# Patient Record
Sex: Female | Born: 1950
Health system: Southern US, Community
[De-identification: ages and names within clinical notes are randomized; demographics above are authoritative.]

## PROBLEM LIST (undated history)

## (undated) DIAGNOSIS — F32A Depression, unspecified: Secondary | ICD-10-CM

## (undated) DIAGNOSIS — I10 Essential (primary) hypertension: Secondary | ICD-10-CM

## (undated) DIAGNOSIS — E785 Hyperlipidemia, unspecified: Secondary | ICD-10-CM

## (undated) DIAGNOSIS — R7303 Prediabetes: Secondary | ICD-10-CM

## (undated) DIAGNOSIS — K219 Gastro-esophageal reflux disease without esophagitis: Secondary | ICD-10-CM

## (undated) DIAGNOSIS — M797 Fibromyalgia: Secondary | ICD-10-CM

## (undated) DIAGNOSIS — M1990 Unspecified osteoarthritis, unspecified site: Secondary | ICD-10-CM

## (undated) DIAGNOSIS — T148XXA Other injury of unspecified body region, initial encounter: Secondary | ICD-10-CM

## (undated) DIAGNOSIS — I2510 Atherosclerotic heart disease of native coronary artery without angina pectoris: Secondary | ICD-10-CM

## (undated) DIAGNOSIS — U071 COVID-19: Secondary | ICD-10-CM

## (undated) DIAGNOSIS — S301XXA Contusion of abdominal wall, initial encounter: Secondary | ICD-10-CM

## (undated) DIAGNOSIS — J189 Pneumonia, unspecified organism: Secondary | ICD-10-CM

## (undated) HISTORY — DX: Other injury of unspecified body region, initial encounter: T14.8XXA

## (undated) HISTORY — DX: Atherosclerotic heart disease of native coronary artery without angina pectoris: I25.10

## (undated) HISTORY — PX: CATARACT EXTRACTION: SUR2

## (undated) HISTORY — DX: Contusion of abdominal wall, initial encounter: S30.1XXA

## (undated) HISTORY — PX: KNEE ARTHROSCOPY: SUR90

## (undated) HISTORY — DX: Essential (primary) hypertension: I10

## (undated) HISTORY — PX: TONSILLECTOMY: SUR1361

## (undated) HISTORY — PX: CARDIAC CATHETERIZATION: SHX172

## (undated) HISTORY — PX: CHOLECYSTECTOMY: SHX55

## (undated) HISTORY — PX: ANGIOPLASTY: SHX39

## (undated) HISTORY — PX: CARPAL TUNNEL RELEASE: SHX101

## (undated) HISTORY — DX: Prediabetes: R73.03

## (undated) HISTORY — PX: ABDOMINAL HYSTERECTOMY: SUR658

## (undated) HISTORY — DX: Morbid (severe) obesity due to excess calories: E66.01

## (undated) HISTORY — PX: OTHER SURGICAL HISTORY: SHX169

## (undated) HISTORY — DX: Hyperlipidemia, unspecified: E78.5

## (undated) HISTORY — DX: Fibromyalgia: M79.7

## (undated) HISTORY — DX: Unspecified osteoarthritis, unspecified site: M19.90

## (undated) HISTORY — PX: BUNIONECTOMY: SHX129

---

## 1999-03-21 ENCOUNTER — Other Ambulatory Visit: Admission: RE | Admit: 1999-03-21 | Discharge: 1999-03-21 | Payer: Self-pay | Admitting: Family Medicine

## 1999-08-17 ENCOUNTER — Ambulatory Visit (HOSPITAL_COMMUNITY): Admission: RE | Admit: 1999-08-17 | Discharge: 1999-08-17 | Payer: Self-pay | Admitting: Family Medicine

## 1999-08-17 ENCOUNTER — Encounter: Payer: Self-pay | Admitting: Family Medicine

## 1999-08-22 ENCOUNTER — Encounter (INDEPENDENT_AMBULATORY_CARE_PROVIDER_SITE_OTHER): Payer: Self-pay | Admitting: Specialist

## 1999-08-22 ENCOUNTER — Other Ambulatory Visit: Admission: RE | Admit: 1999-08-22 | Discharge: 1999-08-22 | Payer: Self-pay | Admitting: Obstetrics and Gynecology

## 1999-11-09 ENCOUNTER — Encounter (INDEPENDENT_AMBULATORY_CARE_PROVIDER_SITE_OTHER): Payer: Self-pay | Admitting: Specialist

## 1999-11-09 ENCOUNTER — Ambulatory Visit (HOSPITAL_COMMUNITY): Admission: RE | Admit: 1999-11-09 | Discharge: 1999-11-09 | Payer: Self-pay | Admitting: Obstetrics and Gynecology

## 2000-07-10 ENCOUNTER — Inpatient Hospital Stay (HOSPITAL_COMMUNITY): Admission: RE | Admit: 2000-07-10 | Discharge: 2000-07-13 | Payer: Self-pay | Admitting: Obstetrics and Gynecology

## 2000-07-10 ENCOUNTER — Encounter (INDEPENDENT_AMBULATORY_CARE_PROVIDER_SITE_OTHER): Payer: Self-pay | Admitting: Specialist

## 2000-08-19 ENCOUNTER — Encounter: Payer: Self-pay | Admitting: Obstetrics and Gynecology

## 2000-08-19 ENCOUNTER — Ambulatory Visit (HOSPITAL_COMMUNITY): Admission: RE | Admit: 2000-08-19 | Discharge: 2000-08-19 | Payer: Self-pay | Admitting: Obstetrics and Gynecology

## 2001-07-18 ENCOUNTER — Inpatient Hospital Stay (HOSPITAL_COMMUNITY): Admission: EM | Admit: 2001-07-18 | Discharge: 2001-07-19 | Payer: Self-pay | Admitting: Emergency Medicine

## 2001-07-18 ENCOUNTER — Encounter: Payer: Self-pay | Admitting: Emergency Medicine

## 2001-09-18 ENCOUNTER — Encounter: Admission: RE | Admit: 2001-09-18 | Discharge: 2001-10-16 | Payer: Self-pay | Admitting: Family Medicine

## 2001-12-29 ENCOUNTER — Other Ambulatory Visit: Admission: RE | Admit: 2001-12-29 | Discharge: 2001-12-29 | Payer: Self-pay | Admitting: Obstetrics and Gynecology

## 2002-09-10 ENCOUNTER — Encounter: Payer: Self-pay | Admitting: Family Medicine

## 2002-09-10 ENCOUNTER — Ambulatory Visit (HOSPITAL_COMMUNITY): Admission: RE | Admit: 2002-09-10 | Discharge: 2002-09-10 | Payer: Self-pay | Admitting: Family Medicine

## 2003-07-01 ENCOUNTER — Encounter: Payer: Self-pay | Admitting: Family Medicine

## 2003-07-01 ENCOUNTER — Encounter: Admission: RE | Admit: 2003-07-01 | Discharge: 2003-07-01 | Payer: Self-pay | Admitting: Family Medicine

## 2003-07-07 ENCOUNTER — Ambulatory Visit (HOSPITAL_BASED_OUTPATIENT_CLINIC_OR_DEPARTMENT_OTHER): Admission: RE | Admit: 2003-07-07 | Discharge: 2003-07-07 | Payer: Self-pay | Admitting: *Deleted

## 2003-12-13 ENCOUNTER — Emergency Department (HOSPITAL_COMMUNITY): Admission: AD | Admit: 2003-12-13 | Discharge: 2003-12-13 | Payer: Self-pay | Admitting: Family Medicine

## 2004-01-06 ENCOUNTER — Emergency Department (HOSPITAL_COMMUNITY): Admission: AD | Admit: 2004-01-06 | Discharge: 2004-01-06 | Payer: Self-pay | Admitting: Family Medicine

## 2004-01-31 ENCOUNTER — Emergency Department (HOSPITAL_COMMUNITY): Admission: EM | Admit: 2004-01-31 | Discharge: 2004-01-31 | Payer: Self-pay | Admitting: Family Medicine

## 2004-07-11 ENCOUNTER — Ambulatory Visit (HOSPITAL_COMMUNITY): Admission: RE | Admit: 2004-07-11 | Discharge: 2004-07-11 | Payer: Self-pay | Admitting: *Deleted

## 2004-07-21 ENCOUNTER — Emergency Department (HOSPITAL_COMMUNITY): Admission: EM | Admit: 2004-07-21 | Discharge: 2004-07-21 | Payer: Self-pay | Admitting: Family Medicine

## 2004-12-17 ENCOUNTER — Emergency Department (HOSPITAL_COMMUNITY): Admission: EM | Admit: 2004-12-17 | Discharge: 2004-12-17 | Payer: Self-pay | Admitting: Family Medicine

## 2005-01-25 ENCOUNTER — Emergency Department (HOSPITAL_COMMUNITY): Admission: EM | Admit: 2005-01-25 | Discharge: 2005-01-25 | Payer: Self-pay | Admitting: Family Medicine

## 2005-02-27 ENCOUNTER — Encounter: Admission: RE | Admit: 2005-02-27 | Discharge: 2005-05-28 | Payer: Self-pay | Admitting: Family Medicine

## 2005-08-31 ENCOUNTER — Ambulatory Visit (HOSPITAL_COMMUNITY): Admission: RE | Admit: 2005-08-31 | Discharge: 2005-08-31 | Payer: Self-pay | Admitting: Family Medicine

## 2005-11-12 ENCOUNTER — Emergency Department (HOSPITAL_COMMUNITY): Admission: EM | Admit: 2005-11-12 | Discharge: 2005-11-12 | Payer: Self-pay | Admitting: Family Medicine

## 2005-12-27 ENCOUNTER — Emergency Department (HOSPITAL_COMMUNITY): Admission: EM | Admit: 2005-12-27 | Discharge: 2005-12-27 | Payer: Self-pay | Admitting: Family Medicine

## 2006-02-07 ENCOUNTER — Emergency Department (HOSPITAL_COMMUNITY): Admission: EM | Admit: 2006-02-07 | Discharge: 2006-02-07 | Payer: Self-pay | Admitting: Family Medicine

## 2006-04-04 ENCOUNTER — Emergency Department (HOSPITAL_COMMUNITY): Admission: EM | Admit: 2006-04-04 | Discharge: 2006-04-04 | Payer: Self-pay | Admitting: Family Medicine

## 2006-06-17 ENCOUNTER — Ambulatory Visit (HOSPITAL_BASED_OUTPATIENT_CLINIC_OR_DEPARTMENT_OTHER): Admission: RE | Admit: 2006-06-17 | Discharge: 2006-06-17 | Payer: Self-pay | Admitting: Orthopedic Surgery

## 2006-06-17 ENCOUNTER — Encounter (INDEPENDENT_AMBULATORY_CARE_PROVIDER_SITE_OTHER): Payer: Self-pay | Admitting: Orthopedic Surgery

## 2006-07-07 ENCOUNTER — Encounter: Payer: Self-pay | Admitting: Vascular Surgery

## 2006-07-07 ENCOUNTER — Emergency Department (HOSPITAL_COMMUNITY): Admission: EM | Admit: 2006-07-07 | Discharge: 2006-07-07 | Payer: Self-pay | Admitting: Emergency Medicine

## 2006-09-27 ENCOUNTER — Ambulatory Visit (HOSPITAL_COMMUNITY): Admission: RE | Admit: 2006-09-27 | Discharge: 2006-09-27 | Payer: Self-pay | Admitting: Family Medicine

## 2006-11-18 ENCOUNTER — Emergency Department (HOSPITAL_COMMUNITY): Admission: EM | Admit: 2006-11-18 | Discharge: 2006-11-18 | Payer: Self-pay | Admitting: Emergency Medicine

## 2006-12-01 ENCOUNTER — Emergency Department (HOSPITAL_COMMUNITY): Admission: EM | Admit: 2006-12-01 | Discharge: 2006-12-01 | Payer: Self-pay | Admitting: Family Medicine

## 2007-02-23 ENCOUNTER — Emergency Department (HOSPITAL_COMMUNITY): Admission: EM | Admit: 2007-02-23 | Discharge: 2007-02-23 | Payer: Self-pay | Admitting: Family Medicine

## 2007-03-26 ENCOUNTER — Ambulatory Visit (HOSPITAL_COMMUNITY): Admission: RE | Admit: 2007-03-26 | Discharge: 2007-03-26 | Payer: Self-pay | Admitting: Orthopedic Surgery

## 2007-04-03 ENCOUNTER — Encounter: Admission: RE | Admit: 2007-04-03 | Discharge: 2007-04-03 | Payer: Self-pay | Admitting: Orthopedic Surgery

## 2007-04-15 ENCOUNTER — Encounter: Admission: RE | Admit: 2007-04-15 | Discharge: 2007-04-15 | Payer: Self-pay | Admitting: Orthopedic Surgery

## 2007-05-07 ENCOUNTER — Emergency Department (HOSPITAL_COMMUNITY): Admission: EM | Admit: 2007-05-07 | Discharge: 2007-05-07 | Payer: Self-pay | Admitting: Emergency Medicine

## 2007-05-15 ENCOUNTER — Encounter: Admission: RE | Admit: 2007-05-15 | Discharge: 2007-05-15 | Payer: Self-pay | Admitting: Orthopedic Surgery

## 2007-06-25 ENCOUNTER — Emergency Department (HOSPITAL_COMMUNITY): Admission: EM | Admit: 2007-06-25 | Discharge: 2007-06-25 | Payer: Self-pay | Admitting: Family Medicine

## 2007-09-18 ENCOUNTER — Emergency Department (HOSPITAL_COMMUNITY): Admission: EM | Admit: 2007-09-18 | Discharge: 2007-09-18 | Payer: Self-pay | Admitting: Emergency Medicine

## 2007-09-20 ENCOUNTER — Emergency Department (HOSPITAL_COMMUNITY): Admission: EM | Admit: 2007-09-20 | Discharge: 2007-09-20 | Payer: Self-pay | Admitting: Emergency Medicine

## 2007-09-30 ENCOUNTER — Ambulatory Visit (HOSPITAL_COMMUNITY): Admission: RE | Admit: 2007-09-30 | Discharge: 2007-09-30 | Payer: Self-pay | Admitting: Family Medicine

## 2008-02-11 ENCOUNTER — Emergency Department (HOSPITAL_COMMUNITY): Admission: EM | Admit: 2008-02-11 | Discharge: 2008-02-12 | Payer: Self-pay | Admitting: Emergency Medicine

## 2008-03-11 ENCOUNTER — Ambulatory Visit (HOSPITAL_COMMUNITY): Admission: RE | Admit: 2008-03-11 | Discharge: 2008-03-11 | Payer: Self-pay | Admitting: Family Medicine

## 2008-03-12 ENCOUNTER — Ambulatory Visit (HOSPITAL_COMMUNITY): Admission: RE | Admit: 2008-03-12 | Discharge: 2008-03-12 | Payer: Self-pay | Admitting: Family Medicine

## 2008-04-13 ENCOUNTER — Emergency Department (HOSPITAL_COMMUNITY): Admission: EM | Admit: 2008-04-13 | Discharge: 2008-04-13 | Payer: Self-pay | Admitting: Emergency Medicine

## 2008-05-09 ENCOUNTER — Emergency Department (HOSPITAL_COMMUNITY): Admission: EM | Admit: 2008-05-09 | Discharge: 2008-05-09 | Payer: Self-pay | Admitting: Family Medicine

## 2008-06-19 ENCOUNTER — Emergency Department (HOSPITAL_COMMUNITY): Admission: EM | Admit: 2008-06-19 | Discharge: 2008-06-19 | Payer: Self-pay | Admitting: Family Medicine

## 2008-08-28 ENCOUNTER — Emergency Department (HOSPITAL_COMMUNITY): Admission: EM | Admit: 2008-08-28 | Discharge: 2008-08-28 | Payer: Self-pay | Admitting: Emergency Medicine

## 2008-10-19 ENCOUNTER — Ambulatory Visit (HOSPITAL_COMMUNITY): Admission: RE | Admit: 2008-10-19 | Discharge: 2008-10-20 | Payer: Self-pay | Admitting: General Surgery

## 2008-10-19 ENCOUNTER — Encounter (INDEPENDENT_AMBULATORY_CARE_PROVIDER_SITE_OTHER): Payer: Self-pay | Admitting: General Surgery

## 2008-12-29 ENCOUNTER — Encounter: Admission: RE | Admit: 2008-12-29 | Discharge: 2008-12-29 | Payer: Self-pay | Admitting: Family Medicine

## 2009-02-10 ENCOUNTER — Emergency Department (HOSPITAL_COMMUNITY): Admission: EM | Admit: 2009-02-10 | Discharge: 2009-02-10 | Payer: Self-pay | Admitting: Family Medicine

## 2009-03-02 ENCOUNTER — Ambulatory Visit (HOSPITAL_BASED_OUTPATIENT_CLINIC_OR_DEPARTMENT_OTHER): Admission: RE | Admit: 2009-03-02 | Discharge: 2009-03-02 | Payer: Self-pay | Admitting: Orthopedic Surgery

## 2009-05-20 ENCOUNTER — Emergency Department (HOSPITAL_COMMUNITY): Admission: EM | Admit: 2009-05-20 | Discharge: 2009-05-20 | Payer: Self-pay | Admitting: Emergency Medicine

## 2009-10-11 ENCOUNTER — Observation Stay (HOSPITAL_COMMUNITY): Admission: EM | Admit: 2009-10-11 | Discharge: 2009-10-12 | Payer: Self-pay | Admitting: Emergency Medicine

## 2009-10-11 ENCOUNTER — Telehealth (INDEPENDENT_AMBULATORY_CARE_PROVIDER_SITE_OTHER): Payer: Self-pay | Admitting: Radiology

## 2009-10-11 ENCOUNTER — Ambulatory Visit: Payer: Self-pay | Admitting: Cardiovascular Disease

## 2009-10-12 ENCOUNTER — Ambulatory Visit: Payer: Self-pay

## 2009-10-12 ENCOUNTER — Ambulatory Visit: Payer: Self-pay | Admitting: Cardiovascular Disease

## 2009-10-21 ENCOUNTER — Emergency Department (HOSPITAL_COMMUNITY): Admission: EM | Admit: 2009-10-21 | Discharge: 2009-10-21 | Payer: Self-pay | Admitting: Family Medicine

## 2009-11-06 ENCOUNTER — Emergency Department (HOSPITAL_COMMUNITY): Admission: EM | Admit: 2009-11-06 | Discharge: 2009-11-06 | Payer: Self-pay | Admitting: Family Medicine

## 2010-01-22 ENCOUNTER — Emergency Department (HOSPITAL_COMMUNITY): Admission: EM | Admit: 2010-01-22 | Discharge: 2010-01-22 | Payer: Self-pay | Admitting: Family Medicine

## 2010-01-27 ENCOUNTER — Encounter: Admission: RE | Admit: 2010-01-27 | Discharge: 2010-01-27 | Payer: Self-pay | Admitting: Family Medicine

## 2010-02-12 ENCOUNTER — Emergency Department (HOSPITAL_COMMUNITY): Admission: EM | Admit: 2010-02-12 | Discharge: 2010-02-12 | Payer: Self-pay | Admitting: Emergency Medicine

## 2010-04-21 ENCOUNTER — Emergency Department (HOSPITAL_COMMUNITY): Admission: EM | Admit: 2010-04-21 | Discharge: 2010-04-21 | Payer: Self-pay | Admitting: Emergency Medicine

## 2010-12-28 ENCOUNTER — Other Ambulatory Visit (HOSPITAL_COMMUNITY): Payer: Self-pay | Admitting: Family Medicine

## 2010-12-28 DIAGNOSIS — Z1231 Encounter for screening mammogram for malignant neoplasm of breast: Secondary | ICD-10-CM

## 2010-12-31 ENCOUNTER — Encounter: Payer: Self-pay | Admitting: Orthopedic Surgery

## 2011-01-29 ENCOUNTER — Ambulatory Visit (HOSPITAL_COMMUNITY): Payer: 59 | Attending: Family Medicine

## 2011-02-28 ENCOUNTER — Other Ambulatory Visit: Payer: Self-pay | Admitting: Family Medicine

## 2011-02-28 DIAGNOSIS — Z1231 Encounter for screening mammogram for malignant neoplasm of breast: Secondary | ICD-10-CM

## 2011-03-07 ENCOUNTER — Ambulatory Visit
Admission: RE | Admit: 2011-03-07 | Discharge: 2011-03-07 | Disposition: A | Discharge status: Home / Self Care | Payer: 59 | Source: Ambulatory Visit | Attending: Family Medicine | Admitting: Family Medicine

## 2011-03-07 DIAGNOSIS — Z1231 Encounter for screening mammogram for malignant neoplasm of breast: Secondary | ICD-10-CM

## 2011-03-14 LAB — GLUCOSE, CAPILLARY
Glucose-Capillary: 112 mg/dL — ABNORMAL HIGH (ref 70–99)
Glucose-Capillary: 115 mg/dL — ABNORMAL HIGH (ref 70–99)
Glucose-Capillary: 131 mg/dL — ABNORMAL HIGH (ref 70–99)

## 2011-03-14 LAB — POCT CARDIAC MARKERS
Myoglobin, poc: 52.6 ng/mL (ref 12–200)
Troponin i, poc: <0.05 ng/mL (ref 0.00–0.09)

## 2011-03-14 LAB — CARDIAC PANEL(CRET KIN+CKTOT+MB+TROPI)
CK, MB: 1.4 ng/mL (ref 0.3–4.0)
Total CK: 31 U/L (ref 7–177)
Troponin I: 0.01 ng/mL (ref 0.00–0.06)

## 2011-03-14 LAB — BASIC METABOLIC PANEL
BUN: 21 mg/dL (ref 6–23)
Calcium: 8.9 mg/dL (ref 8.4–10.5)
GFR calc non Af Amer: >60 mL/min (ref >60)

## 2011-03-14 LAB — POCT URINALYSIS DIP (DEVICE)
Bilirubin Urine: NEGATIVE
Nitrite: NEGATIVE
Specific Gravity, Urine: 1.015 (ref 1.005–1.030)
Urobilinogen, UA: 1 mg/dL (ref 0.0–1.0)
pH: 5 (ref 5.0–8.0)

## 2011-03-14 LAB — D-DIMER, QUANTITATIVE
D-Dimer, Quant: <0.22 ug/mL-FEU (ref 0.00–0.48)
D-Dimer, Quant: <0.22 ug/mL-FEU (ref 0.00–0.48)

## 2011-03-14 LAB — DIFFERENTIAL
Eosinophils Absolute: 0 10*3/uL (ref 0.0–0.7)
Eosinophils Relative: 0 % (ref 0–5)
Lymphs Abs: 2.3 10*3/uL (ref 0.7–4.0)
Monocytes Relative: 8 % (ref 3–12)

## 2011-03-14 LAB — LIPID PANEL
HDL: 45 mg/dL (ref >39)
Total CHOL/HDL Ratio: 3.5 RATIO

## 2011-03-14 LAB — CBC
Hemoglobin: 14.6 g/dL (ref 12.0–15.0)
MCHC: 35.4 g/dL (ref 30.0–36.0)
MCV: 86.6 fL (ref 78.0–100.0)
Platelets: 236 10*3/uL (ref 150–400)
WBC: 9.7 10*3/uL (ref 4.0–10.5)

## 2011-03-22 LAB — BASIC METABOLIC PANEL
BUN: 20 mg/dL (ref 6–23)
Chloride: 106 mEq/L (ref 96–112)
Creatinine, Ser: 0.74 mg/dL (ref 0.4–1.2)
GFR calc non Af Amer: >60 mL/min (ref >60)

## 2011-03-22 LAB — POCT HEMOGLOBIN-HEMACUE: Hemoglobin: 13.6 g/dL (ref 12.0–15.0)

## 2011-04-24 NOTE — Op Note (Signed)
Courtney Steele, Courtney Steele                 ACCOUNT NO.:  [id number]   MEDICAL RECORD NO.:  [id number]          PATIENT TYPE:  AMB   LOCATION:  DSC                          FACILITY:  [facility]   PHYSICIAN:  John L. Rendall, M.D.  DATE OF BIRTH:  Dec 15, 1950   DATE OF PROCEDURE:  03/02/2009  DATE OF DISCHARGE:                               OPERATIVE REPORT   INDICATION AND JUSTIFICATION FOR PROCEDURE:  Chronic numbness and  discomfort, right elbow and arm with the EMGs and NCVs showing severe  ulnar nerve compression at the elbow with both sensory and motor loss.   JUSTIFICATION FOR OUTPATIENT SETTING:  Inpatient not required.   PREOPERATIVE DIAGNOSIS:  Tardy ulnar palsy severe, right elbow.   SURGICAL PROCEDURE:  Release of ulnar nerve through cubital tunnel.   POSTOPERATIVE DIAGNOSIS:  Tardy ulnar palsy severe, right elbow.   SURGEON:  John L. Rendall, MD   ASSISTANT:  Legrand Pitts. Duffy, PAC, present and participating in the entire  procedure.   ANESTHESIA:  General.   PROCEDURE:  Under general anesthesia, the right arm was prepared with  DuraPrep and draped as a sterile field.  The tourniquet is applied  before the start of prep and drape for excellent hemostasis.  It is  elevated at 250 mm.  An incision is made centered over the cubital  tunnel just slightly medially and dissection is carried 2 inches  proximal and 2 inches distal to the cubital tunnel.  Dissection is done  with 2.5 power of magnification.  The subcutaneous tissue is sharply  divided down to fascia.  At this point, the fascia over the cubital  tunnel was opened just distal to it.  Dissection is carried down into  the flexor carpi ulnaris, and the nerve is found passing into the FCU.  A rubber band sterile is placed about the nerve to help mobilize.  Dissection is then carried proximally using the 2.5 power loupes and the  fascia over the cubital tunnel is released.  The nerve was found to be  severely compressed just as  it exits the tunnel and there is some  discoloration.  Going proximally, the nerve is down to about what I  would guessed to be half the normal size.  As it enters a cubital  tunnel, another compression is seen.  Once it is released within several  minutes, the nerve seems to expand by at least a third.  Dissection is  then carried proximally into the triceps following the nerve to assure  no other constrictions.  Care was taken to go distally further to make  sure none are  present in the FCU fascia.  Once this was completed, the deep subcu  tissue was closed over the nerve, which is left in the groove with 0  Vicryl, subcuticular 2-0 Vicryl, and skin clips, and a sterile dressing.  The patient returned to recovery in good condition.      John L. Rendall, M.D.  Electronically Signed     JLR/MEDQ  D:  03/02/2009  T:  03/02/2009  Job:  [id number]

## 2011-04-24 NOTE — Op Note (Signed)
NAMELASHAUNDRA, LEHRMANN                 ACCOUNT NO.:  [id number]   MEDICAL RECORD NO.:  [id number]          PATIENT TYPE:  OIB   LOCATION:  1621                         FACILITY:  [facility]   PHYSICIAN:  Sharlet Salina T. Hoxworth, M.D.DATE OF BIRTH:  01/09/1951   DATE OF PROCEDURE:  10/19/2008  DATE OF DISCHARGE:                               OPERATIVE REPORT   PREOPERATIVE DIAGNOSES:  1. Cholelithiasis.  2. Cholecystitis.   POSTOPERATIVE DIAGNOSIS:  1. Cholelithiasis.  2. Cholecystitis.   SURGICAL PROCEDURES:  Laparoscopic cholecystectomy with intraoperative  cholangiogram.   SURGEON:  Dr. Sharlet Salina T. Hoxworth.   ASSISTANT:  Almond Lint, MD   ANESTHESIA:  General.   BRIEF HISTORY:  Ms. Adelson  is a 60 year old female who presents with  intermittent right upper quadrant abdominal pain.  Workup has included a  CT scan of the abdomen showing a large gallstones.  She is felt to have  symptomatic cholelithiasis.  Laparoscopic cholecystectomy with  cholangiogram has been recommended and accepted.  This nature of the  procedure, indications, risks of bleeding, infection, bile leak, bile  duct injury and possible need for open procedure and anesthetic risks  were discussed and understood.  She is now brought to the operating room  for this procedure.   DESCRIPTION OF OPERATION:  The patient brought to the operating room and  placed in supine position on the operating table and general  endotracheal anesthesia was induced.  The abdomen was widely sterilely  prepped and draped.  Correct patient procedure were verified.  Local  anesthesia was used to infiltrate the trocar sites.  A 1 cm incision was  made at the umbilicus with dissection carried down to the midline fascia  which was sharply incised for 1 cm and the peritoneum entered under  direct vision.  Through a mattress suture 0-Vicryl the Hasson trocar was  placed and pneumoperitoneum established.  Under direct vision an 11 mm  trocar was  placed in the subxiphoid area and two 5 mm trocars along the  right subcostal margin.  There were noted to be extensive omental  adhesions up to the gallbladder covering the fundus.  These were  carefully stripped down with blunt dissection and cautery and the fundus  exposed and elevated.  There appeared to be severe chronic inflammation  of the gallbladder wall with very dense vascular adhesions of the  omentum to the gallbladder.  Staying on the gallbladder wall, these were  carefully taken down.  The adhesions were very vascular and there was  some moderate bleeding from the gallbladder wall and from the omentum as  this was taken down and this was controlled with clips and with cautery.  Dissection was very tedious and progressed very slowly, but gradually we  worked our way down the gallbladder to the infundibulum where there was  a very large stone which appeared to be partially eroding through the  wall of the gallbladder.  We were able to milk the stone back up into  the gallbladder and grasp the infundibulum with better exposure and  again gradually worked distally, staying right on  the gallbladder wall,  taking these very dense vascular adhesions of the omentum down.  Again,  after fairly long tedious dissection were able to come down to the  junction of the gallbladder and cystic duct and this seemed to taper  down to a normal size cystic duct which was dissected free over about a  centimeter and the cystic duct/gallbladder junction was dissected 360  degrees.  Cystic artery was identified to be coursing up on the  gallbladder wall as well.  When the anatomy appeared clear, the cystic  duct was clipped at the gallbladder junction and operative cholangiogram  obtained through the cystic duct.  This showed a normal common bile duct  and intrahepatic ducts with free flow into the duodenum and no filling  defects.  Following this, cholangiocath was removed and the cystic duct  was  triply clipped proximally and divided.  The gallbladder was then  dissected free from its bed using a hook cautery and the cystic artery  was triply clipped and divided.  Final attachments along the liver edge  were divided.  The gallbladder  was placed in EndoCatch bag and removed  through the umbilicus.  Following this right upper quadrant was  thoroughly irrigated and hemostasis appeared complete.  Surgicel pack  was left in the gallbladder bed.  Following this all CO2 evacuated and  trocars removed.  The mattress sutures were secured at the umbilicus.  Skin incisions were closed with subcuticular Monocryl and Dermabond.  Sponge, needle and instrument counts were correct.  The patient was  taken to recovery in condition.      Lorne Skeens. Hoxworth, M.D.  Electronically Signed     BTH/MEDQ  D:  10/19/2008  T:  10/19/2008  Job:  [id number]

## 2011-04-27 NOTE — Cardiovascular Report (Signed)
[place]  Patient:    Courtney Steele, Courtney Steele                  MRN: [id number] Proc. Date: 07/18/01 Adm. Date:  [id number] Attending:  Talitha Givens CC:         Gretta Arab. Valentina Lucks, M.D.  Luis Abed, M.D. [facility]   Cardiac Catheterization  DATE OF BIRTH:  07/08/1951  REFERRING PHYSICIAN:  Gretta Arab. Valentina Lucks, M.D.  CARDIOLOGIST:  Luis Abed, M.D.  PROCEDURES PERFORMED: 1. Left heart catheterization with selective coronary angiography. 2. Ventriculography.  INDICATIONS:  ICD-9 code 786.51.  DIAGNOSES: 1. No significant flow-limiting epicardial coronary artery disease. 2. Normal left ventricular systolic function.  HISTORY:  The patient is a 60 year old female, who presented with prolonged substernal chest pain to the emergency room.  Initial electrocardiogram and troponins were within normal limits.  However, the patient continues to have severe substernal chest pain on nitroglycerin and IV heparin and diagnostic catheterization was recommended to assess the coronary anatomy.  DESCRIPTION OF PROCEDURE:  After informed consent was obtained, the patient was brought to the catheterization laboratory.  The right groin was sterilely prepped and draped.  Lidocaine 1% was injected in the right groin area to provide local anesthesia.  Subsequently, a 6 French arterial sheath was placed using modified Seldinger technique.  A 6 [place] and JR4 catheters were then used to engage the left and right coronary arteries, respectively. Selective coronary angiography was performed in various projections using manual injection of contrast.  After elective coronary angiography, a 6 French pigtail catheter was placed in the left ventricular cavity and appropriate left-sided hemodynamics obtained.  Ventriculography was then performed in a single plane RAO projection using power injection of contrast.  At the termination of the case, all catheters  and sheath were removed and the patient was brought back to the holding area.  The ACT level was checked prior to removing the sheath.  No complications were noted.  FINDINGS:  HEMODYNAMICS:  Left ventricular pressure 107/13 mmHg.  The aortic pressure 107/15 mmHg.  VENTRICULOGRAPHY:  Ejection fraction 65%.  No segmental wall motion abnormalities.  No mitral regurgitation.  SELECTIVE CORONARY ANGIOGRAPHY: 1. Left main coronary artery was a large caliber vessel with no evidence    of flow-limiting coronary artery disease. 2. Left anterior descending artery was a large caliber vessel with mild    plaquing of 20% prior to the takeoff of the first diagonal branch.  The    first diagonal branch itself appeared to have an extension of atheromatous    plaque from the LAD with 40% narrowing at the origin of the diagonal    vessel.  The second diagonal branch was free of flow-limiting coronary    artery disease.  The third diagonal branch was a trivial branch. 3. Left circumflex coronary artery was a large caliber vessel with no evidence    of flow-limiting coronary artery disease. 4. Right coronary artery was a large caliber vessel with no evidence of    flow-limiting coronary artery disease.  RECOMMENDATIONS:  The results have been discussed with the patient and her family.  Dr. Myrtis Ser was notified of these findings.  The patient will need aggressive risk factor modification.  The D-dimer level will be checked to rule out the possibility of pulmonary embolism.  Spasm in the distribution of the diagonal branch is not excluded, and further therapy with aspirin and nitrates or calcium channel blocker may  be indicated. DD:  07/18/01 TD:  07/19/01 Job: [id number]

## 2011-04-27 NOTE — Op Note (Signed)
[facility]  Patient:    Courtney Steele, Courtney Steele                  MRN: [id number] Proc. Date: 07/10/00 Adm. Date:  [id number] Attending:  Lenoard Aden CC:         [facility]                           Operative Report  PREOPERATIVE DIAGNOSES:  Symptomatic pelvic relaxation, cystocele, rectocele, enterocele, pelvic pain and genuine stress incontinence.  POSTOPERATIVE DIAGNOSES:  Symptomatic pelvic relaxation, cystocele, rectocele, enterocele, pelvic pain and genuine stress incontinence, severe pelvic adhesions from severe endometriosis.  PROCEDURE:  Diagnostic laparoscopy, total abdominal hysterectomy, BSO, right ureterolysis, lysis of adhesions, anterior colporrhaphy, posterior colporrhaphy, pubovaginal sling by Dr. Isabel Caprice dictated separately, Durwin Nora and perineorrhaphy.  SURGEON:  Dr. Billy Coast.  ASSISTANT:  Dr. Earlene Plater.  ANESTHESIA:  General.  ESTIMATED BLOOD LOSS:  2000 cc. Intraoperatively the patient received 2 units of packed red blood cells.  COMPLICATIONS:  None.  DRAINS:  Vaginal pack, suprapubic catheter.  DISPOSITION:  To recovery room in good condition.  DESCRIPTION OF PROCEDURE:  After being apprised of anesthesia, infection, bleeding, intra-abdominal organs need for repair, in addition the patient is apprised of the fact that due to starting hemoglobin of 9.6, she is at significant risk for receiving blood transfusion. She is apprised of the risks and benefits of receiving blood and acknowledges and will proceed if needed. She is apprised of delayed versus immediate complications of laparoscopy to include significant injury to bowel or bladder with need for repair. At this time, she is brought to the operating room and placed under general anesthesia, prepped and draped in the usual sterile fashion. Her feet are placed in the Van Horne stirrups, a speculum placed per vagina. A bulky retroflexed uterus, no adnexal masses  appreciated. A grade 2-3 cystocele, grade 2 rectocele and enterocele are noted. At this time a Hulka tenaculum placed per vagina. Infraumbilical incision made with the scalpel, Foley catheter placed, Veress needle placed, opening pressure of minus one noted. Atraumatic trocar entry made without difficulty after a Veress needle is placed and 4 liters of CO2 insufflated without difficulty. Trocar placement is atraumatic. Pictures taken. Visualization reveals a bulky uterus, unable to visualize the left tube and ovary because they appear tucked under the uterus. Therefore two 5 mm trocar sites are made in the bilateral lower quadrants and graspers and tripolar are rendered. The left infundibulopelvic ligament is identified after seeing the ureter and cauterized and divided. The round ligament cauterized and divided. Uterine vessels noted on the left. Attention turned to the right after round ligament divided and retroperitoneal space entered. Attention is then made down to the right ovary which is firmly adhesed to the ureter in the pelvic side wall. At this time due to the inability to dissect out the ureter due to the bulkiness of the uterus and the adhesions to the right ovary attempts are aborted and attention is turned to the abdominal portion of the case whereby a Pfannenstiel skin incision is made with the scalpel, carried down to the fascia which is nicked in the midline and opened transversely using Mayo scissors. The rectus muscle was dissected sharply in the midline, peritoneum entered sharply, bowel contents packed off. Fascial closure of the infraumbilical incision is done without difficulty. At this time, a Engineer, manufacturing was placed. A bulky uterus is noted. It is elevated using  Kelly clamps laterally. The retroperitoneal space is then entered on the right. The uterine vessels were skeletonized, bilaterally clamped and suture ligated. On the left side, the tubo-ovarian round  ligament is further grasped and suture ligated and uterine vessels skeletonized, bilaterally clamped, suture ligated, ureters identified bilaterally. Partial specimen removed of the uterus. Attention is then turned to the right adnexa where the ovary is firmly adhesed to the pelvic side wall. The ureter is dissected out and tunneled out on the right using a right angled clamp down to the level of the mass whereby the peritoneum is then gently removed off the medial portion of the ureter and the mass is clearly identified separate from the ureter is clamped and the right ovary and tube clamped using a right angled clamp, removed and suture ligation using #0 Vicryl suture is done. Attention is then turned to the cervix whereby the cardinal and uterosacral ligaments are bilaterally clamped, suture ligated and held. A specimen of the cervix is then removed. The vagina closed using #0 Vicryl sutures, modified Richardson angled sutures are placed, transection in the uterosacral ligaments. The uterosacrals are then plicated in the midline and a #0 Vicryl was used to place a McCall culdoplasty stitch after I identified the ureters bilaterally. Irrigation is accomplished due to 2 liter blood loss during the surgery. The patient is transfused intraoperatively 2 units of packed red blood cells. At this time good hemostasis is achieved after irrigation is accomplished. The uterosacrals are plicated in the midline in addition to placing the McCall culdoplasty suture the fascia then closed after removal of the retractor and packings. Counts correct. A #0 Vicryl was used to close the fascia. The skin was left open for Dr. Isabel Caprice to come place his pubovaginal sling. Attention turned to the vaginal portion of the procedure. The cystocele was identified. Apex is identified. The vaginal mucosa is opened and the perivesical fascia is dissected off of the anterior vaginal wall. At this time, the cystocele is  reduced sharply using Strully scissors and reduced using #0 Vicryl imbricating sutures. The vagina is closed with 2-0 Vicryl  interrupted sutures leaving a space in the cephalad portion of opening for placement of the pubovaginal sling. A large enterocele was then identified posteriorly. The apex was identified using the Kelly clamp after placement of the finger per rectum. The perirectal fascia is then dissected off the posterior vaginal mucosa. This defect is then sharply dissected and reduced and closed using a #0 Vicryl suture. The vagina is trimmed, closed using a 2-0 Vicryl with interrupted sutures and then a continued running suture is used to placed the perineum. Perineorrhaphy is performed in a standard fashion using 2-0 Vicryl suture. Closed, good hemostasis achieved. Dr. Isabel Caprice then proceeded to place his pubovaginal sling which will be dictated separately. s DD:  07/10/00 TD:  07/11/00 Job: [id number]

## 2011-04-27 NOTE — Op Note (Signed)
NAMECLARITY, CISZEK                 ACCOUNT NO.:  [id number]   MEDICAL RECORD NO.:  [id number]          PATIENT TYPE:  AMB   LOCATION:  DSC                          FACILITY:  [facility]   PHYSICIAN:  John L. Rendall, M.D.  DATE OF BIRTH:  Mar 18, 1951   DATE OF PROCEDURE:  06/17/2006  DATE OF DISCHARGE:                                 OPERATIVE REPORT   PREOPERATIVE DIAGNOSIS:  Torn lateral meniscus with osteoarthritis.   SURGICAL PROCEDURES:  Arthroscopic lateral meniscectomy plus tricompartment  chondroplasty and synovial biopsy.   POSTOPERATIVE DIAGNOSIS:  Tricompartmental osteoarthritis with complex tear  lateral meniscus and significant synovitis.   SURGEON:  John L. Rendall, M.D.   ANESTHESIA:  MAC.   PROCEDURE:  Under MAC anesthesia, the right knee is prepared with DuraPrep  and draped as a sterile field with a leg holder.  Standard portals were made  and findings of a complex tear of the lateral meniscus, peeling hyaline  cartilage, the anterolateral weightbearing lateral femoral condyle over the  torn cartilage were found, peeling hyaline cartilage grade 3 and lateral  patellar facette and central femoral trochlea and at the medial margin of  the medial femoral condyle.  In addition, there was significant synovitis  throughout the suprapatellar pouch.  Medial meniscus was intact following  the look about, chondroplasty of the patella and femoral trochlea was done  through both medial and lateral portals.  It was necessary to resect a  considerable synovitis from the suprapatellar pouch simply to get the  irrigation system working properly.  Once this was resected and a portion  taken for synovial biopsy with pituitary, attention was turned to the  lateral meniscus and using a combination of curved Marlen shaver, curved  left baskets and 90 degrees baskets, the torn cartilage was resected back to  stable meniscal rim.  Once this was done, knee is further examined.  Some  chondromalacia along the margin of the medial femoral condyle was trimmed.  The knee is then copiously irrigated with saline, infiltrated with Marcaine  with morphine with epinephrine and a sterile compression bandage was  applied.  The patient returned to recovery in good condition.      John L. Rendall, M.D.  Electronically Signed     JLR/MEDQ  D:  06/17/2006  T:  06/17/2006  Job:  [id number]

## 2011-04-27 NOTE — H&P (Signed)
[facility]  Patient:    Courtney Steele                   MRN: [id number] Adm. Date:  [id number] Attending:  Lenoard Aden CC:         [facility]                         History and Physical  PREOPERATIVE DIAGNOSES: 1. Symptomatic pelvic relaxation with cystocele, rectocele, enterocele. 2. Genuine stress incontinence and pelvic pain.  HISTORY OF PRESENT ILLNESS:  The patient is a 60 year old white female with a history of aforementioned symptomatic pelvic relaxation having been evaluated by Dr. Isabel Caprice who suggests being proceeding with tubovaginal sling in conjunction with pelvic relaxation surgery, hysterectomy, and bilateral salpingo-oophorectomy.  ALLERGIES:  PENICILLIN and ERYTHROMYCIN.  MEDICATIONS:  Celebrex.  MEDICAL HISTORY:  Remarkable for reactive airways disease and vaginal deliveries.                                                            /  SOCIAL HISTORY:  She is a nonsmoker, nondrinker.  She denies domestic or physical violence.  PHYSICAL EXAMINATION:  GENERAL:  The patient is slightly obese white female.  No acute distress.  HEENT: Normal.  LUNGS:  Clear.  HEART:  Regular rhythm.  ABDOMEN:  Soft, obese, nontender.  PELVIC:  Reveals a grade 2 to 3 cystocele, grade 1 uterine descensus, grade 1-2 rectocele, questionable enterocele as well as relaxed peritoneum.  EXTREMITIES:  Reveal no cords.  NEUROLOGIC:  Nonfocal.  IMPRESSION: 1. Symptomatic pelvic relaxation with cystocele, rectocele, enterocele. 2. Pelvic pain. 3. Genuine stress incontinence.  PLAN:  Proceed with laparoscopically assisted vaginal hysterectomy, bilateral salpingo-oophorectomy, anterior colporrhaphy, posterior colporrhaphy, rectocele or enterocele repair, perineorrhaphy, tubovaginal sling to be performed separately by Dr. Isabel Caprice.  The patient is apprised of the risks of anesthesia, infection, bleeding, injury to abdominal organs and  need for repair.  Possible delayed versus immediate complications to include bowel and bladder injury are noted.  She is apprised of the fact that her preoperative hemoglobin is 9.6, and the possible need for blood transfusion.  She acknowledges these risks and will accept transfusion as necessary.  The patient declined postponing surgery at this time. DD:  07/10/00 TD:  07/10/00 Job: [id number]

## 2011-04-27 NOTE — Discharge Summary (Signed)
[place]. [facility]  Patient:    Courtney Steele, Courtney Steele                  MRN: [id number] Adm. Date:  [id number] Disc. Date: 07/19/01 Attending:  Talitha Givens Dictator:   Tereso Newcomer, P.A.-C. CC:         Gretta Arab. Valentina Lucks, M.D.   Discharge Summary  DATE OF BIRTH:  Dec 13, 1950  DISCHARGE DIAGNOSES: 1. Chest pain, etiology unclear. 2. Hysterectomy in August of 2001. 3. Seasonal allergies. 4. Degenerative disk disease, L3 through S1. 5. Bone spur, left shoulder. 6. Status post tonsillectomy in 1980. 7. Hearing loss of 20% secondary to viral illness. 8. History of hiatal hernia.  PROCEDURES PERFORMED:  Cardiac catheterization by Dr. Andee Lineman on July 18, 2001, revealing left main without flow-limiting CAD, LAD with about 20% mid stenosis, diagonal #1 with a 40% at the origin.  Left circumflex with normal flow-limiting CAD and RCA normal.  EF of 65%.  No MR.  No wall motion abnormalities.  HOSPITAL COURSE:  This 60 year old female, was seen in the emergency room on July 18, 2001, with complaints of chest pain.  She has had about a one-year history of chest pain that lasts a few seconds at a time.  She had similar episode of chest pain while driving home on the night prior to admission.  Her episode lasted about five minutes, and was spontaneously remitted.  On the morning of admission she had recurrence of pain after awakening.  It was associated with dizziness, nausea and radiation up to her neck and between her shoulder blades but no associated shortness of breath.  She had an aspirin x4 and went to work where her symptoms persisted.  A coworker noted she was clammy.  She came to the emergency room and was treated with nitroglycerin and her pain resolved.  On initial evaluation, her blood pressure was 125/69, pulse 75.  Chest: Clear.  Cardiac:  Regular rate and rhythm, normal S1, S2, soft 1/6 systolic murmur.  No rubs nor gallops.  Abdomen:   Soft, nontender.  Extremity without clubbing, cyanosis, or edema.  ECG revealed incomplete right bundle-branch block, rate 83, normal sinus rhythm.  No ST segment changes.  Chest x-ray:  No active disease.  The patient was admitted and placed on heparin, nitroglycerin and aspirin and low-dose beta blocker.  Her symptoms were concerning for ischemia and she was setup for cardiac catheterization to evaluate.  She ruled out for MI by enzymes x2.  She went for cardiac catheterization on the same day by Dr. Andee Lineman.  The results are noted above.  Given the noncritical nature of her CAD, and her normal LVEF, it was felt that D-dimer should be checked to rule out possibility of pulmonary embolism.  This was negative at 0.36.  The patient was started on proton pump inhibitor empirically.  On the morning of July 19, 2001, the patient was seen by Dr. Myrtis Ser and she was doing well and felt stable enough for discharge to home.  She would require followup in 3-4 weeks with Dr. Myrtis Ser.  LABORATORY DATA:  White blood cell count 6300, hemoglobin 11.7, hematocrit 32.9, platelet count 250,000.  INR 1.0, sodium 141, potassium 3.1, chloride 106, CO2 29, glucose 108, BUN 18, creatinine 0.8.  Total bilirubin 1.0. Alkaline phosphatase 55, SGOT 27, SGPT 24.  Total protein 6.9, albumin 3.8, calcium 9.1. Cardiac enzymes negative x2.  Lipid profile pending at the time of this dictation.  DISCHARGE MEDICATIONS:  Protonix 40 mg a day.  ACTIVITY:  No driving, heavy lifting, exertion or sex for three days.  DIET:  Low fat, low sodium.  SPECIAL INSTRUCTIONS:  She is to call our office for any groin swelling, bleeding or bruising.  She will see Dr. Myrtis Ser in 3-4 weeks and the office should call her with an appointment. DD:  07/19/01 TD:  07/19/01 Job: [id number]

## 2011-04-27 NOTE — Op Note (Signed)
   Courtney Steele, Courtney Steele                     ACCOUNT NO.:  [id number]   MEDICAL RECORD NO.:  [id number]                   PATIENT TYPE:  AMB   LOCATION:  DSC                                  FACILITY:  [facility]   PHYSICIAN:  Lowell Bouton, M.D.      DATE OF BIRTH:  [birth year]   DATE OF PROCEDURE:  07/07/2003  DATE OF DISCHARGE:                                 OPERATIVE REPORT   PREOPERATIVE DIAGNOSIS:  Right carpal tunnel syndrome.   POSTOPERATIVE DIAGNOSIS:  Right carpal tunnel syndrome.   OPERATION PERFORMED:  Decompression of median nerve, right carpal tunnel.   SURGEON:  Lowell Bouton, M.D.   ANESTHESIA:  0.5% Marcaine local with sedation.   OPERATIVE FINDINGS:  The patient had no masses in the carpal canal and the  motor branch of the nerve was intact.   DESCRIPTION OF PROCEDURE:  Under 0.5% Marcaine local anesthesia with a  tourniquet on the right arm, the right hand was prepped and draped in the  usual fashion and after exsanguinating the limb, the tourniquet was inflated  to 250 mmHg.  A 3 cm longitudinal incision was made in the palm just ulnar  to the thenar crease.  Sharp dissection was carried through the subcutaneous  tissues and bleeding points were coagulated.  Blunt dissection was carried  through the superficial palmar fascia distal to the transverse carpal  ligament.  A hemostat was placed in the carpal canal up against the hook of  the hamate and the transverse carpal ligament was divided on the ulnar  border of the median nerve.  The proximal end of the ligament was divided  with the scissors after dissecting the nerve away from the undersurface of  the ligament.  The carpal canal was then palpated and was found to be  adequately decompressed.  The nerve was examined and the motor branch  identified.  The wound was then irrigated with saline.  The skin was closed  with 4-0 nylon suture.  Sterile dressings were applied followed by a  volar  wrist splint.  The patient tolerated the procedure well and went to the  recovery room awake and stable in  good condition.                                                 Lowell Bouton, M.D.    EMM/MEDQ  D:  07/07/2003  T:  07/07/2003  Job:  [id number]

## 2011-04-27 NOTE — Op Note (Signed)
[facility]  Patient:    Courtney Steele, Courtney Steele                  MRN: [id number] Proc. Date: 07/10/00 Adm. Date:  [id number] Attending:  Lenoard Aden CC:         Lenoard Aden, M.D.                           Operative Report  PREOPERATIVE DIAGNOSIS:  Stress urinary incontinence.  POSTOPERATIVE DIAGNOSIS:  Stress urinary incontinence.  PROCEDURE PERFORMED:  Pubovaginal sling, suprapubic tube placement, and flexible cystoscopy.  SURGEON:  Barron Alvine, M.D.  ANESTHESIA:  INDICATIONS:  The patient is a 60 year old female.  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  She has had extensive pelvic relaxation and dysfunctional uterine bleeding.  Lenoard Aden, M.D. had planned anterior and posterior repair, as well as hysterectomy.  The patient also complained of stress urinary incontinence, and he asked [place] to evaluate her for the possibility of concurrent pubovaginal sling.  We evaluated her and felt that she was a reasonable candidate for that.  The advantages and disadvantages of approach, as well as all the potential complications and issues with regard to sling were discussed with her.  Full informed consent was obtained.  DESCRIPTION OF PROCEDURE AND FINDINGS:  When we entered the room, Dr. Billy Coast had finished his portion of the operation which will be dictated separately. This included an attempt at laparoscopic assisted hysterectomy.  Some bleeding was encountered.  At the time we saw the patient, blood loss was approximately 2000 cc.  The patient had remained completely stable hemodynamically. Dr. Billy Coast had finished his hysterectomy, as well as the anterior and posterior repairs.  On the anterior repair, he had finished reduction of the cystocele and had trimmed the vaginal mucosa.  The incision was left open for placement of pubovaginal sling.  The patient again was stable.  She had an opened Pfannenstiel incision.   The fascia had been closed, but the skin had been left open.  We felt that the addition of a pubovaginal sling would only add approximately 15 minutes to the operative time considering that the vaginal incision would already have to be closed, as would the abdominal incision, and the only additional time involved would be placement of the sling underneath the bladder neck which again required minimal time, and again, the patient had remained stable.  There was no significant bleeding at the time we took over the case, although there was some very minimal vaginal oozing.  The cystocele had already been reduced.  It was very easy to place a finger along side the bladder and up through the retropubic space.  Those tissues were noted to be significantly attenuated and the retropubic space was rendered again with just simple digital fingertip dissection.  The sling itself was made with a piece of cadaveric fascia lata measuring 2.5 x 8 cm. This was anchored on both ends with a #1 nylon suture.  Through the open Pfannenstiel incision, we were able to place a tonsil clamp underneath the pubic symphysis.  With finger in the right retropubic space, we were able to fill the clamp and direct it out the vaginal incision.  The nylon suture was grabbed and brought out the incision.  The same thing was done on the left side.  The sling was then positioned at the bladder neck and pexed in an  open position with 3-0 Vicryl suture.  Flexible cystoscopy was then performed.  The sling appeared to be well-positioned at the bladder neck. Blue dye could be seen from both ureteral orifices.  The suprapubic tube was placed under direct visual guidance.  The suprapubic tube was placed superior to the Pfannenstiel incision.  The vaginal mucosa was then closed.  Again, the tissues had already been trimmed by Dr. Billy Coast and we simply closed the opened vaginal incision with a running 2-0 Vicryl suture.  Hemostasis was  quite good at that point.  Some vaginal packing was applied.  Attention was then turned towards the sling in the suprapubic incision.  It was copiously irrigated with antibiotic solution.  Marcaine was infiltrated to help with postoperative analgesia.  The subcutaneous tissues were reapproximated to reduce dead space with some interrupted Vicryl suture.  The skin was then closed with surgical clips.  The suprapubic tube was left to straight drainage.  The patient was brought to the recovery room in stable condition. DD:  07/10/00 TD:  07/11/00 Job: [id number]

## 2011-04-27 NOTE — Discharge Summary (Signed)
[facility]  Patient:    Courtney Steele, Courtney Steele                  MRN: [id number] Adm. Date:  [id number] Disc. Date: [id number] Attending:  Lenoard Aden                           Discharge Summary  PROCEDURES:  The patient underwent diagnostic laparoscopy, TAH-BSO, ureterolysis, anterior posterior colporrhaphy, pubovaginal sling, McCall culdoplasty, and perineorrhaphy on July 10, 2000.  HOSPITAL COURSE: Complicated by the need for transfusion which was given in the recovery room.  The patients postoperative hemoglobin after transfusion was 10.4.  She tolerated a regular diet well.  On postop day #1, hemoglobin and hematocrit stable.  DISPOSITION: Discharged to home on postop day #3.  Tylox given for pain.  Discharge teaching done.  Follow up in the office in four to six weeks.  Follow up with Dr. Isabel Caprice as scheduled. DD:  08/15/00 TD:  08/17/00 Job: [id number]

## 2011-07-05 ENCOUNTER — Emergency Department (HOSPITAL_COMMUNITY): Payer: 59

## 2011-07-05 ENCOUNTER — Inpatient Hospital Stay (HOSPITAL_COMMUNITY)
Admission: EM | Admit: 2011-07-05 | Discharge: 2011-07-08 | DRG: 247 | Disposition: A | Discharge status: Home / Self Care | Payer: 59 | Attending: Cardiology | Admitting: Cardiology

## 2011-07-05 DIAGNOSIS — D62 Acute posthemorrhagic anemia: Secondary | ICD-10-CM | POA: Diagnosis not present

## 2011-07-05 DIAGNOSIS — IMO0002 Reserved for concepts with insufficient information to code with codable children: Secondary | ICD-10-CM | POA: Diagnosis not present

## 2011-07-05 DIAGNOSIS — Z6830 Body mass index (BMI) 30.0-30.9, adult: Secondary | ICD-10-CM

## 2011-07-05 DIAGNOSIS — I2510 Atherosclerotic heart disease of native coronary artery without angina pectoris: Principal | ICD-10-CM | POA: Diagnosis present

## 2011-07-05 DIAGNOSIS — F3289 Other specified depressive episodes: Secondary | ICD-10-CM | POA: Diagnosis present

## 2011-07-05 DIAGNOSIS — M1990 Unspecified osteoarthritis, unspecified site: Secondary | ICD-10-CM | POA: Diagnosis present

## 2011-07-05 DIAGNOSIS — E119 Type 2 diabetes mellitus without complications: Secondary | ICD-10-CM | POA: Diagnosis present

## 2011-07-05 DIAGNOSIS — Y9210 Unspecified residential institution as the place of occurrence of the external cause: Secondary | ICD-10-CM | POA: Diagnosis not present

## 2011-07-05 DIAGNOSIS — IMO0001 Reserved for inherently not codable concepts without codable children: Secondary | ICD-10-CM | POA: Diagnosis present

## 2011-07-05 DIAGNOSIS — Y840 Cardiac catheterization as the cause of abnormal reaction of the patient, or of later complication, without mention of misadventure at the time of the procedure: Secondary | ICD-10-CM | POA: Diagnosis not present

## 2011-07-05 DIAGNOSIS — I200 Unstable angina: Secondary | ICD-10-CM

## 2011-07-05 DIAGNOSIS — I10 Essential (primary) hypertension: Secondary | ICD-10-CM | POA: Diagnosis present

## 2011-07-05 DIAGNOSIS — E785 Hyperlipidemia, unspecified: Secondary | ICD-10-CM | POA: Diagnosis present

## 2011-07-05 DIAGNOSIS — Z880 Allergy status to penicillin: Secondary | ICD-10-CM

## 2011-07-05 DIAGNOSIS — F329 Major depressive disorder, single episode, unspecified: Secondary | ICD-10-CM | POA: Diagnosis present

## 2011-07-05 DIAGNOSIS — E669 Obesity, unspecified: Secondary | ICD-10-CM | POA: Diagnosis present

## 2011-07-05 DIAGNOSIS — Z8249 Family history of ischemic heart disease and other diseases of the circulatory system: Secondary | ICD-10-CM

## 2011-07-05 LAB — CBC
MCV: 85.4 fL (ref 78.0–100.0)
Platelets: 184 10*3/uL (ref 150–400)
RDW: 14.7 % (ref 11.5–15.5)
WBC: 6.5 10*3/uL (ref 4.0–10.5)

## 2011-07-05 LAB — DIFFERENTIAL
Eosinophils Absolute: 0.2 10*3/uL (ref 0.0–0.7)
Eosinophils Relative: 3 % (ref 0–5)
Lymphs Abs: 2.7 10*3/uL (ref 0.7–4.0)

## 2011-07-05 LAB — BASIC METABOLIC PANEL
BUN: 21 mg/dL (ref 6–23)
CO2: 28 mEq/L (ref 19–32)
Chloride: 105 mEq/L (ref 96–112)
Glucose, Bld: 94 mg/dL (ref 70–99)
Potassium: 3.5 mEq/L (ref 3.5–5.1)

## 2011-07-05 LAB — PROTIME-INR
INR: 1.08 (ref 0.00–1.49)
Prothrombin Time: 14.2 seconds (ref 11.6–15.2)

## 2011-07-05 LAB — CK TOTAL AND CKMB (NOT AT ARMC)
CK, MB: 2.7 ng/mL (ref 0.3–4.0)
CK, MB: 2.5 ng/mL (ref 0.3–4.0)
Relative Index: INVALID (ref 0.0–2.5)

## 2011-07-05 LAB — APTT
aPTT: 29 seconds (ref 24–37)
aPTT: 66 seconds — ABNORMAL HIGH (ref 24–37)

## 2011-07-06 DIAGNOSIS — I2510 Atherosclerotic heart disease of native coronary artery without angina pectoris: Secondary | ICD-10-CM

## 2011-07-06 LAB — LIPID PANEL
HDL: 41 mg/dL (ref >39)
LDL Cholesterol: 68 mg/dL (ref 0–99)
Total CHOL/HDL Ratio: 3.6 RATIO
Triglycerides: 184 mg/dL — ABNORMAL HIGH (ref <150)

## 2011-07-06 LAB — COMPREHENSIVE METABOLIC PANEL
AST: 21 U/L (ref 0–37)
Albumin: 3.3 g/dL — ABNORMAL LOW (ref 3.5–5.2)
Calcium: 9.1 mg/dL (ref 8.4–10.5)
Creatinine, Ser: 0.73 mg/dL (ref 0.50–1.10)
GFR calc non Af Amer: >60 mL/min (ref >60)
Total Protein: 5.8 g/dL — ABNORMAL LOW (ref 6.0–8.3)

## 2011-07-06 LAB — CARDIAC PANEL(CRET KIN+CKTOT+MB+TROPI)
CK, MB: 2.3 ng/mL (ref 0.3–4.0)
Relative Index: INVALID (ref 0.0–2.5)
Total CK: 57 U/L (ref 7–177)
Troponin I: <0.3 ng/mL (ref <0.30)
Troponin I: <0.3 ng/mL (ref <0.30)

## 2011-07-06 LAB — GLUCOSE, CAPILLARY: Glucose-Capillary: 103 mg/dL — ABNORMAL HIGH (ref 70–99)

## 2011-07-06 LAB — PROTIME-INR: INR: 1 (ref 0.00–1.49)

## 2011-07-07 DIAGNOSIS — T82598A Other mechanical complication of other cardiac and vascular devices and implants, initial encounter: Secondary | ICD-10-CM

## 2011-07-07 LAB — CBC
Hemoglobin: 11.4 g/dL — ABNORMAL LOW (ref 12.0–15.0)
MCH: 28.7 pg (ref 26.0–34.0)
MCV: 86.6 fL (ref 78.0–100.0)
Platelets: 152 10*3/uL (ref 150–400)
RBC: 3.97 MIL/uL (ref 3.87–5.11)

## 2011-07-07 LAB — BASIC METABOLIC PANEL
CO2: 31 mEq/L (ref 19–32)
Calcium: 8.8 mg/dL (ref 8.4–10.5)
Creatinine, Ser: 0.59 mg/dL (ref 0.50–1.10)
Glucose, Bld: 99 mg/dL (ref 70–99)

## 2011-07-08 LAB — CBC
Hemoglobin: 10.9 g/dL — ABNORMAL LOW (ref 12.0–15.0)
MCH: 29 pg (ref 26.0–34.0)
MCV: 87.5 fL (ref 78.0–100.0)
RBC: 3.76 MIL/uL — ABNORMAL LOW (ref 3.87–5.11)

## 2011-07-09 LAB — GLUCOSE, CAPILLARY: Glucose-Capillary: 110 mg/dL — ABNORMAL HIGH (ref 70–99)

## 2011-07-09 NOTE — Cardiovascular Report (Signed)
  Courtney Steele, Courtney Steele                 ACCOUNT NO.:  [id number]  MEDICAL RECORD NO.:  [id number]  LOCATION:  6531                         FACILITY:  [facility]  PHYSICIAN:  Verne Carrow, MDDATE OF BIRTH:  1951/07/24  DATE OF PROCEDURE:  07/06/2011 DATE OF DISCHARGE:                           CARDIAC CATHETERIZATION   PRIMARY CARDIOLOGIST:  Luis Abed, MD, [facility]  PROCEDURE PERFORMED: 1. Percutaneous transluminal coronary angioplasty with placement of a     drug-eluting stent in the proximal left anterior descending     coronary artery. 2. Placement of an Angio-Seal femoral artery closure device in the     right femoral artery.  OPERATOR:  Verne Carrow, MD  INDICATIONS:  This is a 60 year old Caucasian female who recently has symptoms consistent with unstable angina.  She ruled out for myocardial infarction with serial cardiac enzymes.  Diagnostic catheterization was performed by Dr. Charlton Haws earlier today and she was found to have a severe stenosis in the proximal left anterior descending coronary artery.  I was asked to assist with intervention.  PROCEDURE IN DETAIL:  When I entered the case, the patient had a 6- [place] sheath present in the right femoral artery.  A bolus of Angiomax had been given and a drip was started.  An XBLAD 3.5 guiding catheter was used to selectively engage the left main artery.  When ACT was greater than 200, I passed a cougar intracoronary wire down the length of left anterior descending artery into the distal vessel.  A 2.5 x 12- mm balloon was inflated in the area of tightest stenosis.  A 3.5 x 12-mm Resolute Integrity drug-eluting stent was carefully positioned in the proximal vessel and deployed without difficulty.  A 3.75 x 90-mm noncompliant balloon was carefully positioned inside the stent and was deployed to 15 atmospheres.  The stenosis taken from 90% to 0%.  There was excellent flow into the distal vessel.  There were no  immediate complications.  The patient was taken to the recovery area in stable condition.  IMPRESSION:  Successful percutaneous coronary intervention with placement of a drug-eluting stent in the proximal left anterior descending coronary artery.  RECOMMENDATIONS:  The patient will be continued on aspirin, Plavix, beta- blocker, and statin.  She will be discharged home tomorrow if she is stable.     Verne Carrow, MD     CM/MEDQ  D:  07/06/2011  T:  07/07/2011  Job:  [id number]  cc:   Luis Abed, MD, [facility]  Electronically Signed by Verne Carrow MD on 07/09/2011 02:40:52 PM

## 2011-07-12 ENCOUNTER — Inpatient Hospital Stay (HOSPITAL_COMMUNITY)
Admission: EM | Admit: 2011-07-12 | Discharge: 2011-07-14 | DRG: 287 | Disposition: A | Discharge status: Home / Self Care | Payer: 59 | Attending: Internal Medicine | Admitting: Internal Medicine

## 2011-07-12 ENCOUNTER — Emergency Department (HOSPITAL_COMMUNITY): Payer: 59

## 2011-07-12 DIAGNOSIS — I200 Unstable angina: Secondary | ICD-10-CM | POA: Diagnosis present

## 2011-07-12 DIAGNOSIS — M1990 Unspecified osteoarthritis, unspecified site: Secondary | ICD-10-CM | POA: Diagnosis present

## 2011-07-12 DIAGNOSIS — I2510 Atherosclerotic heart disease of native coronary artery without angina pectoris: Principal | ICD-10-CM | POA: Diagnosis present

## 2011-07-12 DIAGNOSIS — Z881 Allergy status to other antibiotic agents status: Secondary | ICD-10-CM

## 2011-07-12 DIAGNOSIS — Z888 Allergy status to other drugs, medicaments and biological substances status: Secondary | ICD-10-CM

## 2011-07-12 DIAGNOSIS — Z8249 Family history of ischemic heart disease and other diseases of the circulatory system: Secondary | ICD-10-CM

## 2011-07-12 DIAGNOSIS — Z9861 Coronary angioplasty status: Secondary | ICD-10-CM

## 2011-07-12 DIAGNOSIS — Z79899 Other long term (current) drug therapy: Secondary | ICD-10-CM

## 2011-07-12 DIAGNOSIS — E785 Hyperlipidemia, unspecified: Secondary | ICD-10-CM | POA: Diagnosis present

## 2011-07-12 DIAGNOSIS — Z7902 Long term (current) use of antithrombotics/antiplatelets: Secondary | ICD-10-CM

## 2011-07-12 DIAGNOSIS — I10 Essential (primary) hypertension: Secondary | ICD-10-CM | POA: Diagnosis present

## 2011-07-12 DIAGNOSIS — Z880 Allergy status to penicillin: Secondary | ICD-10-CM

## 2011-07-12 DIAGNOSIS — IMO0001 Reserved for inherently not codable concepts without codable children: Secondary | ICD-10-CM | POA: Diagnosis present

## 2011-07-12 DIAGNOSIS — IMO0002 Reserved for concepts with insufficient information to code with codable children: Secondary | ICD-10-CM | POA: Diagnosis present

## 2011-07-12 DIAGNOSIS — R7309 Other abnormal glucose: Secondary | ICD-10-CM | POA: Diagnosis present

## 2011-07-12 DIAGNOSIS — R0789 Other chest pain: Secondary | ICD-10-CM

## 2011-07-12 DIAGNOSIS — Z7982 Long term (current) use of aspirin: Secondary | ICD-10-CM

## 2011-07-12 DIAGNOSIS — Y840 Cardiac catheterization as the cause of abnormal reaction of the patient, or of later complication, without mention of misadventure at the time of the procedure: Secondary | ICD-10-CM | POA: Diagnosis present

## 2011-07-12 DIAGNOSIS — Y9210 Unspecified residential institution as the place of occurrence of the external cause: Secondary | ICD-10-CM | POA: Diagnosis present

## 2011-07-12 LAB — DIFFERENTIAL
Eosinophils Relative: 5 % (ref 0–5)
Lymphocytes Relative: 31 % (ref 12–46)
Lymphs Abs: 1.9 10*3/uL (ref 0.7–4.0)
Monocytes Relative: 7 % (ref 3–12)

## 2011-07-12 LAB — CK TOTAL AND CKMB (NOT AT ARMC)
Relative Index: INVALID (ref 0.0–2.5)
Total CK: 48 U/L (ref 7–177)

## 2011-07-12 LAB — APTT: aPTT: 29 seconds (ref 24–37)

## 2011-07-12 LAB — CBC
HCT: 36.2 % (ref 36.0–46.0)
MCH: 28.9 pg (ref 26.0–34.0)
MCV: 86.6 fL (ref 78.0–100.0)
RDW: 14.9 % (ref 11.5–15.5)
WBC: 6.2 10*3/uL (ref 4.0–10.5)

## 2011-07-12 LAB — BASIC METABOLIC PANEL
BUN: 20 mg/dL (ref 6–23)
CO2: 28 mEq/L (ref 19–32)
Chloride: 102 mEq/L (ref 96–112)
Creatinine, Ser: 0.52 mg/dL (ref 0.50–1.10)

## 2011-07-12 LAB — TROPONIN I: Troponin I: <0.3 ng/mL (ref <0.30)

## 2011-07-13 DIAGNOSIS — I2510 Atherosclerotic heart disease of native coronary artery without angina pectoris: Secondary | ICD-10-CM

## 2011-07-13 LAB — CBC
Hemoglobin: 11.3 g/dL — ABNORMAL LOW (ref 12.0–15.0)
RBC: 3.94 MIL/uL (ref 3.87–5.11)
WBC: 6.2 10*3/uL (ref 4.0–10.5)

## 2011-07-13 LAB — CARDIAC PANEL(CRET KIN+CKTOT+MB+TROPI)
CK, MB: 1.9 ng/mL (ref 0.3–4.0)
Relative Index: INVALID (ref 0.0–2.5)
Troponin I: <0.3 ng/mL (ref <0.30)

## 2011-07-13 LAB — BASIC METABOLIC PANEL
CO2: 28 mEq/L (ref 19–32)
GFR calc non Af Amer: >60 mL/min (ref >60)
Glucose, Bld: 108 mg/dL — ABNORMAL HIGH (ref 70–99)
Potassium: 3.7 mEq/L (ref 3.5–5.1)
Sodium: 140 mEq/L (ref 135–145)

## 2011-07-13 LAB — GLUCOSE, CAPILLARY: Glucose-Capillary: 95 mg/dL (ref 70–99)

## 2011-07-13 LAB — PLATELET INHIBITION P2Y12: Platelet Function  P2Y12: 274 [PRU] (ref 194–418)

## 2011-07-13 LAB — HEPARIN LEVEL (UNFRACTIONATED): Heparin Unfractionated: 0.54 IU/mL (ref 0.30–0.70)

## 2011-07-14 LAB — CBC
MCH: 29 pg (ref 26.0–34.0)
MCHC: 33.1 g/dL (ref 30.0–36.0)
MCV: 87.5 fL (ref 78.0–100.0)
Platelets: 156 10*3/uL (ref 150–400)

## 2011-07-14 LAB — GLUCOSE, CAPILLARY: Glucose-Capillary: 95 mg/dL (ref 70–99)

## 2011-07-16 ENCOUNTER — Encounter: Payer: Self-pay | Admitting: Cardiology

## 2011-07-16 DIAGNOSIS — M797 Fibromyalgia: Secondary | ICD-10-CM | POA: Insufficient documentation

## 2011-07-16 DIAGNOSIS — I10 Essential (primary) hypertension: Secondary | ICD-10-CM | POA: Insufficient documentation

## 2011-07-16 DIAGNOSIS — S301XXA Contusion of abdominal wall, initial encounter: Secondary | ICD-10-CM | POA: Insufficient documentation

## 2011-07-16 DIAGNOSIS — Z9861 Coronary angioplasty status: Secondary | ICD-10-CM

## 2011-07-16 DIAGNOSIS — I2510 Atherosclerotic heart disease of native coronary artery without angina pectoris: Secondary | ICD-10-CM | POA: Insufficient documentation

## 2011-07-16 DIAGNOSIS — T148XXA Other injury of unspecified body region, initial encounter: Secondary | ICD-10-CM | POA: Insufficient documentation

## 2011-07-16 DIAGNOSIS — E785 Hyperlipidemia, unspecified: Secondary | ICD-10-CM | POA: Insufficient documentation

## 2011-07-17 ENCOUNTER — Ambulatory Visit (INDEPENDENT_AMBULATORY_CARE_PROVIDER_SITE_OTHER): Payer: 59 | Admitting: Cardiology

## 2011-07-17 ENCOUNTER — Encounter: Payer: Self-pay | Admitting: Cardiology

## 2011-07-17 DIAGNOSIS — I10 Essential (primary) hypertension: Secondary | ICD-10-CM

## 2011-07-17 DIAGNOSIS — I2510 Atherosclerotic heart disease of native coronary artery without angina pectoris: Secondary | ICD-10-CM

## 2011-07-17 DIAGNOSIS — S301XXA Contusion of abdominal wall, initial encounter: Secondary | ICD-10-CM

## 2011-07-17 DIAGNOSIS — T148XXA Other injury of unspecified body region, initial encounter: Secondary | ICD-10-CM

## 2011-07-17 NOTE — Assessment & Plan Note (Signed)
The patient's groin hematomas resolve.  No further workup.

## 2011-07-17 NOTE — Assessment & Plan Note (Signed)
A right radial artery site is stable now.  There is ecchymoses but no hematoma.  No further workup.  I will see her back for followup in 3 months.

## 2011-07-17 NOTE — Progress Notes (Signed)
HPI Patient is seen today post hospitalization for coronary disease.  I had seen the patient in the past and I reviewed her records.  She was admitted recently in July, 2012 with chest tightness radiation to the neck and the arms.  I have reviewed the H&P and discharge summary and the catheterization from that admission.  She received a drug-eluting stent at that time.  She had difficulties with hematoma in the right groin that resolved with pressure.  She then went home and had some chest discomfort later and was readmitted on July 12, 2011.  Her symptoms were not as significant as the original admission.  Repeat catheterization was done.  The LAD stent was patent.  There was mild compromise of a diagonal and moderate LAD stenosis.  This was not felt to be high grade.  It was felt that medical therapy was most appropriate.  This study was done to her right radial artery.  She had some difficulty with the hematoma in this arm also and it was corrected with pressure.  She is here for early followup to be sure that she's stable.  I reviewed the H&P discharge summary in catheterization report also from the second admission.  I have operated this EMR completely.  Since being at home she has had some very slight discomfort.  She has returned to water aerobics. Allergies  Allergen Reactions  . Erythromycin   . Indomethacin   . Penicillins     Current Outpatient Prescriptions  Medication Sig Dispense Refill  . albuterol (PROVENTIL,VENTOLIN) 90 MCG/ACT inhaler Inhale 2 puffs into the lungs every 6 (six) hours as needed.        Marland Kitchen amLODipine (NORVASC) 2.5 MG tablet Take 2.5 mg by mouth daily.        Marland Kitchen aspirin 81 MG tablet Take 81 mg by mouth daily.        . benazepril (LOTENSIN) 5 MG tablet Take 5 mg by mouth daily.        . calcium carbonate 200 MG capsule Take 250 mg by mouth daily.        . clopidogrel (PLAVIX) 75 MG tablet Take 75 mg by mouth daily.        . cyclobenzaprine (FLEXERIL) 10 MG tablet Take  10 mg by mouth 3 (three) times daily as needed.        . DULoxetine (CYMBALTA) 30 MG capsule 3 tabs po qd      . Ergocalciferol (VITAMIN D2) 2000 UNITS TABS Take 1 tablet by mouth daily.        . metoprolol (TOPROL-XL) 50 MG 24 hr tablet Take 50 mg by mouth daily.        . Multiple Vitamin (MULTIVITAMIN) tablet Take 1 tablet by mouth daily.        . nitroGLYCERIN (NITROSTAT) 0.4 MG SL tablet Place 0.4 mg under the tongue every 5 (five) minutes as needed.        . pregabalin (LYRICA) 75 MG capsule 3 tabs morning and 3 tabs qhs      . simvastatin (ZOCOR) 40 MG tablet Take 40 mg by mouth at bedtime.        . traMADol (ULTRAM) 50 MG tablet Take 50 mg by mouth 3 (three) times daily.        . vitamin C (ASCORBIC ACID) 500 MG tablet Take 500 mg by mouth daily.          History   Social History  . Marital Status: Married  Spouse Name: N/A    Number of Children: 2  . Years of Education: N/A   Occupational History  . [employer] [place] [employer]   Social History Main Topics  . Smoking status: Never Smoker   . Smokeless tobacco: Not on file  . Alcohol Use: Yes     occasional  . Drug Use: No  . Sexually Active: Not on file   Other Topics Concern  . Not on file   Social History Narrative  . No narrative on file    Family History  Problem Relation Age of Onset  . Other Mother     died pulmonary issues  . Coronary artery disease Father     died- CABG-HTN- AAA    Past Medical History  Diagnosis Date  . CAD (coronary artery disease)     DES to the LAD July 06, 2011  / Relook catheter July 14, 2011 stent patent, moderate mid LAD stenosis but not high grade , plan medical therapy  . Hypertension   . Hyperlipidemia   . Borderline diabetes   . Morbid obesity   . Fibromyalgia   . Osteoarthritis   . Degenerative joint disease   . H/O: hysterectomy   . Groin hematoma     July 06 2011 treated with pressure  . Hematoma     Radial catheter site July 13, 2011    Past Surgical  History  Procedure Date  . Cardiac catheterization     revealing patency of the LAD stent with mild compromise of  the diagonal and moderate mid LAD stenosis, but does not appear to  be high grade.   . Cholecystectomy   . Tonsillectomy   . Carpal tunnel release     ROS  Patient denies fever, chills, headache, sweats, rash, change in vision, change in hearing, chest pain, cough, nausea vomiting, urinary symptoms.  All other systems are reviewed and are negative  PHYSICAL EXAM Patient is overweight but stable.  Head is atraumatic.  There is no jugular venous distention.  Lungs are clear.  Respiratory effort is nonlabored.  Cardiac exam reveals an S1-S2.  No clicks or significant murmurs.  The abdomen is soft.  She has ecchymosis in the area of the right radial artery.  The pulse is good and the area is soft.  There is no significant peripheral edema.  There are no musculoskeletal deformities.  There are no skin rashes. Filed Vitals:   07/17/11 1049  BP: 100/70  Pulse: 78  Resp: 12  Height: 5\' 4"  (1.626 m)  Weight: 217 lb (98.431 kg)    EKG Is not done today.  ASSESSMENT & PLAN

## 2011-07-17 NOTE — Assessment & Plan Note (Signed)
Blood pressure is controlled. No change in therapy. 

## 2011-07-17 NOTE — Patient Instructions (Signed)
Your physician recommends that you schedule a follow-up appointment in: 3 months.  

## 2011-07-17 NOTE — Assessment & Plan Note (Signed)
As outlined in the history of present illness the patient had a complex course of the last month and a half.  She is now stable.  She is on appropriate medications.  I will see her back in 3 months.

## 2011-07-18 ENCOUNTER — Telehealth: Payer: Self-pay | Admitting: Cardiology

## 2011-07-18 ENCOUNTER — Encounter: Payer: Self-pay | Admitting: *Deleted

## 2011-07-18 NOTE — Telephone Encounter (Signed)
Back to Work Note faxed to Wyman Songster  @ [phone]  07/18/11/KM

## 2011-07-19 NOTE — Discharge Summary (Signed)
Courtney Steele, Courtney Steele                 ACCOUNT NO.:  [id number]  MEDICAL RECORD NO.:  [id number]  LOCATION:  2927                         FACILITY:  [facility]  PHYSICIAN:  Gerrit Friends. Dietrich Pates, MD, FACCDATE OF BIRTH:  03-05-1951  DATE OF ADMISSION:  07/12/2011 DATE OF DISCHARGE:  07/14/2011                              DISCHARGE SUMMARY   PRIMARY CARDIOLOGIST:  Luis Abed, MD, [facility].  PRIMARY CARE PHYSICIAN:  Gretta Arab. Valentina Lucks, MD  PROCEDURES PERFORMED DURING HOSPITALIZATION: 1. Cardiac catheterization dated July 13, 2011, with patency of the     left anterior descending stent with mild compromise of the diagonal     and moderate mid LAD stenosis, but does not appear to be high     grade, continue to treat medically at this time.  FINAL DISCHARGE DIAGNOSES: 1. Unstable angina.     a.     Status post cardiac catheterization completed on July 13, 2011, revealing patency of the LAD stent with mild compromise of   the diagonal and moderate mid LAD stenosis, but does not appear to      be high grade.     b.     At present time, probably would give her a little more time      and to treat medically, if she continues to have symptoms with      exertion then fractional flow reserve would be the next option.     c.     July 06, 2011, cardiac catheterization with 90% proximal LAD      stenosis resulting in a 3.5 x 12 mm Resolute Integrity drug-      eluting stent. 2. Percutaneous coronary intervention of right groin hematoma. 3. Right radial hematoma post catheterization July 13, 2011. 4. Hypertension. 5. Hyperlipidemia. 6. Borderline diabetes. 7. Morbid obesity. 8. Fibromyalgia. 9. Osteoarthritis. 10.Degenerative joint disease.  HOSPITAL COURSE:  This is a very friendly 60 year old female patient of Dr. Myrtis Ser with known history of CAD with recent admission in July 2012 for unstable angina, at that time, was found to have LAD with stenosis and was treated with a drug-eluting  stent as stated above.  The patient returned with recurrent angina and cardiac catheterization was completed for reevaluation of stent.  It was found that the patient did have patency of the left anterior descending stent with some mild compromise of the diagonal and moderate mid LAD stenosis, but did not appear to be high grade.  The patient's cath films were thoroughly evaluated by Dr. Bonnee Quin, he stated that the treatment would require a fairly long stent, therefore, at present time would give her more time and treat her medically before another intervention.  If she continued to have symptoms with exertion then a fractional flow reserve would be the next option.  He discussed with Dr. Myrtis Ser by phone.  The patient had subsequent to the cardiac catheterization significant hematoma of her right radial vein.  She had a long-term compression of her right wrist overnight with slow deflation of the pressure.  The morning of discharge, the patient was seen and examined by Dr. Dietrich Pates on round and the hematoma  had dissipated, however, she still had significant bruising and some mild swelling only.  Pulses were palpable. She was able to move her hands and her wrists without difficulty.  It was found that the patient was stable to return home and she will follow up with Dr. Myrtis Ser on a previously scheduled appointment.  The patient's medications were adjusted by Dr. Dietrich Pates on discharge to include increasing metoprolol from 25 mg daily to 50 mg daily and also to add Norvasc 2.5 mg daily to her medication regimen.  New prescriptions were provided to give to the patient on discharge.  DISCHARGE LABS:  Hemoglobin 11.4, hematocrit 34.4, white blood cells 4.6, platelets are 156.  D-dimer 0.28.  Sodium 140, potassium 3.7, chloride 105, CO2 28, glucose 108, BUN 14, creatinine 0.57, platelet function P2Y12 was 274.  Cardiac enzymes were found to be negative x3 of less than 0.30.  RADIOLOGY:   Chest x-ray dated July 12, 2011 revealing a normal chest.  FOLLOWUP PLANS AND APPOINTMENTS: 1. The patient has been given postcardiac catheterization instructions     with particular emphasis on the right wrist for evidence of     bleeding, increased hematoma, signs of infection, severe pain, she     is to call us immediately. 2. She has been given prescription for metoprolol at higher dose of 50     mg daily and Norvasc at 12.5 mg daily. 3. The patient is to follow up with her primary care physician Dr.     Valentina Lucks for continued medical management. 4. The patient has been advised to bring all medications to follow up     appointment.  Time spent with the patient to include physician time was 35 minutes.     Bettey Mare. Lyman Bishop, NP   ______________________________ Gerrit Friends. Dietrich Pates, MD, [facility]    KML/MEDQ  D:  07/14/2011  T:  07/14/2011  Job:  [id number]  cc:   Gretta Arab. Valentina Lucks, M.D.  Electronically Signed by Joni Reining NP on 07/16/2011 08:44:47 AM Electronically Signed by [place] Bing MD [facility] on 07/19/2011 06:54:59 PM

## 2011-07-23 ENCOUNTER — Telehealth: Payer: Self-pay | Admitting: Cardiology

## 2011-07-23 NOTE — Telephone Encounter (Signed)
PT Dropped Off FMLA @ [place], Faxed to [employer], Unable to get ROI packet signed  07/22/12/km

## 2011-07-25 NOTE — H&P (Signed)
Courtney Steele, Courtney Steele                 ACCOUNT NO.:  [id number]  MEDICAL RECORD NO.:  [id number]  LOCATION:  2039                         FACILITY:  [facility]  PHYSICIAN:  Jesse Sans. Mayling Aber, MD, FACCDATE OF BIRTH:  [birth year]  DATE OF ADMISSION:  07/05/2011 DATE OF DISCHARGE:                             HISTORY & PHYSICAL   PRIMARY CARDIOLOGIST:  Luis Abed, MD, [facility]  The patient is a 60 year old female with history of atypical chest pain and fibromyalgia status post nonobstructive cath in 2002 and negative Myoview in 2010 who presents with recurrent exertional angina.  PROBLEMS: 1. Unstable angina.     a.     July 18, 2001, cardiac catheterization, left main normal,      LAD 20% proximal and D1 40% proximal, left circumflex normal, RCA      normal, EF 65%.     bEugenie Steele Myoview October 11, 2009 showing no evidence of      ischemia or infarct with an EF of 61%. 2. Hypertension. 3. Hyperlipidemia. 4. History of borderline diabetes. 5. Cholelithiasis status post cholecystectomy in 2009. 6. Fibromyalgia. 7. Status post hysterectomy. 8. Status post tonsillectomy. 9. Status post carpal tunnel surgery. 10.Osteoarthritis. 11.Status post left meniscus tear and repair. 12.History of right elbow pain status post ulnar nerve release. 13.DJD. 14.Obesity.  ALLERGIES:  PENICILLIN, ERYTHROMYCIN, INDOMETHACIN, BIAXIN.  HISTORY OF PRESENT ILLNESS:  A 60 year old female with the above problem list.  The patient is status post catheterization in 2002 showing nonobstructive disease and over the years has had occasional "twinges" of chest discomfort which are generally fleeting in nature and occurring about once a month or less.  She had worsened chest pain in November 2010 prompting ER visit and subsequent discharge with outpatient Myoview which was negative.  Since that outpatient Myoview in November 2010, the patient continued to have occasional twinges of chest discomfort occurring,  maybe 1-2 times a month or less.  The symptoms last just a few minutes and resolves spontaneously.  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  While out there with the bees, she noted moderate to severe substernal chest pressure with dyspnea. Symptoms persisted for about 15 minutes before she could sit down and eventually symptoms resolved with rest.  She has had no recurrent symptoms over the past 2 weeks until this morning and she went to a water aerobics class (this was the first real activity she has done since her initial symptoms 2 weeks ago) and about 30 minutes into her water aerobics class, she noted recurrent substernal chest discomfort with mild dyspnea.  She reduced her phase of exercise and symptoms resolved in about 5 minutes.  Later, when getting out of the pool which she said was quite fatiguing, she had recurrent substernal chest discomfort and dyspnea.  This persisted while she was showering after her time in the pool and then eventually resolved with rest.  Later this morning while running some errands, she had recurrent exertional angina and finally this prompted her to present to the ED.  Here, her ECG is nonacute while point-of-care markers  have been negative.  She did have recurrent chest pain here which was nitrate responsive.  We have been asked to evaluate.  HOME MEDICATIONS: 1. Albuterol inhaler 1-2 puffs q.4 h. P.r.n. 2. Calcium carbonate plus D 1 tablet daily. 3. Vitamin D2, 1 tablet daily. 4. Vitamin C daily. 5. Lyrica 75 mg 3 capsules b.i.d. 6. Tylenol 50 mg t.i.d. p.r.n. 7. Multivitamin 1 tab daily. 8. Meloxicam 7.5 mg daily. 9. Simvastatin 40 mg daily. 10.Cymbalta 30 mg 3 caps daily. 11.Cyclobenzaprine 10 mg t.i.d. p.r.n. 12.Benazepril 5 mg daily. 13.Aspirin 325 mg daily.  FAMILY HISTORY:  Mother died of pulmonary issues.  Father died with history of coronary disease, CABG, hypertension,  and abdominal aortic aneurysm.  She is an only child.  SOCIAL HISTORY:  The patient lives in [place] with her husband.  She works as an Charity fundraiser at [employer].  She has 2 grown children.  She denies tobacco or drug use.  She has an occasional alcoholic beverage and does try to do water aerobics.  This has not been regular.  REVIEW OF SYSTEMS:  Positive for chest pain and dyspnea on exertion as outlined in the HPI.  Otherwise all systems reviewed and negative.  She is a full code.  PHYSICAL EXAMINATION:  VITAL SIGNS:  Temperature 98.6, heart rate 78, respirations 16, blood pressure 102/66, pulse ox 99% on room air. GENERAL:  Pleasant white female in no acute distress.  Awake, alert, and oriented x3.  She has normal affect. HEENT:  Normal.  Nares grossly intact and nonfocal. SKIN:  Warm and dry without lesions or masses. NECK:  Supple and obese, difficult to assess JVP.  No bruits. LUNGS:  Respirations are unlabored with bibasilar crackles. CARDIAC:  Regular S1, S2.  No S3, S4, or murmurs. ABDOMEN:  Obese, soft, nontender, nondistended.  Bowel sounds present x4. EXTREMITIES:  Warm, dry, pink.  No clubbing, cyanosis, or edema. Dorsalis pedis, posterior tibial pulses 2+ and equal bilaterally.  Chest x-ray showed no acute disease.  EKG shows sinus rhythm, rate of 94, normal axis, no acute ST-T changes.  Hemoglobin 13.3, hematocrit 39.7, WBC 6.5, platelets 184,000]  Sodium 143, potassium 3.5, chloride 105, CO2 of 28, BUN 21, creatinine 0.55, glucose 94, CK 81, MB 2.7, troponin-I less than 0.30.  ASSESSMENT/PLAN: 1. Unstable angina.  The patient presents with symptoms concerning for     exertional angina.  Plan to admit and cycle enzymes.  Add heparin,     Nitropaste, beta-blocker, and plan cath in the a.m.  The patient     agreeable. 2. Hypertension, stable. 3. Hyperlipidemia.  Check lipids and LFTs.  Continue statin. 4. Obesity, would benefit nutrition  evaluation.     Courtney Steele, ANP   ______________________________ Jesse Sans. Daleen Squibb, MD, [facility]    CB/MEDQ  D:  07/05/2011  T:  07/06/2011  Job:  [id number]  Electronically Signed by Courtney Steele ANP on 07/10/2011 07:27:32 PM Electronically Signed by Valera Castle MD [facility] on 07/25/2011 10:37:54 AM

## 2011-07-26 NOTE — Cardiovascular Report (Signed)
Courtney Steele, Courtney Steele                 ACCOUNT NO.:  [id number]  MEDICAL RECORD NO.:  [id number]  LOCATION:  2927                         FACILITY:  [facility]  PHYSICIAN:  Arturo Morton. 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  She underwent recent presentation and placement of a 3.5 x 12 Resolute Integrity stent in the proximal left anterior descending artery.  She had segmental disease involving the mid left anterior descending artery of about 50%. This was left without intervention.  She was presented with some recurrent exertional chest discomfort similar to her prior angina.  Her enzymes are not acute.  The current study was done to assess coronary anatomy.  PROCEDURES: 1. Placement of catheters without left heart catheterization. 2. Selective coronary arteriography.  DESCRIPTION OF PROCEDURE:  The procedure was performed from the right radial artery without complication.  A 5-French sheath was placed with 3 mg of intra-arterial verapamil and weight adjusted heparin.  We used a JL-3.0 guide and a standard right catheter to engage the coronaries. Importantly, she has a fairly long distance with the brachiocephalic leading high into the aorta, so she is not a good candidate really for radial catheterization, particularly of the right coronary.  There were no major complications.  TR band was placed at the completion of the procedure.  I reviewed the films with the patient and her husband.  HEMODYNAMIC DATA:  The central aortic pressure was 92/56, mean 74.  ANGIOGRAPHIC DATA: 1. The left main coronary artery is free of critical disease. 2. The left anterior descending artery has been previously stented.     The stent overlies the origin of the diagonal.  The stent is  widely     patent with less than 10% narrowing.  The diagonal itself has some     narrowing, probably approaching 50% of the ostium, but looks     actually somewhat better than during the interventional films.     There is a clear-cut mid-LAD stenosis that represents about 50-60%     luminal reduction.  It does not appear to be high-grade and is     rather smooth and segmental.  Just after this, vessel opens back up     and provides the mid and distal portion the left anterior     descending artery down to the apex. 3. The circumflex is a large-caliber vessel providing a marginal     branch which trifurcates distally and is free of critical disease. 4. The right coronary artery is a fairly large-caliber vessel that     provides posterior descending and posterolateral branch and is free     of critical disease.  CONCLUSION:  Continued patency of the left anterior descending stent with mild compromise of the diagonal and a moderate mid LAD stenosis but does not appear to be high-grade.  DISPOSITION:  At the present time, the stent appears to be widely patent and there does not appear to be significant progression of disease in  the mid left anterior descending artery.  The lesion itself is calcified.  Treatment would require fairly long stent.  At the present time, probably would give her a little bit more time and treat her medically.  If she continues to have symptoms with exertion, then fractional flow reserve would be the next option.  I will inform Dr. Myrtis Ser of her findings.     Arturo Morton. Riley Kill, MD, [facility]     TDS/MEDQ  D:  07/13/2011  T:  07/14/2011  Job:  [id number]  cc:   Luis Abed, MD, [facility] Verne Carrow, MD  Electronically Signed by Shawnie Pons MD [facility] on 07/26/2011 09:51:52 AM

## 2011-07-31 NOTE — Cardiovascular Report (Signed)
  NAMECARYNN, Courtney Steele                 ACCOUNT NO.:  [id number]  MEDICAL RECORD NO.:  [id number]  LOCATION:  6531                         FACILITY:  [facility]  PHYSICIAN:  Noralyn Pick. Eden Emms, MD, FACCDATE OF BIRTH:  [birth year]  DATE OF PROCEDURE:  07/06/2011 DATE OF DISCHARGE:                           CARDIAC CATHETERIZATION   PROCEDURE:  Coronary arteriography.  OPERATOR:  Noralyn Pick. Eden Emms, MD, [facility]  INDICATIONS:  New-onset angina.  TECHNIQUE:  Cine catheterization was done with JR-4 and JL-4 catheters from the right femoral artery.  Initially, a 5-French sheath was used and upgraded to a 6-French sheath for angioplasty.  Left main coronary artery was normal.  The proximal LAD had a discrete 90% focal stenosis.  Mid and distal LAD were normal.  First diagonal branch was normal.  Circumflex coronary artery was nondominant and normal.  There was a large obtuse marginal branch which was normal.  The right coronary artery was dominant and normal.RAO ventriculography:  RAO ventriculography was normal.  The EF was 60%. There was no regional wall motion abnormality.  Aortic pressure was 118/76, LV pressure was 118/18.  IMPRESSION:  The films were reviewed with Dr. Clifton James.  He will proceed with stenting of a large discrete focal left anterior descending lesion.  The patient will be loaded with 600 of Plavix and Angiomax.  She tolerated the diagnostic procedure well.     Noralyn Pick. Eden Emms, MD, [facility]    PCN/MEDQ  D:  07/06/2011  T:  07/06/2011  Job:  [id number]  Electronically Signed by Charlton Haws MD [facility] on 07/31/2011 09:21:33 AM

## 2011-08-02 NOTE — Discharge Summary (Signed)
Courtney Steele, Courtney Steele                 ACCOUNT NO.:  [id number]  MEDICAL RECORD NO.:  [id number]  LOCATION:  2001                         FACILITY:  [facility]  PHYSICIAN:  Hillis Range, MD       DATE OF BIRTH:  04-08-1951  DATE OF ADMISSION:  07/05/2011 DATE OF DISCHARGE:  07/08/2011                              DISCHARGE SUMMARY   PROCEDURES: 1. Cardiac catheterization. 2. Coronary arteriogram. 3. Left ventriculogram. 4. Two-view chest x-ray. 5. PTCA and 3.5 x 12-mm drug-eluting stent to the proximal LAD.  PRIMARY FINAL DISCHARGE DIAGNOSIS:  Unstable anginal pain.  SECONDARY DIAGNOSES: 1. Hyperlipidemia with total cholesterol 146, triglycerides 184, HDL     41, LDL 68 on therapy. 2. Diabetes with a hemoglobin A1c of 6.3 this admission. 3. Acute blood loss anemia with a hemoglobin on admission 13.3,     hematocrit 39.7, 10.9 and 32.9 at discharge. 4. Right groin bleed post catheterization (Angio-Seal used at     catheterization). 5. Borderline obesity with a body mass index of 30. 6. Hypertension. 7. Fibromyalgia. 8. Cholelithiasis status post cholecystectomy as well as hysterectomy,     tonsillectomy, and carpal tunnel surgery plus left knee surgery and     right elbow surgery. 9. Degenerative joint disease and osteoarthritis. 10.Allergy or intolerance to PENICILLIN, ERYTHROMYCIN, INDOMETHACIN,     and BIAXIN. 11.Family history of coronary artery disease in her father. 12.History of depression.  TIME AT DISCHARGE:  Thirty six minutes.  HOSPITAL COURSE:  Courtney Steele is a 60 year old female with a history of chest pain and nonobstructive coronary artery disease by cath in 2002. She had chest pain and came to the hospital where she was admitted for further evaluation.  Her cardiac enzymes were negative for MI.  She was taken to the cath lab on July 06, 2011, and was found to have single-vessel disease with a proximal LAD 90% and no other disease.  Her EF was normal.   Percutaneous intervention was performed by Dr. Clifton Elliott Quade with the stent described above, reducing the stenosis from 90% to zero.  After the first time she got up to the bathroom, she felt sharp pain in her right groin and had a small hematoma.  This was treated with direct pressure and a pressure dressing.  She was put back on bed rest.  The following morning, her groin was stable, but then she had sudden onset of right groin pain and she had a hematoma again.  This was treated with direct pressure.  Distal pulses were intact.  An ultrasound was ordered, but there was no pseudoaneurysm or AV fistula.  She was put back on bed rest and held overnight.  On July 08, 2011, her hemoglobin had only dropped from 11.4 to 10.9. She was having no further problems with groin bleeding and ambulated without chest pain or shortness of breath.  Her groin was without hematoma or bruit.  She is considered stable for discharge, to follow up as an outpatient.  DISCHARGE INSTRUCTIONS:  Her activity level is to be increased gradually.  She is not to do any lifting for 2 weeks and no driving for 2 days.  She is encouraged to  stick to a low-sodium, diabetic diet.  She is to call our office for problems with the cath site.  She is to follow up with Dr. Myrtis Ser and we will call her with an appointment.  She is to follow up with Dr. Valentina Lucks as needed.  DISCHARGE MEDICATIONS: 1. Benazepril 5 mg a day. 2. Lyrica 75 mg 3 capsules b.i.d. 3. Cymbalta 30 mg 3 capsules daily. 4. Cyclobenzaprine 10 mg t.i.d. p.r.n. 5. Zocor 40 mg a day. 6. Multivitamins daily. 7. Sublingual nitroglycerin p.r.n. 8. Tramadol 50 mg t.i.d. p.r.n. 9. Aspirin 325 mg a day. 10.Meloxicam is discontinued. 11.Plavix 75 mg a day. 12.Calcium plus D daily. 13.Albuterol inhaler q.4 h. p.r.n. 14.Toprol-XL 25 mg a day.     Theodore Demark, PA-C   ______________________________ Hillis Range, MD    RB/MEDQ  D:  07/08/2011  T:  07/08/2011   Job:  [id number]  cc:   Gretta Arab. Valentina Lucks, M.D.  Electronically Signed by Theodore Demark PA-C on 07/17/2011 06:27:54 AM Electronically Signed by Hillis Range MD on 08/02/2011 09:41:39 AM

## 2011-08-06 ENCOUNTER — Encounter (HOSPITAL_COMMUNITY): Payer: 59

## 2011-08-08 ENCOUNTER — Encounter (HOSPITAL_COMMUNITY): Payer: 59

## 2011-08-10 ENCOUNTER — Encounter (HOSPITAL_COMMUNITY): Payer: 59

## 2011-08-13 ENCOUNTER — Encounter (HOSPITAL_COMMUNITY): Payer: 59

## 2011-08-15 ENCOUNTER — Other Ambulatory Visit: Payer: Self-pay | Admitting: Cardiology

## 2011-08-15 ENCOUNTER — Encounter (HOSPITAL_COMMUNITY)
Admission: RE | Admit: 2011-08-15 | Discharge: 2011-08-15 | Disposition: A | Discharge status: Home / Self Care | Payer: 59 | Source: Ambulatory Visit | Attending: Cardiology | Admitting: Cardiology

## 2011-08-15 DIAGNOSIS — Z8249 Family history of ischemic heart disease and other diseases of the circulatory system: Secondary | ICD-10-CM | POA: Insufficient documentation

## 2011-08-15 DIAGNOSIS — Z888 Allergy status to other drugs, medicaments and biological substances status: Secondary | ICD-10-CM | POA: Insufficient documentation

## 2011-08-15 DIAGNOSIS — E785 Hyperlipidemia, unspecified: Secondary | ICD-10-CM | POA: Insufficient documentation

## 2011-08-15 DIAGNOSIS — I200 Unstable angina: Secondary | ICD-10-CM | POA: Insufficient documentation

## 2011-08-15 DIAGNOSIS — Z9861 Coronary angioplasty status: Secondary | ICD-10-CM | POA: Insufficient documentation

## 2011-08-15 DIAGNOSIS — Z7902 Long term (current) use of antithrombotics/antiplatelets: Secondary | ICD-10-CM | POA: Insufficient documentation

## 2011-08-15 DIAGNOSIS — Z79899 Other long term (current) drug therapy: Secondary | ICD-10-CM | POA: Insufficient documentation

## 2011-08-15 DIAGNOSIS — I2510 Atherosclerotic heart disease of native coronary artery without angina pectoris: Secondary | ICD-10-CM | POA: Insufficient documentation

## 2011-08-15 DIAGNOSIS — Z5189 Encounter for other specified aftercare: Secondary | ICD-10-CM | POA: Insufficient documentation

## 2011-08-15 DIAGNOSIS — Z7982 Long term (current) use of aspirin: Secondary | ICD-10-CM | POA: Insufficient documentation

## 2011-08-15 DIAGNOSIS — Z881 Allergy status to other antibiotic agents status: Secondary | ICD-10-CM | POA: Insufficient documentation

## 2011-08-15 DIAGNOSIS — I10 Essential (primary) hypertension: Secondary | ICD-10-CM | POA: Insufficient documentation

## 2011-08-15 DIAGNOSIS — Z880 Allergy status to penicillin: Secondary | ICD-10-CM | POA: Insufficient documentation

## 2011-08-15 DIAGNOSIS — M1990 Unspecified osteoarthritis, unspecified site: Secondary | ICD-10-CM | POA: Insufficient documentation

## 2011-08-17 ENCOUNTER — Other Ambulatory Visit: Payer: Self-pay | Admitting: Cardiology

## 2011-08-17 ENCOUNTER — Encounter (HOSPITAL_COMMUNITY): Payer: 59

## 2011-08-17 LAB — GLUCOSE, CAPILLARY: Glucose-Capillary: 82 mg/dL (ref 70–99)

## 2011-08-20 ENCOUNTER — Encounter (HOSPITAL_COMMUNITY): Payer: 59

## 2011-08-22 ENCOUNTER — Encounter (HOSPITAL_COMMUNITY): Payer: 59

## 2011-08-24 ENCOUNTER — Encounter (HOSPITAL_COMMUNITY): Payer: 59

## 2011-08-27 ENCOUNTER — Telehealth: Payer: Self-pay | Admitting: Cardiology

## 2011-08-27 ENCOUNTER — Encounter: Payer: Self-pay | Admitting: *Deleted

## 2011-08-27 ENCOUNTER — Encounter: Payer: Self-pay | Admitting: Physician Assistant

## 2011-08-27 ENCOUNTER — Encounter (HOSPITAL_COMMUNITY): Payer: 59

## 2011-08-27 ENCOUNTER — Ambulatory Visit (INDEPENDENT_AMBULATORY_CARE_PROVIDER_SITE_OTHER): Payer: 59 | Admitting: Physician Assistant

## 2011-08-27 VITALS — BP 106/70 | HR 66 | Ht 64.0 in | Wt 216.0 lb

## 2011-08-27 DIAGNOSIS — I2510 Atherosclerotic heart disease of native coronary artery without angina pectoris: Secondary | ICD-10-CM

## 2011-08-27 DIAGNOSIS — R079 Chest pain, unspecified: Secondary | ICD-10-CM

## 2011-08-27 DIAGNOSIS — E785 Hyperlipidemia, unspecified: Secondary | ICD-10-CM

## 2011-08-27 DIAGNOSIS — I10 Essential (primary) hypertension: Secondary | ICD-10-CM

## 2011-08-27 LAB — PROTIME-INR
INR: 1 ratio (ref 0.8–1.0)
Prothrombin Time: 11.2 s (ref 10.2–12.4)

## 2011-08-27 LAB — BASIC METABOLIC PANEL
CO2: 29 mEq/L (ref 19–32)
Calcium: 9.3 mg/dL (ref 8.4–10.5)
Chloride: 106 mEq/L (ref 96–112)
Sodium: 141 mEq/L (ref 135–145)

## 2011-08-27 LAB — CBC WITH DIFFERENTIAL/PLATELET
Basophils Relative: 0.6 % (ref 0.0–3.0)
Eosinophils Absolute: 0.2 10*3/uL (ref 0.0–0.7)
Eosinophils Relative: 3.3 % (ref 0.0–5.0)
Hemoglobin: 13 g/dL (ref 12.0–15.0)
Lymphocytes Relative: 33.3 % (ref 12.0–46.0)
MCHC: 33.2 g/dL (ref 30.0–36.0)
Neutro Abs: 3.2 10*3/uL (ref 1.4–7.7)
RBC: 4.41 Mil/uL (ref 3.87–5.11)
WBC: 5.7 10*3/uL (ref 4.5–10.5)

## 2011-08-27 NOTE — Telephone Encounter (Signed)
Per cardiac rehab pt has been having more frequent chest pain and discomfort at rest and with exercise.  She has had to take SL Ntg for relief.  This occurred during the weekend X 2 and once today while in cardiac rehab.  She is pain free now.  She will added on today with Lilian Coma for evaluation.

## 2011-08-27 NOTE — Telephone Encounter (Signed)
Pt is having more frequent chest pain with exertion.  Cardiac rehab would like her to be seen as soon as possible.  Call rehab back and pt will be there until 9:30.

## 2011-08-27 NOTE — Patient Instructions (Signed)
Your physician has requested that you have a cardiac catheterization 9/118/12 @ 12:00 WITH DR. Excell Seltzer. Cardiac catheterization is used to diagnose and/or treat various heart conditions. Doctors may recommend this procedure for a number of different reasons. The most common reason is to evaluate chest pain. Chest pain can be a symptom of coronary artery disease (CAD), and cardiac catheterization can show whether plaque is narrowing or blocking your heart's arteries. This procedure is also used to evaluate the valves, as well as measure the blood flow and oxygen levels in different parts of your heart. For further information please visit [email or web address] Please follow instruction sheet, as given.   Your physician recommends that you return for lab work in: PRE CATH LABS TODAY  BMET, CBC W/DIFF, PT/INR

## 2011-08-27 NOTE — Assessment & Plan Note (Addendum)
Continue aspirin and Plavix.  Proceed with cardiac catheterization as noted with FFR.  She knows to go the emergency room should she have any change in her symptoms.

## 2011-08-27 NOTE — Assessment & Plan Note (Signed)
She continues to have anginal symptoms.  I discussed her case today with Dr. Riley Kill who did her last heart catheterization.  At this point, we recommend proceeding with repeat cardiac catheterization with fractional flow reserve measurement in her LAD and diagonal.  If she does not have hemodynamically significant lesions, medical therapy will need to be adjusted.  She has a soft blood pressure and likely would not tolerate the addition of Imdur or or increasing her amlodipine.  We could consider putting her on something like Ranexa.  I discussed all this with the patient today.  Risks and benefits of cardiac catheterization have been discussed with the patient.  These include bleeding, infection, kidney damage, stroke, heart attack, death.  The patient understands these risks and is willing to proceed.

## 2011-08-27 NOTE — Assessment & Plan Note (Signed)
She is currently on atorvastatin.  LDL was optimal at last check

## 2011-08-27 NOTE — Assessment & Plan Note (Signed)
Controlled.  

## 2011-08-27 NOTE — Progress Notes (Signed)
History of Present Illness: Primary Cardiologist:  Dr. Zackery Barefoot  Courtney Steele is a 60 y.o. female who presents for evaluation of chest pain.  She has a history of CAD.  She underwent drug-eluting stent placement to the proximal LAD 07/06/11.  She had recurrent symptoms and had repeat cardiac catheterization 07/13/11 with Dr. Riley Kill.  The LAD stent was patent.  She had some compromise of the diagonal from the LAD stent that was rated at 50%.  She also had a long mid LAD 50-60% lesion.  Medical therapy was recommended.  However, it was felt that if the patient continued to have symptoms, fractional flow reserve would be the next option.  She did have difficulty with a groin hematoma after her initial cardiac catheterization and PCI.  Her last case was done via the wrist with a mild hematoma.  She last saw Dr. Myrtis Ser 8/8.  She was doing well at that time.    The patient was at cardiac rehabilitation this morning and developed chest discomfort while riding a stationary bicycle.  This was the most exertion she had done since her last heart catheterization.  She developed substernal and left-sided chest tightness and burning.  There was radiation to her neck and jaw.  She also noted radiation to her bilateral arms.  She denies associated shortness of breath.  She does have some associated nausea.  She denies diaphoresis.  She denies syncope or near-syncope.  She denies orthopnea, PND or significant edema.  Her pain lasted about 15-20 minutes.  She was given nitroglycerin.  Both rest and nitroglycerin seemed to improve her symptoms.  She was added onto my schedule.  She is currently pain-free.  Past Medical History  Diagnosis Date  . CAD (coronary artery disease)     DES to the LAD July 06, 2011  / Relook catheter July 14, 2011 stent patent, moderate mid LAD stenosis but not high grade , plan medical therapy  . Hypertension   . Hyperlipidemia   . Borderline diabetes   . Morbid obesity   . Fibromyalgia   .  Osteoarthritis   . Degenerative joint disease   . H/O: hysterectomy   . Groin hematoma     July 06 2011 treated with pressure  . Hematoma     Radial catheter site July 13, 2011    Current Outpatient Prescriptions  Medication Sig Dispense Refill  . albuterol (PROVENTIL,VENTOLIN) 90 MCG/ACT inhaler Inhale 2 puffs into the lungs every 6 (six) hours as needed.        Marland Kitchen amLODipine (NORVASC) 2.5 MG tablet Take 2.5 mg by mouth daily.        Marland Kitchen aspirin 81 MG tablet Take 81 mg by mouth daily.        . benazepril (LOTENSIN) 5 MG tablet Take 5 mg by mouth daily.        . calcium carbonate 200 MG capsule Take 250 mg by mouth daily.        . clopidogrel (PLAVIX) 75 MG tablet Take 75 mg by mouth daily.        . cyclobenzaprine (FLEXERIL) 10 MG tablet Take 10 mg by mouth 3 (three) times daily as needed.        . DULoxetine (CYMBALTA) 30 MG capsule 3 tabs po qd      . ergocalciferol (VITAMIN D2) 50000 UNITS capsule Take 50,000 Units by mouth once a week.        . metoprolol (TOPROL-XL) 50 MG 24 hr tablet Take  50 mg by mouth daily.        . Multiple Vitamin (MULTIVITAMIN) tablet Take 1 tablet by mouth daily.        . nitroGLYCERIN (NITROSTAT) 0.4 MG SL tablet Place 0.4 mg under the tongue every 5 (five) minutes as needed.        . pregabalin (LYRICA) 75 MG capsule 3 tabs morning and 3 tabs qhs      . traMADol (ULTRAM) 50 MG tablet Take 50 mg by mouth 3 (three) times daily.        . vitamin C (ASCORBIC ACID) 500 MG tablet Take 500 mg by mouth daily.        Marland Kitchen atorvastatin (LIPITOR) 20 MG tablet Take 1 tablet (20 mg total) by mouth daily.        Allergies: Allergies  Allergen Reactions  . Erythromycin   . Indomethacin   . Penicillins     Social history:Nonsmoker  ROS:  Please see the history of present illness.  All other systems reviewed and negative.   Vital Signs: BP 106/70  Pulse 66  Ht 5\' 4"  (1.626 m)  Wt 216 lb (97.977 kg)  BMI 37.08 kg/m2  PHYSICAL EXAM: Well nourished, well  developed, in no acute distress HEENT: normal Neck: no JVD Cardiac:  normal S1, S2; RRR; no murmur Lungs:  clear to auscultation bilaterally, no wheezing, rhonchi or rales Abd: soft, nontender, no hepatomegaly Ext: Trace bilateral ankle edema Vascular: No femoral artery bruits noted bilaterally Skin: warm and dry Neuro:  CNs 2-12 intact, no focal abnormalities noted Psych: Normal affect  WUJ:WJXBJ rhythm, heart rate 66, incomplete right bundle branch block, no significant change when compared to prior tracings  ASSESSMENT AND PLAN:

## 2011-08-28 ENCOUNTER — Ambulatory Visit (HOSPITAL_COMMUNITY)
Admission: RE | Admit: 2011-08-28 | Discharge: 2011-08-28 | Disposition: A | Discharge status: Home / Self Care | Payer: 59 | Source: Ambulatory Visit | Attending: Cardiovascular Disease | Admitting: Cardiovascular Disease

## 2011-08-28 DIAGNOSIS — I2510 Atherosclerotic heart disease of native coronary artery without angina pectoris: Secondary | ICD-10-CM | POA: Insufficient documentation

## 2011-08-28 DIAGNOSIS — E785 Hyperlipidemia, unspecified: Secondary | ICD-10-CM | POA: Insufficient documentation

## 2011-08-28 DIAGNOSIS — I209 Angina pectoris, unspecified: Secondary | ICD-10-CM | POA: Insufficient documentation

## 2011-08-28 DIAGNOSIS — Z9861 Coronary angioplasty status: Secondary | ICD-10-CM | POA: Insufficient documentation

## 2011-08-28 DIAGNOSIS — I10 Essential (primary) hypertension: Secondary | ICD-10-CM | POA: Insufficient documentation

## 2011-08-28 LAB — POCT ACTIVATED CLOTTING TIME: Activated Clotting Time: 226 seconds

## 2011-08-29 ENCOUNTER — Encounter (HOSPITAL_COMMUNITY): Payer: 59

## 2011-08-31 ENCOUNTER — Encounter (HOSPITAL_COMMUNITY): Payer: 59

## 2011-08-31 ENCOUNTER — Ambulatory Visit (INDEPENDENT_AMBULATORY_CARE_PROVIDER_SITE_OTHER): Payer: 59 | Admitting: Cardiology

## 2011-08-31 ENCOUNTER — Encounter: Payer: Self-pay | Admitting: *Deleted

## 2011-08-31 ENCOUNTER — Encounter: Payer: Self-pay | Admitting: Cardiology

## 2011-08-31 DIAGNOSIS — I10 Essential (primary) hypertension: Secondary | ICD-10-CM

## 2011-08-31 DIAGNOSIS — I2510 Atherosclerotic heart disease of native coronary artery without angina pectoris: Secondary | ICD-10-CM

## 2011-08-31 NOTE — Progress Notes (Signed)
HPI   Patient is seen for followup coronary artery disease.  I had seen the patient very recently following her coronary status.  She was at cardiac rehabilitation and had some discomfort and was seen in the office by Tereso Newcomer.  There is a very careful and complete note.  Decision was made to proceed with catheterization as a relook study.  This was done on August 28, 2011 by Dr. Excell Seltzer.  The LAD had a widely patent stent.  Beyond the stent there was a 40-50% stenosis.  Further down the LAD there was no significant stenosis.  The first diagonal originating from the stent is small with 50-70% ostial lesion.  Pressure wire analysis of the LAD yielded an FFR of 0.9 to.  This was a good result.  Therefore there was patency of the LAD stent and the other diseases nonobstructive.  The patient was placed on a very low dose of Imdur.  She has had some mild headache but this is improving.  She has not had any recurrent chest discomfort. Allergies  Allergen Reactions  . Erythromycin   . Indomethacin   . Penicillins     Current Outpatient Prescriptions  Medication Sig Dispense Refill  . albuterol (PROVENTIL,VENTOLIN) 90 MCG/ACT inhaler Inhale 2 puffs into the lungs every 6 (six) hours as needed.        Marland Kitchen amLODipine (NORVASC) 2.5 MG tablet Take 2.5 mg by mouth daily.        Marland Kitchen aspirin 81 MG tablet Take 81 mg by mouth daily.        Marland Kitchen atorvastatin (LIPITOR) 20 MG tablet Take 1 tablet (20 mg total) by mouth daily.      . benazepril (LOTENSIN) 5 MG tablet Take 5 mg by mouth daily.        . calcium carbonate 200 MG capsule Take 250 mg by mouth daily.        . clopidogrel (PLAVIX) 75 MG tablet Take 75 mg by mouth daily.        . cyclobenzaprine (FLEXERIL) 10 MG tablet Take 10 mg by mouth 3 (three) times daily as needed.        . DULoxetine (CYMBALTA) 30 MG capsule 3 tabs po qd      . ergocalciferol (VITAMIN D2) 50000 UNITS capsule Take 50,000 Units by mouth once a week.        . metoprolol (TOPROL-XL) 50 MG  24 hr tablet Take 50 mg by mouth daily.        . Multiple Vitamin (MULTIVITAMIN) tablet Take 1 tablet by mouth daily.        . nitroGLYCERIN (NITROSTAT) 0.4 MG SL tablet Place 0.4 mg under the tongue every 5 (five) minutes as needed.        . pregabalin (LYRICA) 75 MG capsule 3 tabs morning and 3 tabs qhs      . traMADol (ULTRAM) 50 MG tablet Take 50 mg by mouth 3 (three) times daily.        . vitamin C (ASCORBIC ACID) 500 MG tablet Take 500 mg by mouth daily.          History   Social History  . Marital Status: Married    Spouse Name: N/A    Number of Children: 2  . Years of Education: N/A   Occupational History  . 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 Smoking status: Never Smoker   . Smokeless tobacco: Not on file  . Alcohol Use:  Yes     occasional  . Drug Use: No  . Sexually Active: Not on file   Other Topics Concern  . Not on file   Social History Narrative  . No narrative on file    Family History  Problem Relation Age of Onset  . Other Mother     died pulmonary issues  . Coronary artery disease Father     died- CABG-HTN- AAA    Past Medical History  Diagnosis Date  . CAD (coronary artery disease)     DES to the LAD July 06, 2011  / Relook catheter July 14, 2011 stent patent, moderate mid LAD stenosis but not high grade , plan medical therapy  . Hypertension   . Hyperlipidemia   . Borderline diabetes   . Morbid obesity   . Fibromyalgia   . Osteoarthritis   . Degenerative joint disease   . H/O: hysterectomy   . Groin hematoma     July 06 2011 treated with pressure  . Hematoma     Radial catheter site July 13, 2011    Past Surgical History  Procedure Date  . Cardiac catheterization     revealing patency of the LAD stent with mild compromise of  the diagonal and moderate mid LAD stenosis, but does not appear to  be high grade.   . Cholecystectomy   . Tonsillectomy   . Carpal tunnel release     ROS  Patient denies fever,  chills, headache, sweats, rash, change in vision, change in hearing, cough, nausea vomiting, urinary symptoms.  All other systems are reviewed and are negative.  PHYSICAL EXAM Patient is stable today.  The catheter site in her right radial artery is stable.  There is no jugular venous distention.  Lungs are clear.  Respiratory effort is nonlabored.  Cardiac exam reveals S1 and S2.  There are no clicks or significant murmurs.  The abdomen is soft.  There is no peripheral edema. Filed Vitals:   08/31/11 1135  BP: 112/64  Pulse: 85  Height: 5\' 4"  (1.626 m)  Weight: 217 lb 12.8 oz (98.793 kg)    EEKG is done today and reviewed by me.There is no significant change. ASSESSMENT & PLAN

## 2011-08-31 NOTE — Assessment & Plan Note (Signed)
Blood pressure stable. No change in therapy. 

## 2011-08-31 NOTE — Assessment & Plan Note (Signed)
As outlined in the history of present illness the patient did have another followup catheter.  There is no significant obstructive stenosis.  A very small dose of Imdur or appears to help her.  She will continue this and return to activities.

## 2011-08-31 NOTE — Cardiovascular Report (Signed)
[name], Courtney Steele                 ACCOUNT NO.:  [id number]  MEDICAL RECORD NO.:  [id number]  LOCATION:  REHS                         FACILITY:  [facility]  PHYSICIAN:  Veverly Fells. Excell Seltzer, MD  DATE OF BIRTH:  12/21/1950  DATE OF PROCEDURE:  08/28/2011 DATE OF DISCHARGE:                           CARDIAC CATHETERIZATION   PROCEDURE:  Pressure wire analysis of the left anterior descending.  PROCEDURAL INDICATIONS:  Ms. Thieme is a 60 year old woman with coronary artery disease.  She underwent recent stenting of the LAD by Dr. Clifton James.  She continued to have recurrent angina.  She had a diagnostic catheterization by Dr. Riley Kill on July 13, 2011.  She has had persistent exertional angina with low-level exertion during cardiac rehab.  Her symptoms are very typical.  She was seen yesterday by Tereso Newcomer in the office and her cath films were reviewed.  It was recommended that she have pressure wire analysis of the LAD.  The patient's stent was widely patent on her previous angiogram, but downstream to the stent there was 50% stenosis present.  Because of her continued angina, we elected to proceed with pressure wire analysis as has had been recommended.  Risks and indications of the procedure were reviewed with the patient. Informed consent was obtained.  The right wrist was prepped, draped, and anesthetized with 1% lidocaine.  Using modified Seldinger technique, a 5- French sheath was placed in the right radial artery without difficulty. 5000 units of unfractionated heparin was administered.  3 mg of verapamil was given through the sheath.  A 5-French JL-3.5 cm guide catheter was inserted and angiography was performed.  This demonstrated continued patency of her proximal LAD stent.  There was mild stenosis of ostial diagonal branch that originated from the stent.  The branch was very small.  Downstream from the stented segment, there was diffuse 40- 50% stenosis in the mid LAD.  Once a  therapeutic ACT was achieved, a pressure wire was advanced through the stent into the distal LAD.  This was done without difficulty.  An additional 1000 units of intravenous unfractionated heparin had to be given.  The baseline FFR before adenosine was 0.99.  The inducible FFR with intravenous adenosine was 0.92.  The wire was removed.  The patient remained hemodynamically stable and the procedure was well tolerated.  PROCEDURAL FINDINGS:  Angiography demonstrates stable appearance of the patient's coronary anatomy.  Her left main and left circumflex are widely patent.  The LAD has a widely patent stent and beyond the stent in the mid LAD there was 40-50% irregular stenosis, but further down in the mid and distal LAD there was no significant stenosis noted.  The first diagonal branch originating from the stent is a small vessel that has 50-70% ostial stenosis.  As above, pressure wire analysis of the LAD yielded an FFR of 0.92 at peak hyperemia.  FINAL ASSESSMENT:  Continued patency of the proximal left anterior descending stent with nonobstructive coronary artery disease and normal pressure wire analysis of the left anterior descending.     Veverly Fells. Excell Seltzer, MD     MDC/MEDQ  D:  08/28/2011  T:  08/29/2011  Job:  [id number]  Electronically Signed by Tonny Bollman MD on 08/31/2011 09:17:57 PM

## 2011-08-31 NOTE — Patient Instructions (Addendum)
Your physician recommends that you schedule a follow-up appointment in: KEEP NOV 15 ,2012 APPT Your physician recommends that you continue on your current medications as directed. Please refer to the Current Medication list given to you today.

## 2011-09-03 ENCOUNTER — Encounter (HOSPITAL_COMMUNITY): Payer: 59

## 2011-09-03 LAB — DIFFERENTIAL
Basophils Absolute: 0.1
Basophils Relative: 1
Eosinophils Absolute: 0.1
Eosinophils Relative: 1
Lymphs Abs: 2.1

## 2011-09-03 LAB — LIPASE, BLOOD: Lipase: 17

## 2011-09-03 LAB — CBC
HCT: 37
Hemoglobin: 12.7
MCV: 85.5
Platelets: 270
RBC: 4.33
WBC: 10.1

## 2011-09-03 LAB — URINALYSIS, ROUTINE W REFLEX MICROSCOPIC
Ketones, ur: 15 — AB
Nitrite: NEGATIVE
pH: 6

## 2011-09-03 LAB — COMPREHENSIVE METABOLIC PANEL
Alkaline Phosphatase: 58
BUN: 15
CO2: 28
Chloride: 103
Creatinine, Ser: 0.76
GFR calc non Af Amer: >60
Glucose, Bld: 113 — ABNORMAL HIGH
Potassium: 3.8
Total Bilirubin: 1.4 — ABNORMAL HIGH

## 2011-09-04 ENCOUNTER — Telehealth: Payer: Self-pay | Admitting: Cardiology

## 2011-09-04 NOTE — Telephone Encounter (Signed)
Pt calling needing an increase Midur. Pt takes 1/2 pill once a day. Pt said she had to take nitro 5 different times, with a total of 10 nitro. Therefore pt thinks 1/2 pill of Midur might not be sufficient. Please return pt call to discuss further.

## 2011-09-05 ENCOUNTER — Telehealth: Payer: Self-pay | Admitting: *Deleted

## 2011-09-05 ENCOUNTER — Encounter (HOSPITAL_COMMUNITY): Payer: 59

## 2011-09-05 NOTE — Telephone Encounter (Signed)
Pt calling c/o CP--states she has taken a lot of NTG for this pain and refuses to go to ED as she states she has had this pain  along time and if she were diaphoresing or having N/V and CP she would" get her butt to the ED"--States she thinks if she could increase her imdur to a full dose that would ease pain--advised--- i spoke to dr Clifton James and he agreed to increase in imdur--advised if pain continues please go to nearest ED--Pt works at [employer] and agrees to go to ED if pain continues or becomes worse---nt

## 2011-09-05 NOTE — Telephone Encounter (Signed)
PER PT MESSAGE HAS BEEN ADDRESSED BY Nelva TERBECK  RN./CY

## 2011-09-05 NOTE — Telephone Encounter (Signed)
Pt calling asking that someone return pt call at work. Please return call. Pt said it looks like someone tried to call her yesterday but there is no msg on answering machine.

## 2011-09-07 ENCOUNTER — Encounter (HOSPITAL_COMMUNITY): Payer: 59

## 2011-09-10 ENCOUNTER — Telehealth: Payer: Self-pay | Admitting: Cardiology

## 2011-09-10 ENCOUNTER — Encounter (HOSPITAL_COMMUNITY): Payer: 59 | Attending: Cardiology

## 2011-09-10 DIAGNOSIS — Z888 Allergy status to other drugs, medicaments and biological substances status: Secondary | ICD-10-CM | POA: Insufficient documentation

## 2011-09-10 DIAGNOSIS — I10 Essential (primary) hypertension: Secondary | ICD-10-CM | POA: Insufficient documentation

## 2011-09-10 DIAGNOSIS — E785 Hyperlipidemia, unspecified: Secondary | ICD-10-CM | POA: Insufficient documentation

## 2011-09-10 DIAGNOSIS — M1990 Unspecified osteoarthritis, unspecified site: Secondary | ICD-10-CM | POA: Insufficient documentation

## 2011-09-10 DIAGNOSIS — I200 Unstable angina: Secondary | ICD-10-CM | POA: Insufficient documentation

## 2011-09-10 DIAGNOSIS — I2510 Atherosclerotic heart disease of native coronary artery without angina pectoris: Secondary | ICD-10-CM | POA: Insufficient documentation

## 2011-09-10 DIAGNOSIS — Z7982 Long term (current) use of aspirin: Secondary | ICD-10-CM | POA: Insufficient documentation

## 2011-09-10 DIAGNOSIS — Z880 Allergy status to penicillin: Secondary | ICD-10-CM | POA: Insufficient documentation

## 2011-09-10 DIAGNOSIS — Z9861 Coronary angioplasty status: Secondary | ICD-10-CM | POA: Insufficient documentation

## 2011-09-10 DIAGNOSIS — Z881 Allergy status to other antibiotic agents status: Secondary | ICD-10-CM | POA: Insufficient documentation

## 2011-09-10 DIAGNOSIS — Z8249 Family history of ischemic heart disease and other diseases of the circulatory system: Secondary | ICD-10-CM | POA: Insufficient documentation

## 2011-09-10 DIAGNOSIS — Z79899 Other long term (current) drug therapy: Secondary | ICD-10-CM | POA: Insufficient documentation

## 2011-09-10 DIAGNOSIS — Z7902 Long term (current) use of antithrombotics/antiplatelets: Secondary | ICD-10-CM | POA: Insufficient documentation

## 2011-09-10 DIAGNOSIS — Z5189 Encounter for other specified aftercare: Secondary | ICD-10-CM | POA: Insufficient documentation

## 2011-09-10 LAB — URINALYSIS, ROUTINE W REFLEX MICROSCOPIC
Bilirubin Urine: NEGATIVE
Hgb urine dipstick: NEGATIVE
Specific Gravity, Urine: 1.012
Urobilinogen, UA: 0.2

## 2011-09-10 NOTE — Telephone Encounter (Signed)
Pt has been having chest pain off and on all day and she has taken 3 nitro since 1pm no other symptoms

## 2011-09-10 NOTE — Telephone Encounter (Signed)
Per telephone call from pt,  Reports she had chest pain yesterday X 1 that was relieved with 1 SL NTG.  Today she started having chest pain about 12:30 or 1 pm (after lunch)for which she reports taking SL NTG X 3 throughout the afternoon.  She reports that the chest pain was a squeezing pressure that radiated into her back between her shoulder blades, neck and down both arms.  She denies having any chest pain at this time.  Pt was scheduled for an appointment for follow up evaluation with Dr Myrtis Ser.  She is advised to use SL Ntg as instructed for CP and to report to ED at [facility] should cp not be resolved with SL NTG.  Of note, she did complete cardiac rehab today without any chest pain and it did not start until after her lunch.

## 2011-09-11 LAB — CBC
Hemoglobin: 13
MCHC: 33.9
MCHC: 34.2
MCV: 87.2
MCV: 87.2
Platelets: 177
Platelets: 194
RBC: 3.91
RBC: 4.4
RDW: 14.7
WBC: 6.9

## 2011-09-11 LAB — COMPREHENSIVE METABOLIC PANEL
CO2: 29
Calcium: 9.5
Creatinine, Ser: 0.69
GFR calc non Af Amer: >60
Glucose, Bld: 110 — ABNORMAL HIGH

## 2011-09-11 LAB — DIFFERENTIAL
Lymphocytes Relative: 41
Lymphs Abs: 2.3
Neutrophils Relative %: 47

## 2011-09-11 LAB — GLUCOSE, CAPILLARY
Glucose-Capillary: 109 — ABNORMAL HIGH
Glucose-Capillary: 126 — ABNORMAL HIGH

## 2011-09-11 LAB — URINALYSIS, ROUTINE W REFLEX MICROSCOPIC
Glucose, UA: NEGATIVE
Protein, ur: NEGATIVE
pH: 5.5

## 2011-09-12 ENCOUNTER — Encounter (HOSPITAL_COMMUNITY): Payer: 59

## 2011-09-14 ENCOUNTER — Encounter (HOSPITAL_COMMUNITY)
Admission: RE | Admit: 2011-09-14 | Discharge: 2011-09-14 | Payer: 59 | Source: Ambulatory Visit | Attending: Cardiology | Admitting: Cardiology

## 2011-09-17 ENCOUNTER — Ambulatory Visit (INDEPENDENT_AMBULATORY_CARE_PROVIDER_SITE_OTHER): Payer: 59 | Admitting: Cardiology

## 2011-09-17 ENCOUNTER — Encounter (HOSPITAL_COMMUNITY): Payer: 59

## 2011-09-17 ENCOUNTER — Encounter: Payer: Self-pay | Admitting: Cardiology

## 2011-09-17 DIAGNOSIS — I10 Essential (primary) hypertension: Secondary | ICD-10-CM

## 2011-09-17 DIAGNOSIS — I2510 Atherosclerotic heart disease of native coronary artery without angina pectoris: Secondary | ICD-10-CM

## 2011-09-17 MED ORDER — ISOSORBIDE MONONITRATE ER 30 MG PO TB24: 30.0000 mg | ORAL_TABLET | Freq: Every day | ORAL | Status: DC

## 2011-09-17 NOTE — Assessment & Plan Note (Signed)
The patient continues to be bothered by chest discomfort.  Her symptoms are concerning.  It is possible that spasm may be playing a role.  She did not have a definite infarct.  Therefore I am comfortable stopping her beta blocker.  We will start by lowering the dose for a few days and then stopping the beta blocker completely.  I will then see her for followup.  After that time we will consider up titration of her nitrate and amlodipine.  If she has blood pressure problems or further headaches we will consider changing the amlodipine to Cardizem.

## 2011-09-17 NOTE — Progress Notes (Signed)
HPI Patient is seen for followup of her coronary artery disease.  Unfortunately she continues to be plagued by episodes of chest discomfort note that she initially had a tight LAD.  She did not have an MI.  She has had no followup catheterizations.  Her LAD stent is widely patent.  There is a 40-50% stenosis after the stent.  There is a 50-70% ostial lesion of a first diagonal is small.  The FFR of her LAD was 0.9 which is good.  Despite this she continues to have episodes of chest discomfort.  She's not having nausea vomiting or diaphoresis.  These episodes are not exertional.  She is on a beta blocker.  She is also on him nor and a small dose of Norvasc.   Allergies  Allergen Reactions  . Erythromycin   . Indomethacin   . Penicillins     Current Outpatient Prescriptions  Medication Sig Dispense Refill  . albuterol (PROVENTIL,VENTOLIN) 90 MCG/ACT inhaler Inhale 2 puffs into the lungs every 6 (six) hours as needed.        Marland Kitchen amLODipine (NORVASC) 2.5 MG tablet Take 2.5 mg by mouth daily.        Marland Kitchen aspirin 81 MG tablet Take 81 mg by mouth daily.        Marland Kitchen atorvastatin (LIPITOR) 20 MG tablet Take 1 tablet (20 mg total) by mouth daily.      . benazepril (LOTENSIN) 5 MG tablet Take 5 mg by mouth daily.        . calcium carbonate 200 MG capsule Take 250 mg by mouth daily.        . clopidogrel (PLAVIX) 75 MG tablet Take 75 mg by mouth daily.        . cyclobenzaprine (FLEXERIL) 10 MG tablet Take 10 mg by mouth 3 (three) times daily as needed.        . DULoxetine (CYMBALTA) 30 MG capsule 3 tabs po qd      . ergocalciferol (VITAMIN D2) 50000 UNITS capsule Take 50,000 Units by mouth once a week.        . isosorbide mononitrate (IMDUR) 30 MG 24 hr tablet Take 30 mg by mouth daily.        . metoprolol (TOPROL-XL) 50 MG 24 hr tablet Take 50 mg by mouth daily.        . Multiple Vitamin (MULTIVITAMIN) tablet Take 1 tablet by mouth daily.        . nitroGLYCERIN (NITROSTAT) 0.4 MG SL tablet Place 0.4 mg under  the tongue every 5 (five) minutes as needed.        . pregabalin (LYRICA) 75 MG capsule 3 tabs morning and 3 tabs qhs      . traMADol (ULTRAM) 50 MG tablet Take 50 mg by mouth 3 (three) times daily.        . vitamin C (ASCORBIC ACID) 500 MG tablet Take 500 mg by mouth daily.          History   Social History  . Marital Status: Married    Spouse Name: N/A    Number of Children: 2  . Years of Education: N/A   Occupational History  . 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 Smoking status: Never Smoker   . Smokeless tobacco: Not on file  . Alcohol Use: Yes     occasional  . Drug Use: No  . Sexually Active: Not on file   Other Topics Concern  . Not  on file   Social History Narrative  . No narrative on file    Family History  Problem Relation Age of Onset  . Other Mother     died pulmonary issues  . Coronary artery disease Father     died- CABG-HTN- AAA    Past Medical History  Diagnosis Date  . CAD (coronary artery disease)     DES to the LAD July 06, 2011  / Relook catheter July 14, 2011 stent patent, moderate mid LAD stenosis but not high grade , plan medical therapy  . Hypertension   . Hyperlipidemia   . Borderline diabetes   . Morbid obesity   . Fibromyalgia   . Osteoarthritis   . Degenerative joint disease   . H/O: hysterectomy   . Groin hematoma     July 06 2011 treated with pressure  . Hematoma     Radial catheter site July 13, 2011    Past Surgical History  Procedure Date  . Cardiac catheterization     revealing patency of the LAD stent with mild compromise of  the diagonal and moderate mid LAD stenosis, but does not appear to  be high grade.   . Cholecystectomy   . Tonsillectomy   . Carpal tunnel release     ROS  Patient denies fever, chills, headache, sweats, rash, change in vision, change in hearing, cough, nausea vomiting, urinary symptoms.  All other systems are reviewed and are negative.  PHYSICAL EXAM Patient is stable  at this time.  She is overweight.  Head is atraumatic.  There is no jugular venous distention.  Lungs are clear.  Respiratory effort is nonlabored.  Cardiac exam reveals S1 and S2.  No clicks or significant murmurs.  There is no peripheral edema. Filed Vitals:   09/17/11 1613  BP: 132/80  Pulse: 87  Height: 5' 4.5" (1.638 m)  Weight: 217 lb (98.431 kg)    EKG is not done today.  ASSESSMENT & PLAN

## 2011-09-17 NOTE — Assessment & Plan Note (Signed)
Blood pressure is well controlled. No change in therapy. 

## 2011-09-17 NOTE — Patient Instructions (Signed)
Your physician recommends that you schedule a follow-up appointment in: 10/02/2011

## 2011-09-19 ENCOUNTER — Telehealth: Payer: Self-pay | Admitting: Cardiology

## 2011-09-19 ENCOUNTER — Encounter (HOSPITAL_COMMUNITY): Payer: 59

## 2011-09-19 NOTE — Telephone Encounter (Signed)
Pt said she cannot go back cardio rehab class until dr Myrtis Ser sends a note that she can go please call

## 2011-09-19 NOTE — Telephone Encounter (Signed)
SPOKE WITH PT  STATED DR KATZ OKAYED  FOR HER TO GO BACK TO CARDIAC REHAB AT LAST OFFICE VISIT NO DOCUMENTAION NOTED  INFORMED PT WILL FORWARD  TO DR Myrtis Ser  FOR DISCUSSION.PT AWARE WILL  HAVE ANSWER TOM  PLEASE CALL PT  AT HOME NUMBER  TO LET KNOW WHEN MESSAGE ADDRESSED .Zack Seal

## 2011-09-20 ENCOUNTER — Telehealth: Payer: Self-pay | Admitting: *Deleted

## 2011-09-20 LAB — POCT RAPID STREP A: Streptococcus, Group A Screen (Direct): NEGATIVE

## 2011-09-20 NOTE — Telephone Encounter (Signed)
Spoke with patient, she said can not go back to Cardiac rehab until Dr. Myrtis Ser send note. Okay for patient to go back to cardiac rehab per Dr. Myrtis Ser MD. Patient aware.  Will fax note to [facility].

## 2011-09-20 NOTE — H&P (Signed)
NAMEMANIKA, Courtney Steele                 ACCOUNT NO.:  [id number]  MEDICAL RECORD NO.:  [id number]  LOCATION:  2927                         FACILITY:  [facility]  PHYSICIAN:  Doylene Canning. Ladona Ridgel, MD    DATE OF BIRTH:  [birth year]  DATE OF ADMISSION:  07/12/2011 DATE OF DISCHARGE:                             HISTORY & PHYSICAL   PRIMARY CARDIOLOGIST:  Luis Abed, MD, [facility]  PATIENT PROFILE:  This is a 60 year old female with a recent admission for unstable angina and found to have LAD stenosis that was treated with drug-eluting stent who presents with recurrent angina.  PROBLEM LIST: 1. Unstable angina/coronary artery disease.     a.     History of nonobstructive catheterization in August 2002.     b.     Lexiscan Myoview in October 11, 2009, showing no evidence of      ischemia or infarct with an ejection fraction of 60%.     c.     On July 06, 2011, cardiac catheterization, left main normal,      left anterior descending 90% proximal, left circumflex nondominant      and normal, right coronary artery dominant and normal, ejection      fraction of 60%.  The left anterior descending was stented with      3.5 x 12 mm Resolute Integrity drug-eluting stent. 2. Post-percutaneous coronary intervention right groin hematoma. 3. Hypertension. 4. Hyperlipidemia. 5. Borderline diabetes mellitus. 6. Morbid obesity. 7. Cholelithiasis status post cholecystectomy in 2009. 8. Fibromyalgia. 9. Osteoarthritis. 10.Degenerative joint disease. 11.Status post hysterectomy. 12.Status post tonsillectomy. 13.Status post carpal tunnel surgery. 14.Status post left meniscus tear and repair. 15.History of right elbow pain, status post ulnar nerve release.  ALLERGIES: 1. PENICILLIN. 2. ERYTHROMYCIN. 3. INDOMETHACIN. 4. BIAXIN.  HISTORY OF PRESENT ILLNESS:  This is a 60 year old female with above problem list.  The patient was recently admitted to [facility] from July 05, 2011, through July 08, 2011,  secondary to unstable angina with catheterization performed during admission reveling proximal LAD disease which was status post stent with drug-eluting stent.  The patient tolerated the procedure well, but postprocedure developed right groin hematoma which eventually resolved.  Right groin ultrasound showed no evidence of pseudoaneurysm or AV fistula.  Over the past week, the patient has had occasional twinges of chest discomfort lasting just a few seconds and resolved spontaneously. Today, however, she went to water aerobics for the first time since discharge and while in the water for about 15-20 minutes had sudden onset of substernal chest tightness with dyspnea similar to prior angina.  She stopped exercising, but symptoms persisted and then eventually she somewhat noticed that she was in some distress and EMS was called.  Upon their arrival, she was taken out of the pool and given sublingual nitroglycerin with immediate relief of symptoms.  She was taken to the [facility] ED where ECG was not acute and point-of-caremarkers so far negative.  We have been asked to evaluate.  Here in the ED, she has had occasional twinges of chest discomfort and has been treated with nitrates successfully.  HOME MEDICATIONS: 1. Calcium carbonate plus D 1 tablet daily. 2.  Lyrica 75 mg 3 capsules b.i.d. 3. Nitroglycerin 0.4 mg sublingual p.r.n. chest pain. 4. Vitamin D2 one tablet daily. 5. Vitamin C 1 tablet daily. 6. Metoprolol XL 25 mg daily. 7. Multivitamin daily. 8. Cymbalta 30 mg 2 capsules daily. 9. Cyclobenzaprine 10 mg t.i.d. p.r.n. 10.Benazepril 5 mg daily. 11.Albuterol inhaler 1-2 puffs q.4 h. p.r.n. 12.Tramadol 50 mg t.i.d. 13.Simvastatin 40 mg q.p.m. 14.Plavix 75 mg daily. 15.Aspirin 81 mg daily.  FAMILY HISTORY:  Mother died of pulmonary issues.  Father died with a history of CAD, CABG, hypertension, and abdominal aortic aneurysm.  She is the only child.  SOCIAL HISTORY:  The  patient lives in [place] with her husband.  She works as an Charity fundraiser at [employer].  She has 2 grown children.  She denies tobacco or drug use.  She has an occasional alcoholic beverage and does try to water aerobics.  This has not been regular.  REVIEW OF SYSTEMS:  Positive for chest pain, dyspnea with exertion as outlined in the HPI, otherwise all systems are reviewed and are negative.  PHYSICAL EXAMINATION:  VITAL SIGNS:  Temperature 98.1, heart rate 60, respirations 16, blood pressure 114/64, and pulse ox 98% on 2 liters. GENERAL:  A pleasant white female in no acute distress, awake, alert, and oriented x3.  She has a normal affect. HEENT:  Normal. NEURO:  Grossly intact and nonfocal. SKIN:  Warm and dry without lesions or masses. NECK:  Supple without bruits or JVD. LUNGS:  Respirations are regular and unlabored.  Clear to auscultation. CARDIAC:  Regular S1 and S2.  No S3, S4, or murmurs. ABDOMEN:  Round, soft, nontender, and nondistended.  Bowel sounds present x4. EXTREMITIES:  Warm, dry, and pink.  No clubbing, cyanosis, or edema. Dorsalis pedis and posterior tibial pulses are 2+ bilaterally.  Right radial is 2+ with somewhat sluggish Allen test.  Chest x-ray was normal.  EKG shows sinus rhythm, rate 78, normal axis, no acute ST-T changes.  Hemoglobin 12.1, hematocrit 36.2, WBC 6.2, and platelets 161.  Sodium 139, potassium 3.8, chloride 102, CO2 of 28, BUN 20, creatinine 0.52, and glucose 98.  Troponin I less than 0.30.  ASSESSMENT/PLAN: 1. Unstable angina.  The patient with recurrent exertional chest pain     exactly similar to prior angina.  She is status post drug-eluting     stent to the LAD.  ECG is not acute.  So far, enzymes are negative.     We will plan to admit and cycle enzymes.  We will add heparin and     continue home meds.  Check P2Y12. 2. Hypertension, stable.  Continue her home meds. 3. Hyperlipidemia.  Continue statin therapy. 4. Borderline diabetes  mellitus, diet controlled.  Continue aspirin,     statin, and ACE inhibitor.     Nicolasa Ducking, ANP   ______________________________ Doylene Canning. Ladona Ridgel, MD    CB/MEDQ  D:  07/12/2011  T:  07/13/2011  Job:  [id number]  Electronically Signed by Nicolasa Ducking ANP on 07/20/2011 03:34:15 PM Electronically Signed by Lewayne Bunting MD on 09/20/2011 06:35:06 PM

## 2011-09-21 ENCOUNTER — Encounter (HOSPITAL_COMMUNITY): Payer: 59

## 2011-09-24 ENCOUNTER — Encounter (HOSPITAL_COMMUNITY): Payer: 59

## 2011-09-26 ENCOUNTER — Encounter (HOSPITAL_COMMUNITY): Payer: 59

## 2011-09-26 NOTE — Telephone Encounter (Signed)
SEE OTHER PHONE NOTE./CY 

## 2011-09-28 ENCOUNTER — Encounter (HOSPITAL_COMMUNITY): Payer: 59

## 2011-10-01 ENCOUNTER — Encounter (HOSPITAL_COMMUNITY): Payer: 59

## 2011-10-02 ENCOUNTER — Encounter: Payer: Self-pay | Admitting: Cardiology

## 2011-10-02 ENCOUNTER — Ambulatory Visit (INDEPENDENT_AMBULATORY_CARE_PROVIDER_SITE_OTHER): Payer: 59 | Admitting: Cardiology

## 2011-10-02 DIAGNOSIS — IMO0001 Reserved for inherently not codable concepts without codable children: Secondary | ICD-10-CM

## 2011-10-02 DIAGNOSIS — I10 Essential (primary) hypertension: Secondary | ICD-10-CM

## 2011-10-02 DIAGNOSIS — I2510 Atherosclerotic heart disease of native coronary artery without angina pectoris: Secondary | ICD-10-CM

## 2011-10-02 DIAGNOSIS — M797 Fibromyalgia: Secondary | ICD-10-CM

## 2011-10-02 MED ORDER — RANOLAZINE ER 500 MG PO TB12: 500.0000 mg | ORAL_TABLET | Freq: Two times a day (BID) | ORAL | Status: DC

## 2011-10-02 NOTE — Patient Instructions (Signed)
Will start Ranexa 500 mg twice daily and if you have any problems with this please call.  Your physician recommends that you schedule a follow-up appointment in: 3 weeks

## 2011-10-02 NOTE — Assessment & Plan Note (Signed)
It is possible that fibromyalgia plays a roll here.  I will talk to her about other approaches that may be taken concerning her pain

## 2011-10-02 NOTE — Progress Notes (Signed)
HPI Patient is seen today for followup coronary artery disease.  The patient received a drug-eluting stent to the LAD in July, 2012.  She'll follow up catheterization in July 14, 2011 and the stent was patent.  There was moderate LAD disease but was not felt to be high grade.  She then had more symptoms and a catheter was done again on August 28 2011.  The LAD stent widely patent.  Pressure wire analysis revealed an FFR of 0.9 which is normal.  It was felt that she did not have obstructive disease at that time.  Low-dose Imdur was started.  She had some mild headaches but is doing better in this regard.  When I saw her last decided to stop her beta blocker for the possibility that there could be any type of spasm component.  I also put her on a small dose of Norvasc.  She is not feeling any better with this.  She is having recurrent chest discomfort.  It occurs during the day with minimal exertion.  She says nitroglycerin does help.   Allergies  Allergen Reactions  . Erythromycin   . Indomethacin   . Penicillins     Current Outpatient Prescriptions  Medication Sig Dispense Refill  . albuterol (PROVENTIL,VENTOLIN) 90 MCG/ACT inhaler Inhale 2 puffs into the lungs every 6 (six) hours as needed.        Marland Kitchen amLODipine (NORVASC) 2.5 MG tablet Take 2.5 mg by mouth daily.        Marland Kitchen aspirin 81 MG tablet Take 81 mg by mouth daily.        Marland Kitchen atorvastatin (LIPITOR) 20 MG tablet Take 1 tablet (20 mg total) by mouth daily.      . benazepril (LOTENSIN) 5 MG tablet Take 5 mg by mouth daily.        . calcium carbonate 200 MG capsule Take 250 mg by mouth daily.        . clopidogrel (PLAVIX) 75 MG tablet Take 75 mg by mouth daily.        . cyclobenzaprine (FLEXERIL) 10 MG tablet Take 10 mg by mouth 3 (three) times daily as needed.        . DULoxetine (CYMBALTA) 30 MG capsule 3 tabs po qd      . ergocalciferol (VITAMIN D2) 50000 UNITS capsule Take 50,000 Units by mouth once a week.        . isosorbide mononitrate  (IMDUR) 30 MG 24 hr tablet Take 1 tablet (30 mg total) by mouth daily.  90 tablet  4  . Multiple Vitamin (MULTIVITAMIN) tablet Take 1 tablet by mouth daily.        . nitroGLYCERIN (NITROSTAT) 0.4 MG SL tablet Place 0.4 mg under the tongue every 5 (five) minutes as needed.        . pregabalin (LYRICA) 75 MG capsule 3 tabs morning and 3 tabs qhs      . traMADol (ULTRAM) 50 MG tablet Take 50 mg by mouth 3 (three) times daily.        . vitamin C (ASCORBIC ACID) 500 MG tablet Take 500 mg by mouth daily.        . metoprolol (TOPROL-XL) 50 MG 24 hr tablet Take 50 mg by mouth daily.          History   Social History  . Marital Status: Married    Spouse Name: N/A    Number of Children: 2  . Years of Education: N/A   Occupational History  .  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 Smoking status: Never Smoker   . Smokeless tobacco: Not on file  . Alcohol Use: Yes     occasional  . Drug Use: No  . Sexually Active: Not on file   Other Topics Concern  . Not on file   Social History Narrative  . No narrative on file    Family History  Problem Relation Age of Onset  . Other Mother     died pulmonary issues  . Coronary artery disease Father     died- CABG-HTN- AAA    Past Medical History  Diagnosis Date  . CAD (coronary artery disease)     DES to the LAD July 06, 2011  / Relook catheter July 14, 2011 stent patent, moderate mid LAD stenosis but not high grade , plan medical therapy  . Hypertension   . Hyperlipidemia   . Borderline diabetes   . Morbid obesity   . Fibromyalgia   . Osteoarthritis   . Degenerative joint disease   . H/O: hysterectomy   . Groin hematoma     July 06 2011 treated with pressure  . Hematoma     Radial catheter site July 13, 2011    Past Surgical History  Procedure Date  . Cardiac catheterization     revealing patency of the LAD stent with mild compromise of  the diagonal and moderate mid LAD stenosis, but does not appear to  be  high grade.   . Cholecystectomy   . Tonsillectomy   . Carpal tunnel release     ROS  Patient denies fever, chills, headache, sweats, rash, change in vision, change in hearing, chest pain, cough, nausea vomiting, urinary symptoms.  All other systems are reviewed and are negative. PHYSICAL EXAM Patient is overweight.  She is stable.  Head is atraumatic.  She is oriented to person time and place.  Affect is normal.  There is no jugular venous distention.  Lungs are clear.  Respiratory effort is not labored.  Cardiac exam reveals S1-S2.  There are no clicks or significant murmurs.  Abdomen is soft.  There is no peripheral edema.  No musculoskeletal deformities.  No skin rashes. Filed Vitals:   10/02/11 1453  BP: 116/62  Pulse: 85  Height: 5\' 4"  (1.626 m)  Weight: 215 lb (97.523 kg)    EKG is not done today.  ASSESSMENT & PLAN

## 2011-10-02 NOTE — Assessment & Plan Note (Signed)
Her blood pressure is definitely not elevated this point.  On all of her medications it is not too low.

## 2011-10-02 NOTE — Assessment & Plan Note (Addendum)
Unfortunately the patient continues to have chest discomfort.  I have taken her off her low dose of beta blocker.  Her heart weight is 85, similar to the prior visit.  She is on low-dose Imdur.  She is not having significant headaches each day.  She is on low-dose Norvasc.  Her blood pressure is controlled but not too low at this point.  Options include restarting her metoprolol, increasing the Imdur or does, or increasing the Norvasc dose.  I have decided first to try Ranexa.  She'll be started on 500 mg b.i.d.  I will see her for early followup.  I will then consider the other options.Her QT interval is normal.  Potassium is normal.  She has good renal function.  His dose of medicine should be safe for her.

## 2011-10-03 ENCOUNTER — Telehealth: Payer: Self-pay | Admitting: Cardiology

## 2011-10-03 ENCOUNTER — Encounter (HOSPITAL_COMMUNITY): Payer: 59

## 2011-10-03 NOTE — Telephone Encounter (Signed)
Pt called with info requested from yesterday visit.  Her return to work date was 09-03-11.  If you need any more info please call patient  [phone].

## 2011-10-03 NOTE — Telephone Encounter (Signed)
From completed and will be returned to [employer].

## 2011-10-03 NOTE — Telephone Encounter (Signed)
FMLA Not sent back to [employer], Faxed copy to [facility] @ [phone], Pt Will pick up original copy @ Next OV Visit..  10/03/11/km

## 2011-10-05 ENCOUNTER — Encounter (HOSPITAL_COMMUNITY): Payer: 59

## 2011-10-06 ENCOUNTER — Encounter: Payer: Self-pay | Admitting: Cardiology

## 2011-10-08 ENCOUNTER — Encounter (HOSPITAL_COMMUNITY): Payer: 59

## 2011-10-09 ENCOUNTER — Telehealth: Payer: Self-pay | Admitting: Cardiology

## 2011-10-09 NOTE — Telephone Encounter (Signed)
Per Dr Lorie Apley, pt to see Dr Myrtis Ser on Thursday and call if increase in symptoms.  Pt understands.

## 2011-10-09 NOTE — Telephone Encounter (Signed)
Complaining of dull chest pain most of the day yesterday and a little earlier this am.  She states the pain was resolved within 1-2 minutes yesterday after taking 1-2 ntg.  Today, it resolved on it's own.  The pain is mid sternal radiating to jaw and bil arms sometimes and started while she was sitting at work.  She denies sob, nausea or sweating.

## 2011-10-09 NOTE — Telephone Encounter (Signed)
Pt was having chest pain yesterday, so did not go to cardiac rehab, better today, but wants to know if she needs to increase renexa, pls advise

## 2011-10-10 ENCOUNTER — Encounter (HOSPITAL_COMMUNITY): Payer: 59

## 2011-10-11 ENCOUNTER — Ambulatory Visit (INDEPENDENT_AMBULATORY_CARE_PROVIDER_SITE_OTHER): Payer: 59 | Admitting: Cardiology

## 2011-10-11 ENCOUNTER — Encounter: Payer: Self-pay | Admitting: Cardiology

## 2011-10-11 VITALS — BP 98/62 | HR 82 | Ht 64.0 in | Wt 214.0 lb

## 2011-10-11 DIAGNOSIS — I2510 Atherosclerotic heart disease of native coronary artery without angina pectoris: Secondary | ICD-10-CM

## 2011-10-11 DIAGNOSIS — I10 Essential (primary) hypertension: Secondary | ICD-10-CM

## 2011-10-11 MED ORDER — RANOLAZINE ER 500 MG PO TB12: 1000.0000 mg | ORAL_TABLET | Freq: Two times a day (BID) | ORAL | Status: DC

## 2011-10-11 NOTE — Progress Notes (Signed)
HPI Patient returns today for the followup evaluation of coronary disease.  When I saw her recently we started Ranexa.  We started 500 p.o. B.i.d.  She tells me she still having regular chest pain and using nitroglycerin. Allergies  Allergen Reactions  . Erythromycin   . Indomethacin   . Penicillins     Current Outpatient Prescriptions  Medication Sig Dispense Refill  . albuterol (PROVENTIL,VENTOLIN) 90 MCG/ACT inhaler Inhale 2 puffs into the lungs every 6 (six) hours as needed.        Marland Kitchen amLODipine (NORVASC) 2.5 MG tablet Take 2.5 mg by mouth daily.        Marland Kitchen aspirin 81 MG tablet Take 81 mg by mouth daily.        Marland Kitchen atorvastatin (LIPITOR) 20 MG tablet Take 1 tablet (20 mg total) by mouth daily.      . benazepril (LOTENSIN) 5 MG tablet Take 5 mg by mouth daily.        . calcium carbonate 200 MG capsule Take 250 mg by mouth daily.        . clopidogrel (PLAVIX) 75 MG tablet Take 75 mg by mouth daily.        . cyclobenzaprine (FLEXERIL) 10 MG tablet Take 10 mg by mouth 3 (three) times daily as needed.        . DULoxetine (CYMBALTA) 30 MG capsule 3 tabs po qd      . ergocalciferol (VITAMIN D2) 50000 UNITS capsule Take 50,000 Units by mouth once a week.        . isosorbide mononitrate (IMDUR) 30 MG 24 hr tablet Take 1 tablet (30 mg total) by mouth daily.  90 tablet  4  . metoprolol (TOPROL-XL) 50 MG 24 hr tablet Take 50 mg by mouth daily.        . Multiple Vitamin (MULTIVITAMIN) tablet Take 1 tablet by mouth daily.        . nitroGLYCERIN (NITROSTAT) 0.4 MG SL tablet Place 0.4 mg under the tongue every 5 (five) minutes as needed.        . pregabalin (LYRICA) 75 MG capsule 3 tabs morning and 3 tabs qhs      . ranolazine (RANEXA) 500 MG 12 hr tablet Take 2 tablets (1,000 mg total) by mouth 2 (two) times daily.  60 tablet  3  . traMADol (ULTRAM) 50 MG tablet Take 50 mg by mouth 3 (three) times daily as needed.       . vitamin C (ASCORBIC ACID) 500 MG tablet Take 500 mg by mouth daily.           History   Social History  . Marital Status: Married    Spouse Name: N/A    Number of Children: 2  . Years of Education: N/A   Occupational History  . 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 Smoking status: Never Smoker   . Smokeless tobacco: Not on file  . Alcohol Use: Yes     occasional  . Drug Use: No  . Sexually Active: Not on file   Other Topics Concern  . Not on file   Social History Narrative  . No narrative on file    Family History  Problem Relation Age of Onset  . Other Mother     died pulmonary issues  . Coronary artery disease Father     died- CABG-HTN- AAA    Past Medical History  Diagnosis Date  . CAD (coronary artery disease)  DES to the LAD July 06, 2011  / Relook catheter July 14, 2011 stent patent, moderate mid LAD stenosis but not high grade , plan medical therapy  . Hypertension   . Hyperlipidemia   . Borderline diabetes   . Morbid obesity   . Fibromyalgia   . Osteoarthritis   . Degenerative joint disease   . H/O: hysterectomy   . Groin hematoma     July 06 2011 treated with pressure  . Hematoma     Radial catheter site July 13, 2011    Past Surgical History  Procedure Date  . Cardiac catheterization     revealing patency of the LAD stent with mild compromise of  the diagonal and moderate mid LAD stenosis, but does not appear to  be high grade.   . Cholecystectomy   . Tonsillectomy   . Carpal tunnel release     ROS  Patient denies fever, chills, headache, sweats, rash, change in vision, change in hearing, cough, nausea vomiting, urinary symptoms.  All of the systems are reviewed and are negative. PHYSICAL EXAM Patient is stable.  Venous distention.  Lungs are clear.  Respiratory effort is not labored.  Cardiac exam was S1-S2.  No clicks or significant murmurs.  The abdomen is soft.  No peripheral edema. Filed Vitals:   10/11/11 1506  BP: 98/62  Pulse: 82  Height: 5\' 4"  (1.626 m)  Weight: 214 lb (97.07  kg)  SpO2: 97%    EKG is not done today.  ASSESSMENT & PLAN

## 2011-10-11 NOTE — Assessment & Plan Note (Signed)
Blood pressure is controlled. No change in therapy. 

## 2011-10-11 NOTE — Assessment & Plan Note (Signed)
The patient continues to have chest discomfort.  I continue to try every different options to see if we can control the situation.  At this point we are not making progress.  The next step will be to increase her Ranexa this is being done to 1000 b.i.d. And all see her back sooner.

## 2011-10-11 NOTE — Patient Instructions (Signed)
Your physician recommends that you schedule a follow-up appointment in: October 24, 2011  Your physician has recommended you make the following change in your medication: Increase your Ranexa to 1000mg  twice daily

## 2011-10-12 ENCOUNTER — Encounter (HOSPITAL_COMMUNITY): Payer: 59

## 2011-10-12 ENCOUNTER — Other Ambulatory Visit: Payer: Self-pay | Admitting: *Deleted

## 2011-10-12 DIAGNOSIS — I2510 Atherosclerotic heart disease of native coronary artery without angina pectoris: Secondary | ICD-10-CM

## 2011-10-12 MED ORDER — RANOLAZINE ER 500 MG PO TB12: 1000.0000 mg | ORAL_TABLET | Freq: Two times a day (BID) | ORAL | Status: DC

## 2011-10-15 ENCOUNTER — Encounter (HOSPITAL_COMMUNITY): Payer: 59

## 2011-10-17 ENCOUNTER — Encounter (HOSPITAL_COMMUNITY): Payer: 59

## 2011-10-19 ENCOUNTER — Encounter (HOSPITAL_COMMUNITY): Payer: 59

## 2011-10-22 ENCOUNTER — Encounter (HOSPITAL_COMMUNITY): Payer: 59

## 2011-10-24 ENCOUNTER — Encounter (HOSPITAL_COMMUNITY): Payer: 59

## 2011-10-24 ENCOUNTER — Other Ambulatory Visit: Payer: Self-pay

## 2011-10-24 MED ORDER — NITROGLYCERIN 0.4 MG SL SUBL: 0.4000 mg | SUBLINGUAL_TABLET | SUBLINGUAL | Status: DC | PRN

## 2011-10-24 NOTE — Telephone Encounter (Signed)
..   Requested Prescriptions   Signed Prescriptions Disp Refills  . nitroGLYCERIN (NITROSTAT) 0.4 MG SL tablet 25 tablet 11    Sig: Place 1 tablet (0.4 mg total) under the tongue every 5 (five) minutes as needed.    Authorizing Provider: Myrtis Ser, JEFFREY D    Ordering User: Lacie Scotts   E-scribe to [facility].

## 2011-10-25 ENCOUNTER — Ambulatory Visit (INDEPENDENT_AMBULATORY_CARE_PROVIDER_SITE_OTHER): Payer: 59 | Admitting: Cardiology

## 2011-10-25 ENCOUNTER — Encounter: Payer: Self-pay | Admitting: Cardiology

## 2011-10-25 VITALS — BP 120/74 | HR 88 | Ht 64.0 in | Wt 217.0 lb

## 2011-10-25 DIAGNOSIS — I2510 Atherosclerotic heart disease of native coronary artery without angina pectoris: Secondary | ICD-10-CM

## 2011-10-25 MED ORDER — METOPROLOL SUCCINATE ER 25 MG PO TB24: 25.0000 mg | ORAL_TABLET | Freq: Every day | ORAL | Status: DC

## 2011-10-25 NOTE — Patient Instructions (Signed)
Your physician recommends that you schedule a follow-up appointment in: 6 weeks with Dr Myrtis Ser Your physician has recommended you make the following change in your medication: RESTART Metoprolol 25 mg daily

## 2011-10-25 NOTE — Progress Notes (Signed)
HPI Patient is seen for followup coronary artery disease.  I saw her last on October 11, 2011.  At that time her Renexa dose was increased to 1000 mg b.i.d.  Since that time the patient has had increased angina.  She's had only a few occurrences.  They are random.  She did have some when she was moving some light boxes.  We have had her off of her beta blocker.  This was done for the possibility that spasm was playing a role.  Her resting heart rate is 88.  Allergies  Allergen Reactions  . Erythromycin   . Indomethacin   . Penicillins     Current Outpatient Prescriptions  Medication Sig Dispense Refill  . albuterol (PROVENTIL,VENTOLIN) 90 MCG/ACT inhaler Inhale 2 puffs into the lungs every 6 (six) hours as needed.        Marland Kitchen amLODipine (NORVASC) 2.5 MG tablet Take 2.5 mg by mouth daily.        Marland Kitchen aspirin 81 MG tablet Take 81 mg by mouth daily.        Marland Kitchen atorvastatin (LIPITOR) 20 MG tablet Take 1 tablet (20 mg total) by mouth daily.      . benazepril (LOTENSIN) 5 MG tablet Take 5 mg by mouth daily.        . calcium carbonate 200 MG capsule Take 250 mg by mouth daily.        . clopidogrel (PLAVIX) 75 MG tablet Take 75 mg by mouth daily.        . cyclobenzaprine (FLEXERIL) 10 MG tablet Take 10 mg by mouth 3 (three) times daily as needed.        . DULoxetine (CYMBALTA) 30 MG capsule 3 tabs po qd      . ergocalciferol (VITAMIN D2) 50000 UNITS capsule Take 50,000 Units by mouth once a week.        . isosorbide mononitrate (IMDUR) 30 MG 24 hr tablet Take 1 tablet (30 mg total) by mouth daily.  90 tablet  4  . metoprolol (TOPROL-XL) 50 MG 24 hr tablet Take 50 mg by mouth daily.        . Multiple Vitamin (MULTIVITAMIN) tablet Take 1 tablet by mouth daily.        . nitroGLYCERIN (NITROSTAT) 0.4 MG SL tablet Place 1 tablet (0.4 mg total) under the tongue every 5 (five) minutes as needed.  25 tablet  11  . pregabalin (LYRICA) 75 MG capsule 3 tabs morning and 3 tabs qhs      . ranolazine (RANEXA) 500 MG 12  hr tablet Take 2 tablets (1,000 mg total) by mouth 2 (two) times daily.  360 tablet  2  . traMADol (ULTRAM) 50 MG tablet Take 50 mg by mouth 3 (three) times daily as needed.       . vitamin C (ASCORBIC ACID) 500 MG tablet Take 500 mg by mouth daily.          History   Social History  . Marital Status: Married    Spouse Name: N/A    Number of Children: 2  . Years of Education: N/A   Occupational History  . 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 Smoking status: Never Smoker   . Smokeless tobacco: Not on file  . Alcohol Use: Yes     occasional  . Drug Use: No  . Sexually Active: Not on file   Other Topics Concern  . Not on file   Social History  Narrative  . No narrative on file    Family History  Problem Relation Age of Onset  . Other Mother     died pulmonary issues  . Coronary artery disease Father     died- CABG-HTN- AAA    Past Medical History  Diagnosis Date  . CAD (coronary artery disease)     DES to the LAD July 06, 2011  / Relook catheter July 14, 2011 stent patent, moderate mid LAD stenosis but not high grade , plan medical therapy  . Hypertension   . Hyperlipidemia   . Borderline diabetes   . Morbid obesity   . Fibromyalgia   . Osteoarthritis   . Degenerative joint disease   . H/O: hysterectomy   . Groin hematoma     July 06 2011 treated with pressure  . Hematoma     Radial catheter site July 13, 2011    Past Surgical History  Procedure Date  . Cardiac catheterization     revealing patency of the LAD stent with mild compromise of  the diagonal and moderate mid LAD stenosis, but does not appear to  be high grade.   . Cholecystectomy   . Tonsillectomy   . Carpal tunnel release     ROS  Patient denies fever, chills, headache, sweats, rash, change in vision, change in hearing, cough, nausea vomiting, urinary symptoms.  All other systems are reviewed and are negative. PHYSICAL EXAM Patient stable.  She is oriented to person  time and place.  Affect is normal.  There is no jugular venous distention.  Lungs are clear.  Respiratory effort is nonlabored.  Cardiac exam reveals S1-S2.  No clicks or significant murmurs.  The abdomen is soft.  There's no peripheral edema. Filed Vitals:   10/25/11 1145  BP: 120/74  Pulse: 88  Height: 5\' 4"  (1.626 m)  Weight: 217 lb (98.431 kg)    ASSESSMENT & PLAN

## 2011-10-25 NOTE — Assessment & Plan Note (Signed)
The patient is feeling somewhat better. Renexa At 1000 mg b.i.d. May be making a difference.  She's had only infrequent symptoms.  She seemed to have more exertional episode.  With this in mind and her resting heart rate of 88 chosen to start back 25 mg of metoprolol.  We'll see her back in 6 weeks.

## 2011-10-26 ENCOUNTER — Encounter (HOSPITAL_COMMUNITY): Payer: 59

## 2011-10-29 ENCOUNTER — Encounter (HOSPITAL_COMMUNITY): Payer: Self-pay | Admitting: Emergency Medicine

## 2011-10-29 ENCOUNTER — Encounter (HOSPITAL_COMMUNITY): Payer: Self-pay

## 2011-10-29 ENCOUNTER — Emergency Department (HOSPITAL_COMMUNITY)
Admission: EM | Admit: 2011-10-29 | Discharge: 2011-10-29 | Disposition: A | Discharge status: Home / Self Care | Payer: 59 | Attending: Emergency Medicine | Admitting: Emergency Medicine

## 2011-10-29 ENCOUNTER — Encounter (HOSPITAL_COMMUNITY): Payer: 59

## 2011-10-29 ENCOUNTER — Other Ambulatory Visit: Payer: Self-pay

## 2011-10-29 ENCOUNTER — Emergency Department (HOSPITAL_COMMUNITY): Payer: 59

## 2011-10-29 ENCOUNTER — Emergency Department (INDEPENDENT_AMBULATORY_CARE_PROVIDER_SITE_OTHER)
Admission: EM | Admit: 2011-10-29 | Discharge: 2011-10-29 | Disposition: A | Discharge status: Nursing Home (Includes ICF) | Payer: 59 | Source: Home / Self Care | Attending: Emergency Medicine | Admitting: Emergency Medicine

## 2011-10-29 DIAGNOSIS — R079 Chest pain, unspecified: Secondary | ICD-10-CM

## 2011-10-29 DIAGNOSIS — R42 Dizziness and giddiness: Secondary | ICD-10-CM | POA: Insufficient documentation

## 2011-10-29 DIAGNOSIS — E785 Hyperlipidemia, unspecified: Secondary | ICD-10-CM | POA: Insufficient documentation

## 2011-10-29 DIAGNOSIS — R5383 Other fatigue: Secondary | ICD-10-CM | POA: Insufficient documentation

## 2011-10-29 DIAGNOSIS — M1990 Unspecified osteoarthritis, unspecified site: Secondary | ICD-10-CM | POA: Insufficient documentation

## 2011-10-29 DIAGNOSIS — I10 Essential (primary) hypertension: Secondary | ICD-10-CM | POA: Insufficient documentation

## 2011-10-29 DIAGNOSIS — R5381 Other malaise: Secondary | ICD-10-CM | POA: Insufficient documentation

## 2011-10-29 DIAGNOSIS — I2510 Atherosclerotic heart disease of native coronary artery without angina pectoris: Secondary | ICD-10-CM | POA: Insufficient documentation

## 2011-10-29 DIAGNOSIS — Z7982 Long term (current) use of aspirin: Secondary | ICD-10-CM | POA: Insufficient documentation

## 2011-10-29 DIAGNOSIS — Z79899 Other long term (current) drug therapy: Secondary | ICD-10-CM | POA: Insufficient documentation

## 2011-10-29 LAB — COMPREHENSIVE METABOLIC PANEL
AST: 13 U/L (ref 0–37)
Albumin: 3.3 g/dL — ABNORMAL LOW (ref 3.5–5.2)
Calcium: 8.8 mg/dL (ref 8.4–10.5)
Creatinine, Ser: 0.82 mg/dL (ref 0.50–1.10)

## 2011-10-29 LAB — POCT I-STAT TROPONIN I

## 2011-10-29 LAB — CBC
HCT: 35.8 % — ABNORMAL LOW (ref 36.0–46.0)
MCHC: 32.7 g/dL (ref 30.0–36.0)
MCV: 86.7 fL (ref 78.0–100.0)
RDW: 14.9 % (ref 11.5–15.5)

## 2011-10-29 LAB — DIFFERENTIAL
Basophils Absolute: 0 10*3/uL (ref 0.0–0.1)
Basophils Relative: 1 % (ref 0–1)
Eosinophils Relative: 7 % — ABNORMAL HIGH (ref 0–5)
Monocytes Absolute: 0.4 10*3/uL (ref 0.1–1.0)
Neutro Abs: 2.5 10*3/uL (ref 1.7–7.7)

## 2011-10-29 MED ORDER — ASPIRIN 81 MG PO CHEW
324.0000 mg | CHEWABLE_TABLET | Freq: Once | ORAL | Status: AC
  Administered 2011-10-29: 324 mg via ORAL

## 2011-10-29 MED ORDER — ASPIRIN 81 MG PO CHEW
CHEWABLE_TABLET | ORAL | Status: AC
  Filled 2011-10-29: qty 4

## 2011-10-29 MED ORDER — NITROGLYCERIN 0.4 MG SL SUBL: 0.4000 mg | SUBLINGUAL_TABLET | SUBLINGUAL | Status: DC | PRN

## 2011-10-29 MED ORDER — SODIUM CHLORIDE 0.9 % IV BOLUS (SEPSIS)
1000.0000 mL | Freq: Once | INTRAVENOUS | Status: AC
  Administered 2011-10-29: 1000 mL via INTRAVENOUS

## 2011-10-29 MED ORDER — NITROGLYCERIN 0.4 MG SL SUBL
SUBLINGUAL_TABLET | SUBLINGUAL | Status: AC
  Filled 2011-10-29: qty 50

## 2011-10-29 NOTE — ED Provider Notes (Signed)
History     CSN: [id number] Arrival date & time: 10/29/2011  4:18 PM   First MD Initiated Contact with Patient 10/29/11 1620      Chief Complaint  Patient presents with  . Chest Pain    (Consider location/radiation/quality/duration/timing/severity/associated sxs/prior treatment) Patient is a 60 y.o. female presenting with chest pain. The history is provided by the patient (The patient states that she started having some chest pain when she was riding in the car while her son was driving she took one nitroglycerin this helped the chest pain and became weak and dizzy and fell to the floor but did not pass out patient has a his).  Chest Pain The chest pain began 3 - 5 hours ago. Chest pain occurs constantly. The chest pain is improving. At its most intense, the pain is at 2/10. The pain is currently at 1/10. The quality of the pain is described as dull. Chest pain is worsened by stress. Pertinent negatives for primary symptoms include no fever, no fatigue, no syncope, no cough and no abdominal pain.  Pertinent negatives for associated symptoms include no claudication.  Pertinent negatives for past medical history include no seizures.     Past Medical History  Diagnosis Date  . CAD (coronary artery disease)     DES to the LAD July 06, 2011  / Relook catheter July 14, 2011 stent patent, moderate mid LAD stenosis but not high grade , plan medical therapy.  Last cath Sept 2012  . Hypertension   . Hyperlipidemia   . Borderline diabetes   . Morbid obesity   . Fibromyalgia   . Osteoarthritis   . Degenerative joint disease   . Groin hematoma     July 06 2011 treated with pressure  . Hematoma     Radial catheter site July 13, 2011    Past Surgical History  Procedure Date  . Cardiac catheterization     revealing patency of the LAD stent with mild compromise of  the diagonal and moderate mid LAD stenosis, but does not appear to  be high grade.   . Cholecystectomy   . Tonsillectomy     . Carpal tunnel release   . Bunionectomy   . Knee arthroscopy   . Abdominal hysterectomy     Family History  Problem Relation Age of Onset  . Other Mother     died pulmonary issues  . Coronary artery disease Father 10    died- CABG-HTN- AAA    History  Substance Use Topics  . Smoking status: Never Smoker   . Smokeless tobacco: Never Used  . Alcohol Use: Yes     occasional    OB History    Grav Para Term Preterm Abortions TAB SAB Ect Mult Living                  Review of Systems  Constitutional: Negative for fever and fatigue.  HENT: Negative for congestion, sinus pressure and ear discharge.   Eyes: Negative for discharge.  Respiratory: Negative for cough.   Cardiovascular: Positive for chest pain. Negative for claudication and syncope.  Gastrointestinal: Negative for abdominal pain and diarrhea.  Genitourinary: Negative for frequency and hematuria.  Musculoskeletal: Negative for back pain.  Skin: Negative for rash.  Neurological: Negative for seizures and headaches.  Hematological: Negative.   Psychiatric/Behavioral: Negative for hallucinations.    Allergies  Erythromycin; Indomethacin; Biaxin; and Penicillins  Home Medications   Current Outpatient Rx  Name Route Sig Dispense Refill  .  AMLODIPINE BESYLATE 2.5 MG PO TABS Oral Take 2.5 mg by mouth daily.      . ASPIRIN 81 MG PO TABS Oral Take 81 mg by mouth daily.      . ATORVASTATIN CALCIUM 20 MG PO TABS Oral Take 1 tablet (20 mg total) by mouth daily.    Marland Kitchen BENAZEPRIL HCL 5 MG PO TABS Oral Take 5 mg by mouth daily.      Marland Kitchen CALCIUM CARBONATE 200 MG PO CAPS Oral Take 250 mg by mouth daily.      Marland Kitchen CLOPIDOGREL BISULFATE 75 MG PO TABS Oral Take 75 mg by mouth daily.     . CYCLOBENZAPRINE HCL 10 MG PO TABS Oral Take 10 mg by mouth 3 (three) times daily as needed. For back pain    . DULOXETINE HCL 30 MG PO CPEP Oral Take 90 mg by mouth daily.     . ERGOCALCIFEROL 50000 UNITS PO CAPS Oral Take 50,000 Units by mouth  once a week.      . ISOSORBIDE MONONITRATE ER 30 MG PO TB24 Oral Take 1 tablet (30 mg total) by mouth daily. 90 tablet 4  . METOPROLOL SUCCINATE 25 MG PO TB24 Oral Take 1 tablet (25 mg total) by mouth daily. 90 tablet 2  . THERA M PLUS PO TABS Oral Take 1 tablet by mouth daily.      Marland Kitchen NITROGLYCERIN 0.4 MG SL SUBL Sublingual Place 0.4 mg under the tongue every 5 (five) minutes as needed. For chest pain     . PREGABALIN 75 MG PO CAPS Oral Take 225 mg by mouth 2 (two) times daily. 3 tabs morning and 3 tabs qhs    . RANOLAZINE 500 MG PO TB12 Oral Take 2 tablets (1,000 mg total) by mouth 2 (two) times daily. 360 tablet 2  . TRAMADOL HCL 50 MG PO TABS Oral Take 50 mg by mouth 3 (three) times daily as needed. For pain    . VITAMIN C 500 MG PO TABS Oral Take 500 mg by mouth daily.      . ALBUTEROL 90 MCG/ACT IN AERS Inhalation Inhale 1-2 puffs into the lungs every 4 (four) hours as needed. For wheezing      BP 95/56  Pulse 71  Temp(Src) 98 F (36.7 C) (Oral)  Resp 16  SpO2 100%  Physical Exam  Constitutional: She is oriented to person, place, and time. She appears well-developed.  HENT:  Head: Normocephalic and atraumatic.  Eyes: Conjunctivae and EOM are normal. No scleral icterus.  Neck: Neck supple. No thyromegaly present.  Cardiovascular: Normal rate and regular rhythm.  Exam reveals no gallop and no friction rub.   No murmur heard. Pulmonary/Chest: No stridor. She has no wheezes. She has no rales. She exhibits no tenderness.  Abdominal: She exhibits no distension. There is no tenderness. There is no rebound.  Musculoskeletal: Normal range of motion. She exhibits no edema.  Lymphadenopathy:    She has no cervical adenopathy.  Neurological: She is oriented to person, place, and time. Coordination normal.  Skin: No rash noted. No erythema.  Psychiatric: She has a normal mood and affect. Her behavior is normal.    ED Course  Procedures (including critical care time)  Labs Reviewed    CBC - Abnormal; Notable for the following:    Hemoglobin 11.7 (*)    HCT 35.8 (*)    All other components within normal limits  DIFFERENTIAL - Abnormal; Notable for the following:    Eosinophils Relative 7 (*)  All other components within normal limits  COMPREHENSIVE METABOLIC PANEL - Abnormal; Notable for the following:    Glucose, Bld 105 (*)    Albumin 3.3 (*)    GFR calc non Af Amer 76 (*)    GFR calc Af Amer 88 (*)    All other components within normal limits  POCT I-STAT TROPONIN I  I-STAT TROPONIN I   Dg Chest 2 View  10/29/2011  *RADIOLOGY REPORT*  Clinical Data: Chest pain  CHEST - 2 VIEW  Comparison: 07/12/2011  Findings: Mild cardiomegaly.  Pulmonary vascularity is within normal limits.  Mild hyperaeration and interstitial prominence. Minimal scarring verse atelectasis at the left base.  No pneumothorax.  IMPRESSION: Cardiomegaly without decompensation.  Original Report Authenticated By: Donavan Burnet, M.D.     1. Chest pain    Patient was seen by cardiology and it was felt the patient was safe to go home followup with her cardiologist   MDM  Chest pain angina     Date: 10/29/2011  Rate:67  Rhythm: normal sinus rhythm  QRS Axis: normal  Intervals: normal  ST/T Wave abnormalities: normal  Conduction Disutrbances:none  Narrative Interpretation:   Old EKG Reviewed: none available      Benny Lennert, MD 10/29/11 [phone]

## 2011-10-29 NOTE — ED Notes (Signed)
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  Began having chest pressure and took 1 NTG sl about 2:40-2:45p. States she began to feel lightheaded and having some tunnel vision.  Pt sat down on the floor.  Bp checked and it was 115/60.  Taken to room 2 and placed on stretcher.  Pt states still having slight pressure in chest.  Dr. Chaney Malling in room with pt.  Placed on cardiac monitor and VS taken.  States she had to take NTG yesterday while test driving a car with her son.  States usually has the chest pressure if she gets stressed or angry.

## 2011-10-29 NOTE — ED Provider Notes (Signed)
History     CSN: [id number] Arrival date & time: 10/29/2011  2:59 PM   First MD Initiated Contact with Patient 10/29/11 1500      Chief Complaint  Patient presents with  . Chest Pain    HPI Comments: Pt with h/o CAD, angina, recent cath with 90% occlusion LAD s/p stent and another unstentable lesion in branch from LAD with intermittent episodes of left sided nonradiating chest pressure lasting approx 5-10 min starting yesterday. Episode yesterday precipitated by stress. States sx identical to previous episodes of angina. Episodes last 5-10 minutes and usually improve with nitro. Yesterday had episode while under stress, today happened while at rest. Took nitro but became presyncopal. no improvement in chest pressure after nitro. Of note, pt recently restarted on metoprolol. No N/V, diaphoresis, palpitations, abd pain, LE edema.   Patient is a 60 y.o. female presenting with chest pain. The history is provided by the patient.  Chest Pain The chest pain began less than 1 hour ago. Duration of episode(s) is 10 minutes. Chest pain occurs intermittently. The chest pain is unchanged. The severity of the pain is moderate. The quality of the pain is described as similar to previous episodes and pressure-like. The pain does not radiate. Primary symptoms include dizziness. Pertinent negatives for primary symptoms include no fever, no shortness of breath, no cough, no palpitations, no abdominal pain, no nausea and no vomiting.  Dizziness does not occur with nausea or vomiting. She tried nitroglycerin for the symptoms.  Her past medical history is significant for CAD and hyperlipidemia.  Procedure history is positive for cardiac catheterization and echocardiogram.     Past Medical History  Diagnosis Date  . CAD (coronary artery disease)     DES to the LAD July 06, 2011  / Relook catheter July 14, 2011 stent patent, moderate mid LAD stenosis but not high grade , plan medical therapy  . Hypertension     . Hyperlipidemia   . Borderline diabetes   . Morbid obesity   . Fibromyalgia   . Osteoarthritis   . Degenerative joint disease   . H/O: hysterectomy   . Groin hematoma     July 06 2011 treated with pressure  . Hematoma     Radial catheter site July 13, 2011    Past Surgical History  Procedure Date  . Cardiac catheterization     revealing patency of the LAD stent with mild compromise of  the diagonal and moderate mid LAD stenosis, but does not appear to  be high grade.   . Cholecystectomy   . Tonsillectomy   . Carpal tunnel release   . Bunionectomy   . Knee arthroscopy     Family History  Problem Relation Age of Onset  . Other Mother     died pulmonary issues  . Coronary artery disease Father     died- CABG-HTN- AAA    History  Substance Use Topics  . Smoking status: Never Smoker   . Smokeless tobacco: Not on file  . Alcohol Use: Yes     occasional    OB History    Grav Para Term Preterm Abortions TAB SAB Ect Mult Living                  Review of Systems  Constitutional: Negative for fever.  Respiratory: Negative for cough and shortness of breath.   Cardiovascular: Positive for chest pain. Negative for palpitations and leg swelling.  Gastrointestinal: Negative for nausea, vomiting and abdominal pain.  Skin: Positive for pallor.  Neurological: Positive for dizziness and light-headedness. Negative for syncope.    Allergies  Biaxin; Erythromycin; Indomethacin; and Penicillins  Home Medications   Current Outpatient Rx  Name Route Sig Dispense Refill  . ALBUTEROL 90 MCG/ACT IN AERS Inhalation Inhale 2 puffs into the lungs every 6 (six) hours as needed.      Marland Kitchen AMLODIPINE BESYLATE 2.5 MG PO TABS Oral Take 2.5 mg by mouth daily.      . ASPIRIN 81 MG PO TABS Oral Take 81 mg by mouth daily.      . ATORVASTATIN CALCIUM 20 MG PO TABS Oral Take 1 tablet (20 mg total) by mouth daily.    Marland Kitchen BENAZEPRIL HCL 5 MG PO TABS Oral Take 5 mg by mouth daily.      Marland Kitchen CALCIUM  CARBONATE 200 MG PO CAPS Oral Take 250 mg by mouth daily.      Marland Kitchen CLOPIDOGREL BISULFATE 75 MG PO TABS Oral Take 75 mg by mouth daily.      . CYCLOBENZAPRINE HCL 10 MG PO TABS Oral Take 10 mg by mouth 3 (three) times daily as needed.      . DULOXETINE HCL 30 MG PO CPEP  3 tabs po qd    . ERGOCALCIFEROL 50000 UNITS PO CAPS Oral Take 50,000 Units by mouth once a week.      . ISOSORBIDE MONONITRATE ER 30 MG PO TB24 Oral Take 1 tablet (30 mg total) by mouth daily. 90 tablet 4  . METOPROLOL SUCCINATE 25 MG PO TB24 Oral Take 1 tablet (25 mg total) by mouth daily. 90 tablet 2  . ONE-DAILY MULTI VITAMINS PO TABS Oral Take 1 tablet by mouth daily.      Marland Kitchen NITROGLYCERIN 0.4 MG SL SUBL Sublingual Place 1 tablet (0.4 mg total) under the tongue every 5 (five) minutes as needed. 25 tablet 11  . PREGABALIN 75 MG PO CAPS  3 tabs morning and 3 tabs qhs    . RANOLAZINE 500 MG PO TB12 Oral Take 2 tablets (1,000 mg total) by mouth 2 (two) times daily. 360 tablet 2  . TRAMADOL HCL 50 MG PO TABS Oral Take 50 mg by mouth 3 (three) times daily as needed.     Marland Kitchen VITAMIN C 500 MG PO TABS Oral Take 500 mg by mouth daily.        There were no vitals taken for this visit.  Physical Exam  Nursing note and vitals reviewed. Constitutional: She is oriented to person, place, and time. She appears well-developed and well-nourished.  HENT:  Head: Normocephalic and atraumatic.  Eyes: Conjunctivae and EOM are normal. Pupils are equal, round, and reactive to light.  Neck: Normal range of motion.  Cardiovascular: Normal rate, regular rhythm, normal heart sounds and intact distal pulses.   No murmur heard. Pulmonary/Chest: Effort normal and breath sounds normal. No respiratory distress. She has no wheezes. She has no rales. She exhibits no tenderness.  Abdominal: Soft. Bowel sounds are normal. She exhibits no distension. There is no tenderness.  Musculoskeletal: Normal range of motion. She exhibits no edema and no tenderness.    Neurological: She is alert and oriented to person, place, and time.  Skin: Skin is warm and dry.  Psychiatric: She has a normal mood and affect. Her behavior is normal. Judgment and thought content normal.    ED Course  Procedures (including critical care time)  Labs Reviewed - No data to display No results found.   1. Chest  pain     Date: 10/29/2011  Rate: 82  Rhythm: normal sinus rhythm  QRS Axis: left  Intervals: normal  ST/T Wave abnormalities: nonspecific ST changes  Conduction Disutrbances:right bundle branch block  Narrative Interpretation:   Old EKG Reviewed: unchanged TWI in V1 only, compared to TWI in V1, V2 in prev EKG.  MDM   pt at high risk for recurrent ACS. No new EKG changes compared to last one. Gave asa, started IVF. Gave 500 cc ns bolus BP 110/68. Transferring to ED.    Luiz Blare, MD 10/29/11 [id number]

## 2011-10-29 NOTE — ED Notes (Signed)
Pt states that on Sunday she had onset of substernal chest pain that was described as tightness. She took 1 sl nitro on Sunday and the pain was relieved. She had onset of substernal chest tightness again today and took 1 sl nitro at work and the pain was relieved but pt felt like she was going to pass out. [facility] states that she had a cath this year with placement of lad stent 07/13/11. Pt states that the pain was different today, no sob, no diaphoresis, no n/v/d associated with onset of chest pain.

## 2011-10-29 NOTE — Consult Note (Signed)
CARDIOLOGY ADMISSION NOTE  Patient ID: Courtney Steele MRN: [id number] DOB/AGE: 60-18-1952 60 y.o.  Admit date: 10/29/2011 Primary Physician Astrid Divine, MD, MD Primary Cardiologist Dr. Myrtis Ser Chief Complaint   Chest pain  HPI: The patient presents after an episode of presyncope at work. She has an extensive history of coronary disease as described below. She has a stable angina pattern. She says she takes about 2 or 3 nitroglycerin per week. She typically gets pain when she is stressed. She had some increased stress yesterday driving with her son. She had to take a couple of nitroglycerin which is unusual. 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 This was not unusual. It is moderate. It is substernal. It is similar to previous angina. She doesn't get associated symptoms but does get radiation to her arms both elbows and her back. She did take 1 nitroglycerin. Her pain EEGs. However, she had an episode of dizziness and lightheadedness and had to sit on the floor. She did not lose consciousness. She recently has otherwise done well. She has had no new shortness of breath, PND or orthopnea. She's otherwise not describing orthostatic symptoms. She's not been having any palpitations. She's been able to be active including vacuuming. She has been working as per usual.  Past Medical History  Diagnosis Date  . CAD (coronary artery disease)     DES to the LAD July 06, 2011  / Relook catheter July 14, 2011 stent patent, moderate mid LAD stenosis but not high grade , plan medical therapy  . Hypertension   . Hyperlipidemia   . Borderline diabetes   . Morbid obesity   . Fibromyalgia   . Osteoarthritis   . Degenerative joint disease   . H/O: hysterectomy   . Groin hematoma     July 06 2011 treated with pressure  . Hematoma     Radial catheter site July 13, 2011    Past Surgical History  Procedure Date  . Cardiac catheterization     revealing patency of the LAD  stent with mild compromise of  the diagonal and moderate mid LAD stenosis, but does not appear to  be high grade.   . Cholecystectomy   . Tonsillectomy   . Carpal tunnel release   . Bunionectomy   . Knee arthroscopy      Allergies  Allergen Reactions  . Erythromycin Diarrhea  . Indomethacin Diarrhea  . Biaxin (Clarithromycin Er) Rash  . Penicillins Rash   Current Facility-Administered Medications on File Prior to Encounter  Medication Dose Route Frequency Provider Last Rate Last Dose  . aspirin chewable tablet 324 mg  324 mg Oral Once Luiz Blare, MD   324 mg at 10/29/11 1520  . sodium chloride 0.9 % bolus 1,000 mL  1,000 mL Intravenous Once Luiz Blare, MD   1,000 mL at 10/29/11 1537  . DISCONTD: nitroGLYCERIN (NITROSTAT) SL tablet 0.4 mg  0.4 mg Sublingual Q5 Min x 3 PRN Luiz Blare, MD       Current Outpatient Prescriptions on File Prior to Encounter  Medication Sig Dispense Refill  . amLODipine (NORVASC) 2.5 MG tablet Take 2.5 mg by mouth daily.        Marland Kitchen aspirin 81 MG tablet Take 81 mg by mouth daily.        Marland Kitchen atorvastatin (LIPITOR) 20 MG tablet Take 1 tablet (20 mg total) by mouth daily.      . benazepril (LOTENSIN) 5 MG tablet  Take 5 mg by mouth daily.        . calcium carbonate 200 MG capsule Take 250 mg by mouth daily.        . clopidogrel (PLAVIX) 75 MG tablet Take 75 mg by mouth daily.       . cyclobenzaprine (FLEXERIL) 10 MG tablet Take 10 mg by mouth 3 (three) times daily as needed. For back pain      . DULoxetine (CYMBALTA) 30 MG capsule Take 90 mg by mouth daily.       . ergocalciferol (VITAMIN D2) 50000 UNITS capsule Take 50,000 Units by mouth once a week.        . isosorbide mononitrate (IMDUR) 30 MG 24 hr tablet Take 1 tablet (30 mg total) by mouth daily.  90 tablet  4  . metoprolol (TOPROL-XL) 25 MG 24 hr tablet Take 1 tablet (25 mg total) by mouth daily.  90 tablet  2  . pregabalin (LYRICA) 75 MG capsule Take 225 mg by mouth 2 (two) times  daily. 3 tabs morning and 3 tabs qhs      . ranolazine (RANEXA) 500 MG 12 hr tablet Take 2 tablets (1,000 mg total) by mouth 2 (two) times daily.  360 tablet  2  . traMADol (ULTRAM) 50 MG tablet Take 50 mg by mouth 3 (three) times daily as needed. For pain      . vitamin C (ASCORBIC ACID) 500 MG tablet Take 500 mg by mouth daily.        Marland Kitchen albuterol (PROVENTIL,VENTOLIN) 90 MCG/ACT inhaler Inhale 1-2 puffs into the lungs every 4 (four) hours as needed. For wheezing        History   Social History  . Marital Status: Married    Spouse Name: N/A    Number of Children: 2  . Years of Education: N/A   Occupational History  . URGENT C[employer]   Social History Main Topics  . Smoking status: Never Smoker   . Smokeless tobacco: Never Used  . Alcohol Use: Yes     occasional  . Drug Use: No  . Sexually Active: Not on file   Other Topics Concern  . Not on file   Social History Narrative  . No narrative on file    Family History  Problem Relation Age of Onset  . Other Mother     died pulmonary issues  . Coronary artery disease Father     died- CABG-HTN- AAA    Physical Exam: Blood pressure 106/47, pulse 69, temperature 98 F (36.7 C), temperature source Oral, resp. rate 16, SpO2 100.00%.  GENERAL:  Well appearing HEENT:  Pupils equal round and reactive, fundi not visualized, oral mucosa unremarkable NECK:  No jugular venous distention, waveform within normal limits, carotid upstroke brisk and symmetric, no bruits, no thyromegaly LYMPHATICS:  No cervical, inguinal adenopathy LUNGS:  Clear to auscultation bilaterally BACK:  No CVA tenderness CHEST:  Unremarkable HEART:  PMI not displaced or sustained,S1 and S2 within normal limits, no S3, no S4, no clicks, no rubs, no murmurs ABD:  Flat, positive bowel sounds normal in frequency in pitch, no bruits, no rebound, no guarding, no midline pulsatile mass, no hepatomegaly, no splenomegaly EXT:  2 plus pulses throughout, no edema,  no cyanosis no clubbing SKIN:  No rashes no nodules NEURO:  Cranial nerves II through XII grossly intact, motor grossly intact throughout PSYCH:  Cognitively intact, oriented to person place and time   Labs: Lab Results  Component  Value Date   BUN 18 08/27/2011   Lab Results  Component Value Date   CREATININE 0.7 08/27/2011   Lab Results  Component Value Date   NA 141 08/27/2011   K 3.9 08/27/2011   CL 106 08/27/2011   CO2 29 08/27/2011   Lab Results  Component Value Date   CKTOTAL 44 07/13/2011   CKMB 1.9 07/13/2011   TROPONINI <0.30 07/13/2011   Lab Results  Component Value Date   WBC 5.0 10/29/2011   HGB 11.7* 10/29/2011   HCT 35.8* 10/29/2011   MCV 86.7 10/29/2011   PLT 181 10/29/2011   Lab Results  Component Value Date   CHOL 146 07/06/2011   HDL 41 07/06/2011   LDLCALC 68 07/06/2011   TRIG 184* 07/06/2011   CHOLHDL 3.6 07/06/2011   Lab Results  Component Value Date   ALT 21 07/06/2011   AST 21 07/06/2011   ALKPHOS 54 07/06/2011   BILITOT 0.8 07/06/2011    Cath August 29, 2011 Angiography demonstrates stable appearance of the  patient's coronary anatomy. Her left main and left circumflex are  widely patent. The LAD has a widely patent stent and beyond the stent  in the mid LAD there was 40-50% irregular stenosis, but further down in  the mid and distal LAD there was no significant stenosis noted. The  first diagonal branch originating from the stent is a small vessel that  has 50-70% ostial stenosis.     Radiology:  Cardiomegaly without decompensation.   EKG:  NSR, rate 82, incomplete RBBB  ASSESSMENT AND PLAN:   Presyncope:  The patient had an episode of presyncope at work. There is no objective evidence of ischemia. I would check orthostatics. She was recently restarted on a very low dose of beta blocker and I think it's reasonable for her to continue this. However, if symptoms recur she could discontinue this.  CAD:  She has had no change in her anginal  pattern. She will continue the meds as listed.  Dyslipidemia:  She has an excellent lipid profile although her triglycerides were recently mildly elevated. No change in therapy is indicated.  HTN:  Continue current names.  SignedRollene Rotunda 10/29/2011, 6:04 PM

## 2011-10-29 NOTE — ED Notes (Signed)
[facility] here to transport pt to the ED.  Report given to Tresa Endo, Charity fundraiser for [employer].

## 2011-10-31 ENCOUNTER — Encounter (HOSPITAL_COMMUNITY): Payer: 59

## 2011-11-02 ENCOUNTER — Encounter (HOSPITAL_COMMUNITY): Payer: 59

## 2011-11-05 ENCOUNTER — Encounter (HOSPITAL_COMMUNITY): Payer: 59

## 2011-11-06 ENCOUNTER — Encounter (HOSPITAL_COMMUNITY): Payer: Self-pay | Admitting: Cardiac Rehabilitation

## 2011-11-06 NOTE — Progress Notes (Deleted)
Subjective:      Patient ID: Courtney Steele is a 60 y.o. female.  Chief Complaint: HPI {Common ambulatory SmartLinks:19316} ROS    Objective:    Physical Exam  Lab Review:  {Recent labs:19471::"not applicable"}    Assessment:     No diagnosis found.   Plan:     ***

## 2011-11-06 NOTE — Progress Notes (Signed)
PC to pt to assess ability to return to Cardiac Rehab.  Pt wishes to withdraw from Cardiac Rehab as she is not able to coordinate rehab with her work schedule.

## 2011-11-07 ENCOUNTER — Encounter (HOSPITAL_COMMUNITY): Payer: 59

## 2011-11-09 ENCOUNTER — Encounter (HOSPITAL_COMMUNITY): Payer: 59

## 2011-11-09 NOTE — Progress Notes (Signed)
Cardiac Rehabilitation Program Progress Report   Orientation:  07/10/2011 Graduate Date:   Discharge Date:  11/08/2011  # of sessions completed: 7  Cardiologist: Adline Potter MD:  Everrett Coombe Time:  0815  A.  Exercise Program:  Tolerates exercise @ 2.2 METS for 30 minutes, Exercise limited by musculoskeletal problems, Poor attendance due to illness, Needs encouragement on exercise program and Discharged to home exercise program.  Anticipated compliance:  fair  B.  Mental Health:  Health related anxiety  C.  Education/Instruction/Skills  Uses Perceived Exertion Scale and/or Dyspnea Scale and Attended 2 education classes    D.  Nutrition/Weight Control/Body Composition:  CBG self-monitoring and Glycemic Control:  fair   *This section completed by Mickle Plumb, Andres Shad, RD, LDN, CDE  E.  Blood Lipids    Lab Results  Component Value Date   CHOL 146 07/06/2011     Lab Results  Component Value Date   TRIG 184* 07/06/2011     Lab Results  Component Value Date   HDL 41 07/06/2011     Lab Results  Component Value Date   CHOLHDL 3.6 07/06/2011     No results found for this basename: LDLDIRECT      F.  Lifestyle Changes: n/a  G.  Symptoms noted with exercise:  Angina, Musculoskeletal pain, MD notified  Report Completed By:  Dayton Martes   Comments:  Pt completed seven sessions. Pt discharged due to chronic angina and poor attendance. Pt will walk at home for thirty minutes most of the days week for exercise.

## 2011-11-12 ENCOUNTER — Ambulatory Visit (HOSPITAL_COMMUNITY): Payer: 59

## 2011-11-14 ENCOUNTER — Ambulatory Visit (HOSPITAL_COMMUNITY): Payer: 59

## 2011-11-16 ENCOUNTER — Ambulatory Visit (HOSPITAL_COMMUNITY): Payer: 59

## 2011-11-29 ENCOUNTER — Ambulatory Visit (INDEPENDENT_AMBULATORY_CARE_PROVIDER_SITE_OTHER): Payer: 59 | Admitting: Cardiology

## 2011-11-29 ENCOUNTER — Encounter: Payer: Self-pay | Admitting: Cardiology

## 2011-11-29 DIAGNOSIS — I10 Essential (primary) hypertension: Secondary | ICD-10-CM

## 2011-11-29 DIAGNOSIS — I2510 Atherosclerotic heart disease of native coronary artery without angina pectoris: Secondary | ICD-10-CM

## 2011-11-29 NOTE — Assessment & Plan Note (Signed)
Patient is doing well on her current medications which include Ranexa and the restart of metoprolol 25. No change in therapy.

## 2011-11-29 NOTE — Patient Instructions (Signed)
Your physician recommends that you schedule a follow-up appointment in: 3 months.  

## 2011-11-29 NOTE — Assessment & Plan Note (Signed)
Blood pressure stable. No change in therapy. 

## 2011-11-29 NOTE — Progress Notes (Signed)
HPI  Patient is seen in followup coronary artery disease. I saw her last October 25, 2011. She had a spell on November 19 with some slight chest pain. She took a nitroglycerin and felt presyncopal. She sat down with stable but her workers immediately put her on a stretcher put in an IV and took her to the emergency room. She was seen there by our team and she was stable and was allowed to go home. She has not taken any nitroglycerin since that day and has not had any significant pain. This is clearly an improvement for her. It is of note that the last change made in her medicine was done on November 15 including the resumption of a very small dose of beta blocker. She continues on Ranexa  Allergies  Allergen Reactions  . Erythromycin Diarrhea  . Indomethacin Diarrhea  . Biaxin (Clarithromycin Er) Rash  . Penicillins Rash    Current Outpatient Prescriptions  Medication Sig Dispense Refill  . amLODipine (NORVASC) 2.5 MG tablet Take 2.5 mg by mouth daily.        Marland Kitchen aspirin 81 MG tablet Take 81 mg by mouth daily.        Marland Kitchen atorvastatin (LIPITOR) 20 MG tablet Take 1 tablet (20 mg total) by mouth daily.      . benazepril (LOTENSIN) 5 MG tablet Take 5 mg by mouth daily.        . calcium carbonate 200 MG capsule Take 250 mg by mouth daily.        . clopidogrel (PLAVIX) 75 MG tablet Take 75 mg by mouth daily.       . cyclobenzaprine (FLEXERIL) 10 MG tablet Take 10 mg by mouth 3 (three) times daily as needed. For back pain      . DULoxetine (CYMBALTA) 30 MG capsule Take 90 mg by mouth daily.       . ergocalciferol (VITAMIN D2) 50000 UNITS capsule Take 50,000 Units by mouth once a week.        . isosorbide mononitrate (IMDUR) 30 MG 24 hr tablet Take 15 mg by mouth daily.        . Multiple Vitamins-Minerals (MULTIVITAMINS THER. W/MINERALS) TABS Take 1 tablet by mouth daily.        . nitroGLYCERIN (NITROSTAT) 0.4 MG SL tablet Place 0.4 mg under the tongue every 5 (five) minutes as needed. For chest pain        . pregabalin (LYRICA) 75 MG capsule Take 225 mg by mouth 2 (two) times daily. 3 tabs morning and 3 tabs qhs      . ranolazine (RANEXA) 500 MG 12 hr tablet Take 2 tablets (1,000 mg total) by mouth 2 (two) times daily.  360 tablet  2  . traMADol (ULTRAM) 50 MG tablet Take 50 mg by mouth 3 (three) times daily as needed. For pain      . vitamin C (ASCORBIC ACID) 500 MG tablet Take 500 mg by mouth as needed.       Marland Kitchen albuterol (PROVENTIL,VENTOLIN) 90 MCG/ACT inhaler Inhale 1-2 puffs into the lungs every 4 (four) hours as needed. For wheezing      . metoprolol (TOPROL-XL) 25 MG 24 hr tablet Take 1 tablet (25 mg total) by mouth daily.  90 tablet  2    History   Social History  . Marital Status: Married    Spouse Name: N/A    Number of Children: 2  . Years of Education: N/A   Occupational History  .  [employer]   Social History Main Topics  . Smoking status: Never Smoker   . Smokeless tobacco: Never Used  . Alcohol Use: Yes     occasional  . Drug Use: No  . Sexually Active: Not on file   Other Topics Concern  . Not on file   Social History Narrative  . No narrative on file    Family History  Problem Relation Age of Onset  . Other Mother     died pulmonary issues  . Coronary artery disease Father 58    died- CABG-HTN- AAA    Past Medical History  Diagnosis Date  . CAD (coronary artery disease)     DES to the LAD July 06, 2011  / Relook catheter July 14, 2011 stent patent, moderate mid LAD stenosis but not high grade , plan medical therapy.  Last cath Sept 2012  . Hypertension   . Hyperlipidemia   . Borderline diabetes   . Morbid obesity   . Fibromyalgia   . Osteoarthritis   . Degenerative joint disease   . Groin hematoma     July 06 2011 treated with pressure  . Hematoma     Radial catheter site July 13, 2011    Past Surgical History  Procedure Date  . Cardiac catheterization     revealing patency of the LAD stent with mild compromise of  the  diagonal and moderate mid LAD stenosis, but does not appear to  be high grade.   . Cholecystectomy   . Tonsillectomy   . Carpal tunnel release   . Bunionectomy   . Knee arthroscopy   . Abdominal hysterectomy     ROS   Patient denies fever, chills, headache, sweats, rash, change in vision, change in hearing,  Cough, nausea vomiting, urinary symptoms. All of the systems are reviewed and are negative.  PHYSICAL EXAM  She stable today. There is no jugulovenous distention. Lungs are clear. Respiratory effort is nonlabored. Cardiac exam reveals S1 and S2. There no clicks or significant murmurs. Abdomen is soft. There is no peripheral edema.  Filed Vitals:   11/29/11 1450  BP: 96/58  Pulse: 84  Resp: 12  Height: 5\' 4"  (1.626 m)  Weight: 218 lb (98.884 kg)    EKG  ASSESSMENT & PLAN

## 2012-02-04 ENCOUNTER — Other Ambulatory Visit: Payer: Self-pay | Admitting: *Deleted

## 2012-02-04 MED ORDER — AMLODIPINE BESYLATE 2.5 MG PO TABS: 2.5000 mg | ORAL_TABLET | Freq: Every day | ORAL | Status: DC

## 2012-02-07 ENCOUNTER — Encounter (HOSPITAL_COMMUNITY): Payer: Self-pay | Admitting: Emergency Medicine

## 2012-02-07 ENCOUNTER — Emergency Department (HOSPITAL_COMMUNITY)
Admission: EM | Admit: 2012-02-07 | Discharge: 2012-02-07 | Disposition: A | Discharge status: Home / Self Care | Payer: 59 | Attending: Emergency Medicine | Admitting: Emergency Medicine

## 2012-02-07 ENCOUNTER — Emergency Department (HOSPITAL_COMMUNITY): Payer: 59

## 2012-02-07 DIAGNOSIS — I2510 Atherosclerotic heart disease of native coronary artery without angina pectoris: Secondary | ICD-10-CM | POA: Insufficient documentation

## 2012-02-07 DIAGNOSIS — K5732 Diverticulitis of large intestine without perforation or abscess without bleeding: Secondary | ICD-10-CM | POA: Insufficient documentation

## 2012-02-07 DIAGNOSIS — E785 Hyperlipidemia, unspecified: Secondary | ICD-10-CM | POA: Insufficient documentation

## 2012-02-07 DIAGNOSIS — I10 Essential (primary) hypertension: Secondary | ICD-10-CM | POA: Insufficient documentation

## 2012-02-07 DIAGNOSIS — R110 Nausea: Secondary | ICD-10-CM | POA: Insufficient documentation

## 2012-02-07 DIAGNOSIS — K5792 Diverticulitis of intestine, part unspecified, without perforation or abscess without bleeding: Secondary | ICD-10-CM

## 2012-02-07 DIAGNOSIS — R1032 Left lower quadrant pain: Secondary | ICD-10-CM | POA: Insufficient documentation

## 2012-02-07 LAB — COMPREHENSIVE METABOLIC PANEL
ALT: 9 U/L (ref 0–35)
AST: 12 U/L (ref 0–37)
Albumin: 3.3 g/dL — ABNORMAL LOW (ref 3.5–5.2)
Alkaline Phosphatase: 56 U/L (ref 39–117)
BUN: 14 mg/dL (ref 6–23)
Chloride: 105 mEq/L (ref 96–112)
Potassium: 3.6 mEq/L (ref 3.5–5.1)
Sodium: 141 mEq/L (ref 135–145)
Total Bilirubin: 1 mg/dL (ref 0.3–1.2)

## 2012-02-07 LAB — URINALYSIS, ROUTINE W REFLEX MICROSCOPIC
Leukocytes, UA: NEGATIVE
Nitrite: NEGATIVE
Specific Gravity, Urine: 1.014 (ref 1.005–1.030)
pH: 6 (ref 5.0–8.0)

## 2012-02-07 LAB — CBC
Hemoglobin: 11.9 g/dL — ABNORMAL LOW (ref 12.0–15.0)
MCHC: 33.8 g/dL (ref 30.0–36.0)
RBC: 4.03 MIL/uL (ref 3.87–5.11)

## 2012-02-07 LAB — DIFFERENTIAL
Basophils Relative: 0 % (ref 0–1)
Monocytes Relative: 8 % (ref 3–12)
Neutro Abs: 2.7 10*3/uL (ref 1.7–7.7)
Neutrophils Relative %: 52 % (ref 43–77)

## 2012-02-07 LAB — LIPASE, BLOOD: Lipase: 22 U/L (ref 11–59)

## 2012-02-07 MED ORDER — METRONIDAZOLE 500 MG PO TABS: 500.0000 mg | ORAL_TABLET | Freq: Two times a day (BID) | ORAL | Status: AC

## 2012-02-07 MED ORDER — CIPROFLOXACIN HCL 500 MG PO TABS: 500.0000 mg | ORAL_TABLET | Freq: Two times a day (BID) | ORAL | Status: AC

## 2012-02-07 MED ORDER — IOHEXOL 300 MG/ML  SOLN: 20.0000 mL | INTRAMUSCULAR | Status: AC

## 2012-02-07 MED ORDER — IOHEXOL 300 MG/ML  SOLN
100.0000 mL | Freq: Once | INTRAMUSCULAR | Status: AC | PRN
  Administered 2012-02-07: 100 mL via INTRAVENOUS

## 2012-02-07 MED ORDER — ONDANSETRON HCL 4 MG/2ML IJ SOLN
4.0000 mg | Freq: Once | INTRAMUSCULAR | Status: AC
  Administered 2012-02-07: 4 mg via INTRAVENOUS
  Filled 2012-02-07: qty 2

## 2012-02-07 MED ORDER — METRONIDAZOLE 500 MG PO TABS: 500.0000 mg | ORAL_TABLET | Freq: Two times a day (BID) | ORAL | Status: DC

## 2012-02-07 MED ORDER — MORPHINE SULFATE 4 MG/ML IJ SOLN
4.0000 mg | Freq: Once | INTRAMUSCULAR | Status: AC
  Administered 2012-02-07: 4 mg via INTRAVENOUS
  Filled 2012-02-07: qty 1

## 2012-02-07 MED ORDER — PERCOCET 5-325 MG PO TABS: 1.0000 | ORAL_TABLET | Freq: Four times a day (QID) | ORAL | Status: AC | PRN

## 2012-02-07 MED ORDER — ONDANSETRON HCL 4 MG PO TABS: 4.0000 mg | ORAL_TABLET | Freq: Four times a day (QID) | ORAL | Status: DC

## 2012-02-07 MED ORDER — CIPROFLOXACIN HCL 500 MG PO TABS: 500.0000 mg | ORAL_TABLET | Freq: Two times a day (BID) | ORAL | Status: DC

## 2012-02-07 MED ORDER — SODIUM CHLORIDE 0.9 % IV BOLUS (SEPSIS)
1000.0000 mL | Freq: Once | INTRAVENOUS | Status: AC
  Administered 2012-02-07: 1000 mL via INTRAVENOUS

## 2012-02-07 MED ORDER — ONDANSETRON HCL 4 MG PO TABS: 4.0000 mg | ORAL_TABLET | Freq: Four times a day (QID) | ORAL | Status: AC

## 2012-02-07 NOTE — ED Provider Notes (Signed)
6:14 PM Patient is in CDU holding for CT abd/pelvis.  Pt with continued pain throughout lower abdomen, worse in LLQ.  Pain medicine is ordered.  On exam, pt is A&Ox4, NAD, RRR, CTAB, abd soft, TTP suprapubic, LLQ, LUQ, no guarding, no rebound.  Will continue to follow.    8:03 PM Patient with diverticulitis by CT.  Pt is tolerating PO, agrees with discharge home.  Pain and nausea controlled.  Pt d/c home with abx, pain and nausea medication, PCP follow up.  Patient verbalizes understanding and agrees with plan.    Rise Patience, [place] 02/07/12 2004

## 2012-02-07 NOTE — ED Provider Notes (Signed)
Medical screening examination/treatment/procedure(s) were conducted as a shared visit with non-physician practitioner(s) and myself.  I personally evaluated the patient during the encounter   Glynn Octave, MD 02/07/12 2131

## 2012-02-07 NOTE — Discharge Instructions (Signed)
Please follow up with your primary care provider.  Follow a liquid diet for the next 2-3 days to allow the bowels time to rest, then slowly increase to your normal diet.  Return to the ER immediately if you develop increased pain, fevers greater than 100.4, uncontrolled nausea and vomiting, inability to tolerate fluids by mouth.  You may return to the ER at any time for worsening condition or any new symptoms that concern you.    Diverticulitis A diverticulum is a small pouch or sac on the colon. Diverticulosis is the presence of these diverticula on the colon. Diverticulitis is the irritation (inflammation) or infection of diverticula. CAUSES  The colon and its diverticula contain bacteria. If food particles block the tiny opening to a diverticulum, the bacteria inside can grow and cause an increase in pressure. This leads to infection and inflammation and is called diverticulitis. SYMPTOMS   Abdominal pain and tenderness. Usually, the pain is located on the left side of your abdomen. However, it could be located elsewhere.   Fever.   Bloating.   Feeling sick to your stomach (nausea).   Throwing up (vomiting).   Abnormal stools.  DIAGNOSIS  Your caregiver will take a history and perform a physical exam. Since many things can cause abdominal pain, other tests may be necessary. Tests may include:  Blood tests.   Urine tests.   X-ray of the abdomen.   CT scan of the abdomen.  Sometimes, surgery is needed to determine if diverticulitis or other conditions are causing your symptoms. TREATMENT  Most of the time, you can be treated without surgery. Treatment includes:  Resting the bowels by only having liquids for a few days. As you improve, you will need to eat a low-fiber diet.   Intravenous (IV) fluids if you are losing body fluids (dehydrated).   Antibiotic medicines that treat infections may be given.   Pain and nausea medicine, if needed.   Surgery if the inflamed  diverticulum has burst.  HOME CARE INSTRUCTIONS   Try a clear liquid diet (broth, tea, or water for as long as directed by your caregiver). You may then gradually begin a low-fiber diet as tolerated. A low-fiber diet is a diet with less than 10 grams of fiber. Choose the foods below to reduce fiber in the diet:   White breads, cereals, rice, and pasta.   Cooked fruits and vegetables or soft fresh fruits and vegetables without the skin.   Ground or well-cooked tender beef, ham, veal, lamb, pork, or poultry.   Eggs and seafood.   After your diverticulitis symptoms have improved, your caregiver may put you on a high-fiber diet. A high-fiber diet includes 14 grams of fiber for every 1000 calories consumed. For a standard 2000 calorie diet, you would need 28 grams of fiber. Follow these diet guidelines to help you increase the fiber in your diet. It is important to slowly increase the amount fiber in your diet to avoid gas, constipation, and bloating.   Choose whole-grain breads, cereals, pasta, and brown rice.   Choose fresh fruits and vegetables with the skin on. Do not overcook vegetables because the more vegetables are cooked, the more fiber is lost.   Choose more nuts, seeds, legumes, dried peas, beans, and lentils.   Look for food products that have greater than 3 grams of fiber per serving on the Nutrition Facts label.   Take all medicine as directed by your caregiver.   If your caregiver has given you a  follow-up appointment, it is very important that you go. Not going could result in lasting (chronic) or permanent injury, pain, and disability. If there is any problem keeping the appointment, call to reschedule.  SEEK MEDICAL CARE IF:   Your pain does not improve.   You have a hard time advancing your diet beyond clear liquids.   Your bowel movements do not return to normal.  SEEK IMMEDIATE MEDICAL CARE IF:   Your pain becomes worse.   You have an oral temperature above 102 F  (38.9 C), not controlled by medicine.   You have repeated vomiting.   You have bloody or black, tarry stools.   Symptoms that brought you to your caregiver become worse or are not getting better.  MAKE SURE YOU:   Understand these instructions.   Will watch your condition.   Will get help right away if you are not doing well or get worse.  Document Released: 09/05/2005 Document Revised: 08/08/2011 Document Reviewed: 01/01/2011 [facility] Patient Information 2012 [place], [place].

## 2012-02-07 NOTE — ED Notes (Signed)
abd pain since Monday nausea no vomiting

## 2012-02-07 NOTE — ED Provider Notes (Signed)
History     CSN: [id number]  Arrival date & time 02/07/12  1401   First MD Initiated Contact with Patient 02/07/12 1541      Chief Complaint  Patient presents with  . Abdominal Pain    (Consider location/radiation/quality/duration/timing/severity/associated sxs/prior treatment) HPI Comments: Patient with known history of diverticulosis without diverticulitis presenting with abdominal pain that started 4 days ago. Started her left lower quadrant is intermittent over the past days become constant and severe spreading across her entire abdomen. Associated with nausea but no vomiting. She did have a normal bowel movement today there was associated with a lot of cramping. She denies any blood in her stools. Denies any fever, chest pain, shortness of breath, dysuria or hematuria. No vaginal bleeding or discharge. She is a previous cholecystectomy and TAH BSO.  The history is provided by the patient.    Past Medical History  Diagnosis Date  . CAD (coronary artery disease)     DES to the LAD July 06, 2011  / Relook catheter July 14, 2011 stent patent, moderate mid LAD stenosis but not high grade , plan medical therapy.  Last cath Sept 2012  . Hypertension   . Hyperlipidemia   . Borderline diabetes   . Morbid obesity   . Fibromyalgia   . Osteoarthritis   . Degenerative joint disease   . Groin hematoma     July 06 2011 treated with pressure  . Hematoma     Radial catheter site July 13, 2011    Past Surgical History  Procedure Date  . Cardiac catheterization     revealing patency of the LAD stent with mild compromise of  the diagonal and moderate mid LAD stenosis, but does not appear to  be high grade.   . Cholecystectomy   . Tonsillectomy   . Carpal tunnel release   . Bunionectomy   . Knee arthroscopy   . Abdominal hysterectomy     Family History  Problem Relation Age of Onset  . Other Mother     died pulmonary issues  . Coronary artery disease Father 52    died-  CABG-HTN- AAA    History  Substance Use Topics  . Smoking status: Never Smoker   . Smokeless tobacco: Never Used  . Alcohol Use: Yes     occasional    OB History    Grav Para Term Preterm Abortions TAB SAB Ect Mult Living                  Review of Systems  Constitutional: Positive for appetite change and fatigue. Negative for fever and activity change.  HENT: Negative for congestion and rhinorrhea.   Respiratory: Negative for chest tightness and shortness of breath.   Cardiovascular: Negative for chest pain.  Gastrointestinal: Positive for nausea and abdominal pain. Negative for vomiting, diarrhea and constipation.  Genitourinary: Negative for dysuria, vaginal bleeding, vaginal discharge and dyspareunia.  Musculoskeletal: Negative for back pain.  Skin: Negative for rash.  Neurological: Negative for dizziness and headaches.  Hematological: Negative for adenopathy.    Allergies  Erythromycin; Indomethacin; Biaxin; and Penicillins  Home Medications   Current Outpatient Rx  Name Route Sig Dispense Refill  . ALBUTEROL 90 MCG/ACT IN AERS Inhalation Inhale 1-2 puffs into the lungs every 4 (four) hours as needed. For wheezing    . AMLODIPINE BESYLATE 2.5 MG PO TABS Oral Take 2.5 mg by mouth daily.    . ASPIRIN EC 81 MG PO TBEC Oral Take 81 mg  by mouth daily.    . ATORVASTATIN CALCIUM 20 MG PO TABS Oral Take 1 tablet (20 mg total) by mouth daily.    Marland Kitchen BENAZEPRIL HCL 5 MG PO TABS Oral Take 5 mg by mouth daily.      Marland Kitchen CALCIUM CARBONATE 200 MG PO CAPS Oral Take 250 mg by mouth daily.      Marland Kitchen CLOPIDOGREL BISULFATE 75 MG PO TABS Oral Take 75 mg by mouth daily.     . CYCLOBENZAPRINE HCL 10 MG PO TABS Oral Take 10 mg by mouth 3 (three) times daily as needed. For back pain    . DULOXETINE HCL 30 MG PO CPEP Oral Take 90 mg by mouth daily.     . ERGOCALCIFEROL 50000 UNITS PO CAPS Oral Take 50,000 Units by mouth once a week. Take on Saturday    . ISOSORBIDE MONONITRATE ER 30 MG PO TB24  Oral Take 15 mg by mouth daily.      Marland Kitchen METOPROLOL SUCCINATE ER 25 MG PO TB24 Oral Take 25 mg by mouth daily.    Carma Leaven M PLUS PO TABS Oral Take 1 tablet by mouth daily.      Marland Kitchen NITROGLYCERIN 0.4 MG SL SUBL Sublingual Place 0.4 mg under the tongue every 5 (five) minutes as needed. For chest pain     . PREGABALIN 75 MG PO CAPS Oral Take 225 mg by mouth 2 (two) times daily.     Marland Kitchen RANOLAZINE ER 500 MG PO TB12 Oral Take 2 tablets (1,000 mg total) by mouth 2 (two) times daily. 360 tablet 2  . TRAMADOL HCL 50 MG PO TABS Oral Take 50 mg by mouth 3 (three) times daily as needed. For pain    . VITAMIN C 500 MG PO TABS Oral Take 500 mg by mouth as needed.     Marland Kitchen CIPROFLOXACIN HCL 500 MG PO TABS Oral Take 1 tablet (500 mg total) by mouth 2 (two) times daily. 20 tablet 0  . METRONIDAZOLE 500 MG PO TABS Oral Take 1 tablet (500 mg total) by mouth 2 (two) times daily. 20 tablet 0  . ONDANSETRON HCL 4 MG PO TABS Oral Take 1 tablet (4 mg total) by mouth every 6 (six) hours. 20 tablet 0  . PERCOCET 5-325 MG PO TABS Oral Take 1 tablet by mouth every 6 (six) hours as needed for pain. 20 tablet 0    Dispense as written.    BP 114/63  Pulse 62  Temp(Src) 98.6 F (37 C) (Oral)  Resp 16  SpO2 97%  Physical Exam  Constitutional: She is oriented to person, place, and time. She appears well-developed and well-nourished. No distress.  HENT:  Head: Normocephalic and atraumatic.  Mouth/Throat: Oropharynx is clear and moist. No oropharyngeal exudate.  Eyes: Conjunctivae are normal. Pupils are equal, round, and reactive to light.  Neck: Normal range of motion.  Cardiovascular: Normal rate, regular rhythm and normal heart sounds.   Pulmonary/Chest: Effort normal and breath sounds normal. No respiratory distress.  Abdominal: Soft. There is tenderness. There is no rebound and no guarding.       Mild diffuse lower abdominal tenderness  Musculoskeletal: Normal range of motion. She exhibits no edema and no tenderness.    Neurological: She is alert and oriented to person, place, and time.  Skin: Skin is warm.    ED Course  Procedures (including critical care time)  Labs Reviewed  CBC - Abnormal; Notable for the following:    Hemoglobin 11.9 (*)  HCT 35.2 (*)    All other components within normal limits  COMPREHENSIVE METABOLIC PANEL - Abnormal; Notable for the following:    Albumin 3.3 (*)    GFR calc non Af Amer 90 (*)    All other components within normal limits  URINALYSIS, ROUTINE W REFLEX MICROSCOPIC  DIFFERENTIAL  LIPASE, BLOOD  LACTIC ACID, PLASMA   Ct Abdomen Pelvis W Contrast  02/07/2012  *RADIOLOGY REPORT*  Clinical Data: Abdominal pain, nausea, vomiting, past history of hysterectomy, cholecystectomy, hypertension, coronary artery disease  CT ABDOMEN AND PELVIS WITH CONTRAST  Technique:  Multidetector CT imaging of the abdomen and pelvis was performed following the standard protocol during bolus administration of intravenous contrast. Sagittal and coronal MPR images reconstructed from axial data set.  Contrast: OMNIPAQUE IOHEXOL 300 MG/ML IJ SOLN Dilute oral contrast.  Comparison: 02/12/2008  Findings: Minimal dependent atelectasis at lung bases. Gallbladder and uterus surgically absent by history with nonvisualization of ovaries. Liver, spleen, pancreas, kidneys, adrenal glands normal appearance. Small hiatal hernia. Scattered atherosclerotic calcifications at iliac and coronary arteries. Remainder stomach and small bowel loops normal appearance.  Diverticulosis of descending and sigmoid colon with subtle wall thickening and pericolic inflammatory changes at the distal sigmoid colon consistent with acute diverticulitis. No evidence of abscess or perforation. Bladder and ureters normal appearance. Normal appendix. No acute osseous findings.  IMPRESSION: Diverticulosis of the descending and sigmoid colon with mild distal sigmoid diverticulitis. Small hiatal hernia.  Original Report  Authenticated By: Lollie Marrow, M.D.     1. Diverticulitis       MDM  Abdominal pain with nausea. Abdomen soft but tender. Suspect diverticulitis.  Labs, IVF, UA, symptom control, CTa/p Shared visits with [facility] Ledora Bottcher, MD 02/07/12 2154

## 2012-02-21 ENCOUNTER — Encounter: Payer: Self-pay | Admitting: Cardiology

## 2012-02-22 ENCOUNTER — Emergency Department (HOSPITAL_COMMUNITY): Payer: 59

## 2012-02-22 ENCOUNTER — Encounter (HOSPITAL_COMMUNITY): Payer: Self-pay | Admitting: *Deleted

## 2012-02-22 ENCOUNTER — Observation Stay (HOSPITAL_COMMUNITY)
Admission: EM | Admit: 2012-02-22 | Discharge: 2012-02-23 | Disposition: A | Discharge status: Home / Self Care | Payer: 59 | Source: Ambulatory Visit | Attending: Internal Medicine | Admitting: Internal Medicine

## 2012-02-22 ENCOUNTER — Encounter (HOSPITAL_COMMUNITY): Payer: Self-pay

## 2012-02-22 ENCOUNTER — Other Ambulatory Visit: Payer: Self-pay

## 2012-02-22 ENCOUNTER — Emergency Department (INDEPENDENT_AMBULATORY_CARE_PROVIDER_SITE_OTHER)
Admission: EM | Admit: 2012-02-22 | Discharge: 2012-02-22 | Disposition: A | Discharge status: Nursing Home (Includes ICF) | Payer: 59 | Source: Home / Self Care

## 2012-02-22 DIAGNOSIS — R079 Chest pain, unspecified: Secondary | ICD-10-CM

## 2012-02-22 DIAGNOSIS — R42 Dizziness and giddiness: Secondary | ICD-10-CM | POA: Insufficient documentation

## 2012-02-22 DIAGNOSIS — Z9861 Coronary angioplasty status: Secondary | ICD-10-CM | POA: Insufficient documentation

## 2012-02-22 DIAGNOSIS — IMO0001 Reserved for inherently not codable concepts without codable children: Secondary | ICD-10-CM | POA: Insufficient documentation

## 2012-02-22 DIAGNOSIS — E785 Hyperlipidemia, unspecified: Secondary | ICD-10-CM | POA: Insufficient documentation

## 2012-02-22 DIAGNOSIS — R7309 Other abnormal glucose: Secondary | ICD-10-CM | POA: Insufficient documentation

## 2012-02-22 DIAGNOSIS — S301XXA Contusion of abdominal wall, initial encounter: Secondary | ICD-10-CM | POA: Insufficient documentation

## 2012-02-22 DIAGNOSIS — I10 Essential (primary) hypertension: Secondary | ICD-10-CM | POA: Insufficient documentation

## 2012-02-22 DIAGNOSIS — R0602 Shortness of breath: Secondary | ICD-10-CM | POA: Insufficient documentation

## 2012-02-22 DIAGNOSIS — M797 Fibromyalgia: Secondary | ICD-10-CM | POA: Insufficient documentation

## 2012-02-22 DIAGNOSIS — R0789 Other chest pain: Secondary | ICD-10-CM

## 2012-02-22 DIAGNOSIS — I2510 Atherosclerotic heart disease of native coronary artery without angina pectoris: Secondary | ICD-10-CM | POA: Insufficient documentation

## 2012-02-22 LAB — DIFFERENTIAL
Basophils Absolute: 0 10*3/uL (ref 0.0–0.1)
Basophils Relative: 1 % (ref 0–1)
Eosinophils Absolute: 0.2 10*3/uL (ref 0.0–0.7)
Eosinophils Relative: 3 % (ref 0–5)
Lymphs Abs: 1.9 10*3/uL (ref 0.7–4.0)
Neutrophils Relative %: 47 % (ref 43–77)

## 2012-02-22 LAB — CARDIAC PANEL(CRET KIN+CKTOT+MB+TROPI)
CK, MB: 1.7 ng/mL (ref 0.3–4.0)
CK, MB: 1.5 ng/mL (ref 0.3–4.0)
Total CK: 46 U/L (ref 7–177)
Total CK: 43 U/L (ref 7–177)
Troponin I: <0.3 ng/mL (ref <0.30)
Troponin I: <0.3 ng/mL (ref <0.30)

## 2012-02-22 LAB — CBC
MCH: 30.1 pg (ref 26.0–34.0)
MCHC: 33.7 g/dL (ref 30.0–36.0)
MCV: 89.5 fL (ref 78.0–100.0)
Platelets: 176 10*3/uL (ref 150–400)
RDW: 14.7 % (ref 11.5–15.5)

## 2012-02-22 LAB — BASIC METABOLIC PANEL
Calcium: 9.2 mg/dL (ref 8.4–10.5)
GFR calc Af Amer: >90 mL/min (ref >90)
GFR calc non Af Amer: >90 mL/min (ref >90)
Glucose, Bld: 92 mg/dL (ref 70–99)
Sodium: 143 mEq/L (ref 135–145)

## 2012-02-22 LAB — PROTIME-INR: INR: 1.02 (ref 0.00–1.49)

## 2012-02-22 MED ORDER — CYCLOBENZAPRINE HCL 10 MG PO TABS: 10.0000 mg | ORAL_TABLET | Freq: Three times a day (TID) | ORAL | Status: DC | PRN

## 2012-02-22 MED ORDER — METOPROLOL SUCCINATE ER 25 MG PO TB24
25.0000 mg | ORAL_TABLET | Freq: Every day | ORAL | Status: DC
  Administered 2012-02-23: 25 mg via ORAL
  Filled 2012-02-22: qty 1

## 2012-02-22 MED ORDER — AMLODIPINE BESYLATE 2.5 MG PO TABS
2.5000 mg | ORAL_TABLET | Freq: Every day | ORAL | Status: DC
  Administered 2012-02-23: 2.5 mg via ORAL
  Filled 2012-02-22: qty 1

## 2012-02-22 MED ORDER — ALBUTEROL SULFATE HFA 108 (90 BASE) MCG/ACT IN AERS
1.0000 | INHALATION_SPRAY | RESPIRATORY_TRACT | Status: DC | PRN
  Filled 2012-02-22: qty 6.7

## 2012-02-22 MED ORDER — DULOXETINE HCL 60 MG PO CPEP
90.0000 mg | ORAL_CAPSULE | Freq: Every day | ORAL | Status: DC
  Administered 2012-02-23: 90 mg via ORAL
  Filled 2012-02-22: qty 1

## 2012-02-22 MED ORDER — ACETAMINOPHEN 325 MG PO TABS
975.0000 mg | ORAL_TABLET | Freq: Once | ORAL | Status: AC
  Administered 2012-02-22: 975 mg via ORAL
  Filled 2012-02-22: qty 3

## 2012-02-22 MED ORDER — CALCIUM CARBONATE 200 MG PO CAPS: 250.0000 mg | ORAL_CAPSULE | Freq: Every day | ORAL | Status: DC

## 2012-02-22 MED ORDER — BENAZEPRIL HCL 5 MG PO TABS
5.0000 mg | ORAL_TABLET | Freq: Every day | ORAL | Status: DC
  Administered 2012-02-23: 5 mg via ORAL
  Filled 2012-02-22 (×2): qty 1

## 2012-02-22 MED ORDER — ZOLPIDEM TARTRATE 5 MG PO TABS: 5.0000 mg | ORAL_TABLET | Freq: Every evening | ORAL | Status: DC | PRN

## 2012-02-22 MED ORDER — PANTOPRAZOLE SODIUM 40 MG PO TBEC
40.0000 mg | DELAYED_RELEASE_TABLET | Freq: Every day | ORAL | Status: DC
Start: 2012-02-23 — End: 2012-02-23
  Administered 2012-02-23: 40 mg via ORAL
  Filled 2012-02-22: qty 1

## 2012-02-22 MED ORDER — ISOSORBIDE MONONITRATE 15 MG HALF TABLET
15.0000 mg | ORAL_TABLET | Freq: Every day | ORAL | Status: DC
  Administered 2012-02-23: 15 mg via ORAL
  Filled 2012-02-22: qty 1

## 2012-02-22 MED ORDER — NITROGLYCERIN IN D5W 200-5 MCG/ML-% IV SOLN
2.0000 ug/min | Freq: Once | INTRAVENOUS | Status: AC
  Administered 2012-02-22: 5 ug/min via INTRAVENOUS
  Filled 2012-02-22: qty 250

## 2012-02-22 MED ORDER — SODIUM CHLORIDE 0.9 % IJ SOLN
3.0000 mL | Freq: Two times a day (BID) | INTRAMUSCULAR | Status: DC
  Administered 2012-02-22 – 2012-02-23 (×2): 3 mL via INTRAVENOUS

## 2012-02-22 MED ORDER — ACETAMINOPHEN 325 MG PO TABS
650.0000 mg | ORAL_TABLET | ORAL | Status: DC | PRN
  Administered 2012-02-23: 650 mg via ORAL
  Filled 2012-02-22: qty 2

## 2012-02-22 MED ORDER — RANOLAZINE ER 500 MG PO TB12
1000.0000 mg | ORAL_TABLET | Freq: Two times a day (BID) | ORAL | Status: DC
  Administered 2012-02-22 – 2012-02-23 (×2): 1000 mg via ORAL
  Filled 2012-02-22 (×3): qty 2

## 2012-02-22 MED ORDER — SODIUM CHLORIDE 0.9 % IV SOLN: 250.0000 mL | INTRAVENOUS | Status: DC | PRN

## 2012-02-22 MED ORDER — PREGABALIN 25 MG PO CAPS
225.0000 mg | ORAL_CAPSULE | Freq: Two times a day (BID) | ORAL | Status: DC
  Administered 2012-02-22 – 2012-02-23 (×2): 225 mg via ORAL
  Filled 2012-02-22: qty 4
  Filled 2012-02-22: qty 1
  Filled 2012-02-22: qty 4

## 2012-02-22 MED ORDER — ASPIRIN EC 81 MG PO TBEC
81.0000 mg | DELAYED_RELEASE_TABLET | Freq: Every day | ORAL | Status: DC
  Administered 2012-02-23: 81 mg via ORAL
  Filled 2012-02-22: qty 1

## 2012-02-22 MED ORDER — CALCIUM CARBONATE 1250 (500 CA) MG PO TABS
1.0000 | ORAL_TABLET | Freq: Every day | ORAL | Status: DC
  Administered 2012-02-23: 500 mg via ORAL
  Filled 2012-02-22: qty 1

## 2012-02-22 MED ORDER — TRAMADOL HCL 50 MG PO TABS: 50.0000 mg | ORAL_TABLET | Freq: Three times a day (TID) | ORAL | Status: DC | PRN

## 2012-02-22 MED ORDER — ONDANSETRON HCL 4 MG/2ML IJ SOLN: 4.0000 mg | Freq: Four times a day (QID) | INTRAMUSCULAR | Status: DC | PRN

## 2012-02-22 MED ORDER — ADULT MULTIVITAMIN W/MINERALS CH
1.0000 | ORAL_TABLET | Freq: Every day | ORAL | Status: DC
  Administered 2012-02-23: 1 via ORAL
  Filled 2012-02-22: qty 1

## 2012-02-22 MED ORDER — ALBUTEROL 90 MCG/ACT IN AERS: 1.0000 | INHALATION_SPRAY | RESPIRATORY_TRACT | Status: DC | PRN

## 2012-02-22 MED ORDER — NITROGLYCERIN 0.4 MG SL SUBL
SUBLINGUAL_TABLET | SUBLINGUAL | Status: AC
  Filled 2012-02-22: qty 25

## 2012-02-22 MED ORDER — CLOPIDOGREL BISULFATE 75 MG PO TABS
75.0000 mg | ORAL_TABLET | Freq: Every day | ORAL | Status: DC
  Administered 2012-02-23: 75 mg via ORAL
  Filled 2012-02-22 (×2): qty 1

## 2012-02-22 MED ORDER — NITROGLYCERIN 0.4 MG SL SUBL
0.4000 mg | SUBLINGUAL_TABLET | SUBLINGUAL | Status: DC | PRN
  Administered 2012-02-22: 0.4 mg via SUBLINGUAL

## 2012-02-22 MED ORDER — SODIUM CHLORIDE 0.9 % IJ SOLN: 3.0000 mL | INTRAMUSCULAR | Status: DC | PRN

## 2012-02-22 MED ORDER — NITROGLYCERIN 0.4 MG SL SUBL: 0.4000 mg | SUBLINGUAL_TABLET | SUBLINGUAL | Status: DC | PRN

## 2012-02-22 NOTE — ED Notes (Signed)
Pt claimed that her CP is gone but is c/o dizziness. SBP is in the 80's, NTG drip was decreased to 2.5 mcg/min. Will monitor

## 2012-02-22 NOTE — ED Notes (Signed)
Pt was brought in by [facility] from the [facility] with c/o chest pain. Pt took 1 tablet of her own NTG and 1 Baby ASA. [facility] gave an additional 3 tablets of Baby ASA and 1 more tablet of NTG SL. Chest pain went from 7/10- 3-4/10.

## 2012-02-22 NOTE — ED Provider Notes (Addendum)
History     CSN: [id number]  Arrival date & time 02/22/12  1002   None     Chief Complaint  Patient presents with  . Chest Pain    (Consider location/radiation/quality/duration/timing/severity/associated sxs/prior treatment) Patient is a 61 y.o. female presenting with chest pain. The history is provided by the patient.  Chest Pain The chest pain began less than 1 hour ago (s/p stent lad with assoc cad., sx with coming into work this am). The chest pain is unchanged. The pain is currently at 5/10. The severity of the pain is moderate. The quality of the pain is described as aching. The pain radiates to the upper back, right jaw and left neck.     Past Medical History  Diagnosis Date  . CAD (coronary artery disease)     DES to the LAD July 06, 2011  / Relook catheter July 14, 2011 stent patent, moderate mid LAD stenosis but not high grade , plan medical therapy.  Last cath Sept 2012  . Hypertension   . Hyperlipidemia   . Borderline diabetes   . Morbid obesity   . Fibromyalgia   . Osteoarthritis   . Degenerative joint disease   . Groin hematoma     July 06 2011 treated with pressure  . Hematoma     Radial catheter site July 13, 2011    Past Surgical History  Procedure Date  . Cardiac catheterization     revealing patency of the LAD stent with mild compromise of  the diagonal and moderate mid LAD stenosis, but does not appear to  be high grade.   . Cholecystectomy   . Tonsillectomy   . Carpal tunnel release   . Bunionectomy   . Knee arthroscopy   . Abdominal hysterectomy     Family History  Problem Relation Age of Onset  . Other Mother     died pulmonary issues  . Coronary artery disease Father 54    died- CABG-HTN- AAA    History  Substance Use Topics  . Smoking status: Never Smoker   . Smokeless tobacco: Never Used  . Alcohol Use: Yes     occasional    OB History    Grav Para Term Preterm Abortions TAB SAB Ect Mult Living                  Review  of Systems  Constitutional: Negative.   Cardiovascular: Positive for chest pain.    Allergies  Erythromycin; Indomethacin; Biaxin; and Penicillins  Home Medications   Current Outpatient Rx  Name Route Sig Dispense Refill  . ALBUTEROL 90 MCG/ACT IN AERS Inhalation Inhale 1-2 puffs into the lungs every 4 (four) hours as needed. For wheezing    . AMLODIPINE BESYLATE 2.5 MG PO TABS Oral Take 2.5 mg by mouth daily.    . ASPIRIN EC 81 MG PO TBEC Oral Take 81 mg by mouth daily.    . ATORVASTATIN CALCIUM 20 MG PO TABS Oral Take 1 tablet (20 mg total) by mouth daily.    Marland Kitchen BENAZEPRIL HCL 5 MG PO TABS Oral Take 5 mg by mouth daily.      Marland Kitchen CALCIUM CARBONATE 200 MG PO CAPS Oral Take 250 mg by mouth daily.      Marland Kitchen CLOPIDOGREL BISULFATE 75 MG PO TABS Oral Take 75 mg by mouth daily.     . CYCLOBENZAPRINE HCL 10 MG PO TABS Oral Take 10 mg by mouth 3 (three) times daily as needed. For back  pain    . DULOXETINE HCL 30 MG PO CPEP Oral Take 90 mg by mouth daily.     . ERGOCALCIFEROL 50000 UNITS PO CAPS Oral Take 50,000 Units by mouth once a week. Take on Saturday    . ISOSORBIDE MONONITRATE ER 30 MG PO TB24 Oral Take 15 mg by mouth daily.      Marland Kitchen METOPROLOL SUCCINATE ER 25 MG PO TB24 Oral Take 25 mg by mouth daily.    Carma Leaven M PLUS PO TABS Oral Take 1 tablet by mouth daily.      Marland Kitchen NITROGLYCERIN 0.4 MG SL SUBL Sublingual Place 0.4 mg under the tongue every 5 (five) minutes as needed. For chest pain     . PREGABALIN 75 MG PO CAPS Oral Take 225 mg by mouth 2 (two) times daily.     Marland Kitchen RANOLAZINE ER 500 MG PO TB12 Oral Take 2 tablets (1,000 mg total) by mouth 2 (two) times daily. 360 tablet 2  . TRAMADOL HCL 50 MG PO TABS Oral Take 50 mg by mouth 3 (three) times daily as needed. For pain    . VITAMIN C 500 MG PO TABS Oral Take 500 mg by mouth as needed.       BP 114/67  Pulse 78  Temp(Src) 98.7 F (37.1 C) (Oral)  Resp 16  SpO2 98%  Physical Exam  Constitutional: She is oriented to person, place, and  time. She appears well-developed and well-nourished.  HENT:  Head: Normocephalic.  Cardiovascular: Normal rate, normal heart sounds and intact distal pulses.   Pulmonary/Chest: Effort normal.  Neurological: She is alert and oriented to person, place, and time.  Skin: Skin is warm and dry.    ED Course  Procedures (including critical care time)  Labs Reviewed - No data to display No results found.   1. Chest pain, cardiac       MDM          Linna Hoff, MD 02/22/12 1022  Linna Hoff, MD 02/23/12 [phone]

## 2012-02-22 NOTE — ED Notes (Signed)
Pale  Scale  5  Unchanged  After  The  Nitro    Pt  Does  However  Have   A  Headache         She  Reports  She  Took asa  This  Am  As  Well  As  Her plavix

## 2012-02-22 NOTE — ED Notes (Signed)
Pt    Was    On the  Way  To      Work  And  Developed     Chest   Pain        About  45  mins  Ago          She  Has  A  History  Of  Chest pain    She  Is  Awake  As  Well  As  Alert  And  Oriented         Skin is  Warm  And  Dry   Cap  Refill is  Brisk          Lungs  Are  Clear     Pain  Scale  Is  5

## 2012-02-22 NOTE — ED Notes (Signed)
RN on 2000 will call me back to get report

## 2012-02-22 NOTE — H&P (Signed)
History and Physical  Patient ID: Courtney Steele MRN: [id number], SOB: [birth year] 61 y.o. Date of Encounter: 02/22/2012, 3:03 PM  Primary Physician: Astrid Divine, MD, MD Primary Cardiologist: Jerral Bonito MD  Chief Complaint: Chest pain  HPI: 61 y.o. female w/ PMHx significant for CAD s/p DES to LAD 06/2011, DM, HTN, HLD, and obesity who presented to [facility] on 02/22/2012 with complaints of chest pain.  She underwent cardiac cath w/ DES to proximal LAD in 06/2011 and treated with ASA and Plavix. She had recurrent chest pain and had cath 07/14/11 which revealed patent stent and 50% stenosis mid LAD treated with medical therapy. She had recurrent chest pain and had cath with pressure wire analysis on 08/30/11 which revealed continued patency of prox LAD stent and nonobstructive CAD and normal pressure wire analysis of LAD. She was initiated on Imdur and Ranexa. She was last seen by Dr. Myrtis Ser 11/29/11 at which time she denied any significant chest pain. She was evaluated in the ED on 02/07/12 with abd pain and a CT abd/pelvis revealed mild distal sigmoid diverticulitis for which she was treated with abx, pain and nausea medication.  She reports having intermittent chest pain about once every other week that comes on usually with increased "frustration or stress". It is relieved with relaxation or 1 SL NTG. She is not active so does not know if she has exertional chest pain. She was feeling well until she went into work this morning when she experienced substernal chest pressure that radiated to her back, neck, and left jaw, 6/10. It is similar in nature and presentation as her "normal" chest pain. She took 1 SL NTG and told her coworkers she need to take a break. They became concerned and gave her additional NTG and arranged for her to go to the ED. She had some relief with the NTG. She denies recent illness, fever, chills, edema, orthopnea, PND, HA, dizziness, syncope, change in bladder or  bowels. She reports medication compliance with all meds.  In the ED, EKG revealed NSR with no significant ST changes. CXR was without acute cardiopulmonary abnormalities. Cardiac enzymes negative x1. Coags, BMET & CBC WNL. She was palced on IV NTG with resolution of chest pain, but developed dizziness and mild hypotension.   Past Medical History  Diagnosis Date  . CAD (coronary artery disease)     DES to the LAD July 06, 2011  / Relook catheter July 14, 2011 stent patent, moderate mid LAD stenosis but not high grade , plan medical therapy.  Last cath Sept 2012  . Hypertension   . Hyperlipidemia   . Borderline diabetes   . Morbid obesity   . Fibromyalgia   . Osteoarthritis   . Degenerative joint disease   . Groin hematoma     July 06 2011 treated with pressure  . Hematoma     Radial catheter site July 13, 2011  . Diabetes mellitus      Surgical History:  Past Surgical History  Procedure Date  . Cardiac catheterization     revealing patency of the LAD stent with mild compromise of  the diagonal and moderate mid LAD stenosis, but does not appear to  be high grade.   . Cholecystectomy   . Tonsillectomy   . Carpal tunnel release   . Bunionectomy   . Knee arthroscopy   . Abdominal hysterectomy   . Angioplasty      Home Meds: Medication Sig  amLODipine (NORVASC) 2.5 MG tablet  Take 2.5 mg by mouth daily.  aspirin EC 81 MG tablet Take 81 mg by mouth daily.  atorvastatin (LIPITOR) 20 MG tablet Take 1 tablet (20 mg total) by mouth daily.  benazepril (LOTENSIN) 5 MG tablet Take 5 mg by mouth daily.    calcium carbonate 200 MG capsule Take 250 mg by mouth daily.    clopidogrel (PLAVIX) 75 MG tablet Take 75 mg by mouth daily.   cyclobenzaprine (FLEXERIL) 10 MG tablet Take 10 mg by mouth 3 (three) times daily as needed. For back pain  DULoxetine (CYMBALTA) 30 MG capsule Take 90 mg by mouth daily.   ergocalciferol (VITAMIN D2) 50000 UNITS capsule Take 50,000 Units by mouth once a  week. Take on Saturday  isosorbide mononitrate (IMDUR) 30 MG 24 hr tablet Take 15 mg by mouth daily.    metoprolol succinate (TOPROL-XL) 25 MG 24 hr tablet Take 25 mg by mouth daily.  Multiple Vitamins-Minerals (MULTIVITAMINS THER. W/MINERALS) TABS Take 1 tablet by mouth daily.    nitroGLYCERIN (NITROSTAT) 0.4 MG SL tablet Place 0.4 mg under the tongue every 5 (five) minutes as needed. For chest pain   pregabalin (LYRICA) 75 MG capsule Take 225 mg by mouth 2 (two) times daily.   ranolazine (RANEXA) 500 MG 12 hr tablet Take 2 tablets (1,000 mg total) by mouth 2 (two) times daily.  traMADol (ULTRAM) 50 MG tablet Take 50 mg by mouth 3 (three) times daily as needed. For pain  albuterol (PROVENTIL,VENTOLIN) 90 MCG/ACT inhaler Inhale 1-2 puffs into the lungs every 4 (four) hours as needed. For wheezing    Allergies:  Allergies  Allergen Reactions  . Erythromycin Diarrhea  . Indomethacin Diarrhea  . Biaxin (Clarithromycin Er) Rash  . Penicillins Rash   Social History  . Marital Status: Married    Number of Children: 2   Occupational History  . [employer] [place] [employer]   Social History Main Topics  . Smoking status: Never Smoker   . Smokeless tobacco: Never Used  . Alcohol Use: Yes     occasional  . Drug Use: No    Family History  Problem Relation Age of Onset  . Other Mother     died pulmonary issues  . Coronary artery disease Father 78    died- CABG-HTN- AAA    Review of Systems: General: negative for chills, fever, night sweats or weight changes.  Cardiovascular: (+) chest pain; negative for  shortness of breath, dyspnea on exertion, edema, orthopnea, palpitations, paroxysmal nocturnal dyspnea  Dermatological: negative for rash Respiratory: negative for cough or wheezing Urologic: negative for hematuria Abdominal: negative for nausea, vomiting, diarrhea, bright red blood per rectum, melena, or hematemesis Neurologic: negative for visual changes, syncope, or dizziness All  other systems reviewed and are otherwise negative except as noted above.  Labs:   Component Value Date   WBC 4.5 02/22/2012   HGB 12.6 02/22/2012   HCT 37.4 02/22/2012   MCV 89.5 02/22/2012   PLT 176 02/22/2012    Lab 02/22/12 1207  NA 143  K 4.2  CL 104  CO2 29  BUN 19  CREATININE 0.70  CALCIUM 9.2  GLUCOSE 92   Basename 02/22/12 1207  CKTOTAL 46  CKMB 1.7  TROPONINI <0.30     02/22/2012 12:07  Prothrombin Time 13.6  INR 1.02    Radiology/Studies:   02/22/2012 - CXR Findings: Heart is upper limits normal in size.  No confluent opacities or effusions.  No overt edema.  No acute bony abnormality.  IMPRESSION: No active disease.      EKG: 02/22/12 @ 0913 - Sinus rhythm 82bpm, no acute changes from prior EKG  Physical Exam: Blood pressure 99/56, pulse 67, resp. rate 11, height 5\' 4"  (1.626 m), weight 220 lb (99.791 kg), SpO2 97.00%. General: Obese, white female in no acute distress. Head: Normocephalic, atraumatic, sclera non-icteric, nares are without discharge Neck: Supple. Negative for carotid bruits. JVD not elevated. Lungs: Distant breath sounds without wheezes, rales, or rhonchi. Breathing is unlabored. LN neg Back without CVA tenderness Skin warm and dry Heart: Distant heart sounds. RRR with S1 S2. No murmurs, rubs, or gallops appreciated. Abdomen: Obese, Soft, non-tender, non-distended with normoactive bowel sounds. No rebound/guarding. No obvious abdominal masses. Msk:  Strength and tone appear normal for age. Extremities: No edema. No clubbing or cyanosis. Distal pedal pulses are 2+ and equal bilaterally. Neuro: Alert and oriented X 3. Moves all extremities spontaneously. Psych:  Responds to questions appropriately with a normal affect.    ASSESSMENT AND PLAN:   61 y.o. female w/ PMHx significant for CAD s/p DES to LAD 06/2011, DM, HTN, HLD, and obesity who presented to [facility] on 02/22/2012 with complaints of chest pain.  1. Chest Pain: She had DES  placed to the prox LAD last year with continued patency of the stent on subsequent caths and otherwise nonobstructive disease. She has been compliant with Plavix and other meds. She has cardiac risk factors including sedentary lifestyle, obesity, HTN, HLD, and diabetes. She now presents with recurrence of chest pain relieved with NTG and similar in nature to chest pain prior to stenting. She is hemodynamically stable and chest pain free on IV NTG. Will need admission for further evaluation and treatment. - Admit to telemetry - Cycle cardiac enzymes - Stop IV NTG with current hypotension, dizziness and HA - Plan for [facility] as outpatient if she rules out - Add PPI   Signed, HOPE, JESSICA PA-C 02/22/2012, 3:03 PM  Agree with above     This is a troubling issue with recurring chest pain and known disease.  This episode is no different from those she has had every few weeks over recent months but as she was at work her coworkers felt compelled to send her to the hospital  In general it is much improved with ranexa     I will add PPI and we have  arranged outpt myoview as noninvasive baseline--3/21  She will be rescheduled withJK as his schedule allows  Her BP limits her antiischemic drugs but will try and increase her imdur a little bit

## 2012-02-22 NOTE — ED Notes (Signed)
Iv  Ns  tko  20  Angio   r  Arm  1  Att  Placed  On  Cardiac  Monitor  Nasal o2  At  2  l  /  Min

## 2012-02-22 NOTE — ED Notes (Signed)
Report to RN on 2000

## 2012-02-22 NOTE — ED Notes (Signed)
Dr. Lynett Grimes was notified that the NTG drip was stopped due to patient's low SBP at 80's. Pt denies any chest discomfort at present. NSR on the monitor. Family is at the bedside

## 2012-02-22 NOTE — ED Provider Notes (Signed)
History     CSN: [id number]  Arrival date & time 02/22/12  1117   First MD Initiated Contact with Patient 02/22/12 1157      Chief Complaint  Patient presents with  . Chest Pain    (Consider location/radiation/quality/duration/timing/severity/associated sxs/prior treatment) Patient is a 61 y.o. female presenting with chest pain. The history is provided by the patient and medical records.  Chest Pain Episode onset: About 9:50 AM. Duration of episode(s) is 1 hour. Chest pain occurs constantly. The chest pain is improving. The pain is associated with exertion. At its most intense, the pain is at 8/10. The pain is currently at 2/10. The severity of the pain is mild. The quality of the pain is described as dull and pressure-like. The pain does not radiate. Chest pain is worsened by exertion. Primary symptoms include shortness of breath. Pertinent negatives for primary symptoms include no fever, no fatigue, no syncope, no cough, no wheezing, no palpitations, no abdominal pain, no nausea, no vomiting, no dizziness and no altered mental status.  Pertinent negatives for associated symptoms include no claudication, no diaphoresis, no lower extremity edema, no near-syncope, no numbness, no orthopnea, no paroxysmal nocturnal dyspnea and no weakness. She tried nitroglycerin for the symptoms. Risk factors: Known coronary artery disease with stent to left anterior descending coronary artery place approximately one year ago.  Her past medical history is significant for CAD, hyperlipidemia and hypertension.  Pertinent negatives for past medical history include no arrhythmia, no diabetes, no MI and no PE.  Her family medical history is significant for CAD in family.  Procedure history is positive for cardiac catheterization.     Past Medical History  Diagnosis Date  . CAD (coronary artery disease)     DES to the LAD July 06, 2011  / Relook catheter July 14, 2011 stent patent, moderate mid LAD stenosis  but not high grade , plan medical therapy.  Last cath Sept 2012  . Hypertension   . Hyperlipidemia   . Borderline diabetes   . Morbid obesity   . Fibromyalgia   . Osteoarthritis   . Degenerative joint disease   . Groin hematoma     July 06 2011 treated with pressure  . Hematoma     Radial catheter site July 13, 2011  . Diabetes mellitus     Past Surgical History  Procedure Date  . Cardiac catheterization     revealing patency of the LAD stent with mild compromise of  the diagonal and moderate mid LAD stenosis, but does not appear to  be high grade.   . Cholecystectomy   . Tonsillectomy   . Carpal tunnel release   . Bunionectomy   . Knee arthroscopy   . Abdominal hysterectomy   . Angioplasty     Family History  Problem Relation Age of Onset  . Other Mother     died pulmonary issues  . Coronary artery disease Father 16    died- CABG-HTN- AAA    History  Substance Use Topics  . Smoking status: Never Smoker   . Smokeless tobacco: Never Used  . Alcohol Use: Yes     occasional    OB History    Grav Para Term Preterm Abortions TAB SAB Ect Mult Living                  Review of Systems  Constitutional: Negative for fever, chills, diaphoresis, activity change, appetite change and fatigue.  HENT: Negative for congestion, facial swelling, rhinorrhea, neck  pain, neck stiffness and postnasal drip.   Eyes: Negative.   Respiratory: Positive for shortness of breath. Negative for cough, choking, chest tightness and wheezing.        Dyspnea on exertion  Cardiovascular: Positive for chest pain. Negative for palpitations, orthopnea, claudication, leg swelling, syncope and near-syncope.  Gastrointestinal: Negative for nausea, vomiting, abdominal pain and abdominal distention.  Musculoskeletal: Negative.   Skin: Negative.   Neurological: Negative for dizziness, syncope, speech difficulty, weakness, light-headedness, numbness and headaches.  Hematological: Does not bruise/bleed  easily.  Psychiatric/Behavioral: Negative.  Negative for altered mental status.    Allergies  Erythromycin; Indomethacin; Biaxin; and Penicillins  Home Medications   Current Outpatient Rx  Name Route Sig Dispense Refill  . AMLODIPINE BESYLATE 2.5 MG PO TABS Oral Take 2.5 mg by mouth daily.    . ASPIRIN EC 81 MG PO TBEC Oral Take 81 mg by mouth daily.    . ATORVASTATIN CALCIUM 20 MG PO TABS Oral Take 1 tablet (20 mg total) by mouth daily.    Marland Kitchen BENAZEPRIL HCL 5 MG PO TABS Oral Take 5 mg by mouth daily.      Marland Kitchen CALCIUM CARBONATE 200 MG PO CAPS Oral Take 250 mg by mouth daily.      Marland Kitchen CLOPIDOGREL BISULFATE 75 MG PO TABS Oral Take 75 mg by mouth daily.     . CYCLOBENZAPRINE HCL 10 MG PO TABS Oral Take 10 mg by mouth 3 (three) times daily as needed. For back pain    . DULOXETINE HCL 30 MG PO CPEP Oral Take 90 mg by mouth daily.     . ERGOCALCIFEROL 50000 UNITS PO CAPS Oral Take 50,000 Units by mouth once a week. Take on Saturday    . ISOSORBIDE MONONITRATE ER 30 MG PO TB24 Oral Take 15 mg by mouth daily.      Marland Kitchen METOPROLOL SUCCINATE ER 25 MG PO TB24 Oral Take 25 mg by mouth daily.    Carma Leaven M PLUS PO TABS Oral Take 1 tablet by mouth daily.      Marland Kitchen NITROGLYCERIN 0.4 MG SL SUBL Sublingual Place 0.4 mg under the tongue every 5 (five) minutes as needed. For chest pain     . PREGABALIN 75 MG PO CAPS Oral Take 225 mg by mouth 2 (two) times daily.     Marland Kitchen RANOLAZINE ER 500 MG PO TB12 Oral Take 2 tablets (1,000 mg total) by mouth 2 (two) times daily. 360 tablet 2  . TRAMADOL HCL 50 MG PO TABS Oral Take 50 mg by mouth 3 (three) times daily as needed. For pain    . ALBUTEROL 90 MCG/ACT IN AERS Inhalation Inhale 1-2 puffs into the lungs every 4 (four) hours as needed. For wheezing      BP 102/53  Pulse 65  Resp 16  Ht 5\' 4"  (1.626 m)  Wt 220 lb (99.791 kg)  BMI 37.76 kg/m2  SpO2 99%  Physical Exam  Nursing note and vitals reviewed. Constitutional: She is oriented to person, place, and time. She  appears well-developed and well-nourished. No distress.  HENT:  Head: Normocephalic and atraumatic.  Mouth/Throat: Oropharynx is clear and moist.  Eyes: EOM are normal. Pupils are equal, round, and reactive to light.  Neck: Normal range of motion. Neck supple. No JVD present. No tracheal deviation present.  Cardiovascular: Normal rate, regular rhythm, normal heart sounds and intact distal pulses.   No extrasystoles are present. Exam reveals no gallop, no S3, no S4 and no friction  rub.   No murmur heard. Pulmonary/Chest: Breath sounds normal. No accessory muscle usage or stridor. Not tachypneic. No respiratory distress. She has no wheezes. She has no rales. She exhibits no tenderness.  Abdominal: Soft. Bowel sounds are normal. She exhibits no distension. There is no tenderness.  Musculoskeletal: Normal range of motion. She exhibits no edema and no tenderness.  Neurological: She is alert and oriented to person, place, and time. She has normal reflexes. No cranial nerve deficit. Coordination normal.  Skin: Skin is warm and dry. No rash noted. She is not diaphoretic. No erythema. No pallor.  Psychiatric: She has a normal mood and affect. Her behavior is normal. Judgment and thought content normal.    ED Course  Procedures (including critical care time)   Date: 02/22/2012  Rate: 82  Rhythm: normal sinus rhythm  QRS Axis: normal  Intervals: normal  ST/T Wave abnormalities: normal  Conduction Disutrbances: none  Narrative Interpretation: unremarkable       Labs Reviewed  CARDIAC PANEL(CRET KIN+CKTOT+MB+TROPI)  CBC  DIFFERENTIAL  BASIC METABOLIC PANEL  PROTIME-INR  APTT   Dg Chest Port 1 View  02/22/2012  *RADIOLOGY REPORT*  Clinical Data: Chest pain.  PORTABLE CHEST - 1 VIEW  Comparison: 10/29/2011  Findings: Heart is upper limits normal in size.  No confluent opacities or effusions.  No overt edema.  No acute bony abnormality.  IMPRESSION: No active disease.  Original Report  Authenticated By: Cyndie Chime, M.D.     1. CAD (coronary artery disease)       MDM  The patient has known coronary artery disease and risk factors for same, with a stent placed to her left anterior descending coronary artery approximately one year ago, and she also has a history of stable angina but she tells me her cardiologist has instructed her that she can take up to 3 sublingual nitroglycerin before presenting to the emergency department for evaluation. It is unclear if this is stable or unstable angina, as there is no EKG change and no sign of myocardial infarction, but the patient's pain did persist beyond the 3 nitroglycerin that usually resolve it, requiring IV nitroglycerin drip to eventually become chest pain-free. I feel like the patient needs to see cardiology at least in the emergency department for evaluation and consultation before being discharged home if they feel that is appropriate for management of her case. Otherwise, I would defer to admission of the patient for further evaluation prior to discharge home.       Felisa Bonier, MD 02/22/12 [phone]

## 2012-02-23 DIAGNOSIS — R0789 Other chest pain: Secondary | ICD-10-CM

## 2012-02-23 LAB — CARDIAC PANEL(CRET KIN+CKTOT+MB+TROPI)
CK, MB: 1.4 ng/mL (ref 0.3–4.0)
Troponin I: <0.3 ng/mL (ref <0.30)

## 2012-02-23 MED ORDER — PANTOPRAZOLE SODIUM 40 MG PO TBEC: 40.0000 mg | DELAYED_RELEASE_TABLET | Freq: Every day | ORAL | Status: DC

## 2012-02-23 NOTE — Progress Notes (Signed)
Pt. Discharged 02/23/2012  3:31 PM Discharge instructions reviewed with patient/family. Patient/family verbalized understanding. All Rx's given. Questions answered as needed. Pt. Discharged to home with family/self.  Annie Sable

## 2012-02-23 NOTE — Progress Notes (Signed)
Pt ambulated 550 ft independently and tolerated activity well. Will continue to monitor.  

## 2012-02-23 NOTE — Discharge Summary (Signed)
Patient ID: Courtney Steele,  MRN: [id number], DOB/AGE: Jan 20, 1951 61 y.o.  Admit date: 02/22/2012 Discharge date: 02/23/2012  Primary Care Provider: Astrid Divine, MD Primary Cardiologist: Lovena Neighbours, MD  Discharge Diagnoses Principal Problem:  *Midsternal chest pain Active Problems:  CAD (coronary artery disease)  Hypertension  Hyperlipidemia  Fibromyalgia  Groin hematoma  Dizziness   Allergies Allergies  Allergen Reactions  . Erythromycin Diarrhea  . Indomethacin Diarrhea  . Biaxin (Clarithromycin Er) Rash  . Penicillins Rash    Procedures  None  History of Present Illness  61 y/o female with the above problem list.  She is s/p DES to the proximal LAD in July 2012.  She has had 2 caths since then, each showing patency of the LAD stent.  Over approximately 1 week prior to admission, she began to experience intermittent sscp which relieved with rest or SL NTG.  On the day of admission, she had recurrent c/p while @ work, with radiation to her back, neck, and left jaw.  She took 2 SL NTG @ work and when symptoms did not abate, she presented to the [facility] ED.  In the ED, her ECG showed no acute st/t changes.  CE were negative and pain resolved after IV NTG.  She was admitted for further evaluation.  Hospital Course  Following admission, pt had no further chest pain.  Cardiac enzymes have remained negative and there is no objective evidence of ischemia by ECG.  She will be discharged home today in good condition.  We have arranged for an outpatient myoview next week.  Discharge Vitals Blood pressure 120/60, pulse 71, temperature 97.6 F (36.4 C), temperature source Oral, resp. rate 17, height 5\' 4"  (1.626 m), weight 220 lb (99.791 kg), SpO2 96.00%.  Filed Weights   02/22/12 1124  Weight: 220 lb (99.791 kg)   Labs  CBC  Basename 02/22/12 1207  WBC 4.5  NEUTROABS 2.1  HGB 12.6  HCT 37.4  MCV 89.5  PLT 176   Basic Metabolic Panel  Basename 02/22/12 1207  NA  143  K 4.2  CL 104  CO2 29  GLUCOSE 92  BUN 19  CREATININE 0.70  CALCIUM 9.2  MG --  PHOS --   Cardiac Enzymes  Basename 02/23/12 0930 02/23/12 0302 02/22/12 2056  CKTOTAL 43 40 43  CKMB 1.4 1.4 1.5  CKMBINDEX -- -- --  TROPONINI <0.30 <0.30 <0.30   Disposition  Pt is being discharged home today in good condition.  Follow-up Plans & Appointments  Follow-up Information    Follow up with [facility] on 02/28/2012. (Your stress test is scheduled for 11:45. Please arrive 15 minutes early. Our office will call you for further instructions)    Contact information:   [facility] [address] [place] [address] [place] [address] [phone]      Follow up with Willa Rough, MD on 04/09/2012. (2:45)    Contact information:   [facility] [address] [place] [address] [place] [address] [phone]       Follow up with Astrid Divine, MD .         Discharge Medications  Medication List  As of 02/23/2012  2:06 PM   TAKE these medications         albuterol 90 MCG/ACT inhaler   Commonly known as: PROVENTIL,VENTOLIN   Inhale 1-2 puffs into the lungs every 4 (four) hours as needed. For wheezing      amLODipine 2.5 MG tablet  Commonly known as: NORVASC   Take 2.5 mg by mouth daily.      aspirin EC 81 MG tablet   Take 81 mg by mouth daily.      benazepril 5 MG tablet   Commonly known as: LOTENSIN   Take 5 mg by mouth daily.      calcium carbonate 200 MG capsule   Take 250 mg by mouth daily.      clopidogrel 75 MG tablet   Commonly known as: PLAVIX   Take 75 mg by mouth daily.      cyclobenzaprine 10 MG tablet   Commonly known as: FLEXERIL   Take 10 mg by mouth 3 (three) times daily as needed. For back pain      DULoxetine 30 MG capsule   Commonly known as: CYMBALTA   Take 90 mg by mouth daily.      ergocalciferol 50000 UNITS capsule   Commonly known as: VITAMIN D2   Take 50,000 Units by  mouth once a week. Take on Saturday      isosorbide mononitrate 30 MG 24 hr tablet   Commonly known as: IMDUR   Take 15 mg by mouth daily.      LIPITOR 20 MG tablet   Generic drug: atorvastatin   Take 1 tablet (20 mg total) by mouth daily.      metoprolol succinate 25 MG 24 hr tablet   Commonly known as: TOPROL-XL   Take 25 mg by mouth daily.      multivitamins ther. w/minerals Tabs   Take 1 tablet by mouth daily.      nitroGLYCERIN 0.4 MG SL tablet   Commonly known as: NITROSTAT   Place 0.4 mg under the tongue every 5 (five) minutes as needed. For chest pain      pantoprazole 40 MG tablet   Commonly known as: PROTONIX   Take 1 tablet (40 mg total) by mouth daily at 12 noon.      pregabalin 75 MG capsule   Commonly known as: LYRICA   Take 225 mg by mouth 2 (two) times daily.      ranolazine 500 MG 12 hr tablet   Commonly known as: RANEXA   Take 2 tablets (1,000 mg total) by mouth 2 (two) times daily.      traMADol 50 MG tablet   Commonly known as: ULTRAM   Take 50 mg by mouth 3 (three) times daily as needed. For pain            Outstanding Labs/Studies  Myoview stress test 02/28/2012  Duration of Discharge Encounter   Greater than 30 minutes including physician time.  Signed, Nicolasa Ducking NP 02/23/2012, 2:06 PM

## 2012-02-23 NOTE — Progress Notes (Signed)
@   Subjective:  Denies CP or dyspnea   Objective:  Filed Vitals:   02/22/12 2000 02/22/12 2037 02/23/12 0510 02/23/12 1021  BP: 95/43 98/55 106/53 120/60  Pulse: 73 74 71   Temp:  97.7 F (36.5 C) 97.6 F (36.4 C)   TempSrc:  Oral Oral   Resp: 15 16 17    Height:      Weight:      SpO2: 98% 94% 96%     Intake/Output from previous day:  Intake/Output Summary (Last 24 hours) at 02/23/12 1044 Last data filed at 02/23/12 0300  Gross per 24 hour  Intake      0 ml  Output    600 ml  Net   -600 ml    Physical Exam: Physical exam: Well-developed well-nourished in no acute distress.  Skin is warm and dry.  HEENT is normal.  Neck is supple.  Chest is clear to auscultation with normal expansion.  Cardiovascular exam is regular rate and rhythm.  Abdominal exam nontender or distended. No masses palpated. Extremities show no edema. neuro grossly intact    Lab Results: Basic Metabolic Panel:  [facility] 02/22/12 1207  NA 143  K 4.2  CL 104  CO2 29  GLUCOSE 92  BUN 19  CREATININE 0.70  CALCIUM 9.2  MG --  PHOS --   CBC:  Basename 02/22/12 1207  WBC 4.5  NEUTROABS 2.1  HGB 12.6  HCT 37.4  MCV 89.5  PLT 176   Cardiac Enzymes:  Basename 02/23/12 0302 02/22/12 2056 02/22/12 1207  CKTOTAL 40 43 46  CKMB 1.4 1.5 1.7  CKMBINDEX -- -- --  TROPONINI <0.30 <0.30 <0.30     Assessment/Plan:  #1-chest pain-patient's enzymes are negative. As outlined by Dr. Graciela Husbands plan discharge today with outpatient Myoview followed by office visit with Dr. Myrtis Ser. #2-coronary artery disease-continue aspirin, Plavix, statin. Discharge on preadmission medications. #3-hypertension-blood pressure controlled. #4-Hyperlipidemia-continue statin. Greater than 30 minutes PA and physician time. Discharge 2 Olga Millers 02/23/2012, 10:44 AM

## 2012-02-23 NOTE — Discharge Instructions (Signed)
***  PLEASE REMEMBER TO BRING ALL OF YOUR MEDICATIONS TO EACH OF YOUR FOLLOW-UP OFFICE VISITS.  

## 2012-02-24 NOTE — Discharge Summary (Signed)
See progress notes Aniketh Huberty  

## 2012-02-26 ENCOUNTER — Other Ambulatory Visit (HOSPITAL_COMMUNITY): Payer: Self-pay | Admitting: Cardiology

## 2012-02-26 ENCOUNTER — Ambulatory Visit: Payer: 59 | Admitting: Cardiology

## 2012-02-26 DIAGNOSIS — R079 Chest pain, unspecified: Secondary | ICD-10-CM

## 2012-02-28 ENCOUNTER — Ambulatory Visit (HOSPITAL_COMMUNITY): Payer: 59 | Attending: Cardiovascular Disease | Admitting: Radiology

## 2012-02-28 DIAGNOSIS — R079 Chest pain, unspecified: Secondary | ICD-10-CM

## 2012-02-28 DIAGNOSIS — R0989 Other specified symptoms and signs involving the circulatory and respiratory systems: Secondary | ICD-10-CM

## 2012-03-04 ENCOUNTER — Ambulatory Visit (HOSPITAL_COMMUNITY): Payer: 59 | Attending: Cardiology | Admitting: Radiology

## 2012-03-04 VITALS — BP 114/67 | Ht 64.0 in | Wt 222.0 lb

## 2012-03-04 DIAGNOSIS — R5381 Other malaise: Secondary | ICD-10-CM | POA: Insufficient documentation

## 2012-03-04 DIAGNOSIS — R002 Palpitations: Secondary | ICD-10-CM | POA: Insufficient documentation

## 2012-03-04 DIAGNOSIS — I2510 Atherosclerotic heart disease of native coronary artery without angina pectoris: Secondary | ICD-10-CM

## 2012-03-04 DIAGNOSIS — E119 Type 2 diabetes mellitus without complications: Secondary | ICD-10-CM | POA: Insufficient documentation

## 2012-03-04 DIAGNOSIS — R0989 Other specified symptoms and signs involving the circulatory and respiratory systems: Secondary | ICD-10-CM | POA: Insufficient documentation

## 2012-03-04 DIAGNOSIS — R0602 Shortness of breath: Secondary | ICD-10-CM

## 2012-03-04 DIAGNOSIS — R079 Chest pain, unspecified: Secondary | ICD-10-CM | POA: Insufficient documentation

## 2012-03-04 DIAGNOSIS — R55 Syncope and collapse: Secondary | ICD-10-CM | POA: Insufficient documentation

## 2012-03-04 DIAGNOSIS — R42 Dizziness and giddiness: Secondary | ICD-10-CM | POA: Insufficient documentation

## 2012-03-04 DIAGNOSIS — R0609 Other forms of dyspnea: Secondary | ICD-10-CM | POA: Insufficient documentation

## 2012-03-04 DIAGNOSIS — E785 Hyperlipidemia, unspecified: Secondary | ICD-10-CM

## 2012-03-04 DIAGNOSIS — Z8249 Family history of ischemic heart disease and other diseases of the circulatory system: Secondary | ICD-10-CM | POA: Insufficient documentation

## 2012-03-04 DIAGNOSIS — I10 Essential (primary) hypertension: Secondary | ICD-10-CM | POA: Insufficient documentation

## 2012-03-04 DIAGNOSIS — Z9861 Coronary angioplasty status: Secondary | ICD-10-CM | POA: Insufficient documentation

## 2012-03-04 MED ORDER — REGADENOSON 0.4 MG/5ML IV SOLN
0.4000 mg | Freq: Once | INTRAVENOUS | Status: AC
  Administered 2012-03-04: 0.4 mg via INTRAVENOUS

## 2012-03-04 MED ORDER — TECHNETIUM TC 99M TETROFOSMIN IV KIT
33.0000 | PACK | Freq: Once | INTRAVENOUS | Status: AC | PRN
  Administered 2012-03-04: 33 via INTRAVENOUS

## 2012-03-04 NOTE — Progress Notes (Signed)
[facility] SITE 3 NUCLEAR MED [address] [place] [address] [phone]  Cardiology Nuclear Med Study  Courtney Steele is a 61 y.o. female     MRN : [id number]     DOB: 16-Dec-1950  Procedure Date: 03/04/2012  Nuclear Med Background Indication for Stress Test:  Evaluation for Ischemia, PTCA/Stent Patency, and Patient seen in hospital on 02/22/12 for CP radiating to upper back, (R) jaw, and (L) neck, Enzymes negative History:  11/10 Myocardial Perfusion Study-NL, EF=61%, 07/06/11 PTCA/Stent LAD, 08/28/11 Heart Catheterization-70% DIAG, 50% mid LAD beyond stent, Patent LAD stent Cardiac Risk Factors: Family History - CAD, Hypertension, Lipids and NIDDM  Symptoms: Chest Pain and Chest Pressure with/without exertion (last occurrence 30 minutes ago),  DOE, Fatigue, Fatigue with Exertion, Light-Headedness, Near Syncope and Palpitations   Nuclear Pre-Procedure Caffeine/Decaff Intake:  None> 12 hrs NPO After: 8:00am   Lungs:  clear O2 Sat: 97% on room air. IV 0.9% NS with Angio Cath:  22g  IV Site: R Antecubital x 1, tolerated well IV Started by:  Irean Hong, RN  Chest Size (in):  42 Cup Size: G  Height: 5\' 4"  (1.626 m)  Weight:  222 lb (100.699 kg)  BMI:  Body mass index is 38.11 kg/(m^2). Tech Comments:  Took metoprolol this am    Nuclear Med Study 1 or 2 day study: 2 day  Stress Test Type:  Lexiscan  Reading MD: Olga Millers, MD  Order Authorizing Provider:  Willa Rough, MD  Resting Radionuclide: Technetium 64m Tetrofosmin  Resting Radionuclide Dose: 31.3 mCi on 02/28/12   Stress Radionuclide:  Technetium 1m Tetrofosmin  Stress Radionuclide Dose: 33.0 mCi on 03/04/12           Stress Protocol Rest HR: 75 Stress HR: 101  Rest BP: 114/67 Stress BP: 117/55  Exercise Time (min): n/a METS: n/a   Predicted Max HR: 160 bpm % Max HR: 63.12 bpm Rate Pressure Product: 46962   Dose of Adenosine (mg):  n/a Dose of Lexiscan: 0.4 mg  Dose of Atropine (mg): n/a  Dose of Dobutamine: n/a mcg/kg/min (at max HR)  Stress Test Technologist: Irean Hong, RN  Nuclear Technologist:  Domenic Polite, CNMT     Rest Procedure:  Myocardial perfusion imaging was performed at rest 45 minutes following the intravenous administration of Technetium 22m Tetrofosmin. Rest ECG: NSR  Stress Procedure:  The patient received IV Lexiscan 0.4 mg over 15-seconds.  Technetium 47m Tetrofosmin injected at 30-seconds.  There were no significant changes with Lexiscan.The patient complained of chest tightness.  Quantitative spect images were obtained after a 45 minute delay. Stress ECG: No significant ST segment change suggestive of ischemia.  QPS Raw Data Images:  Acquisition technically good; normal left ventricular size. Stress Images:  Normal homogeneous uptake in all areas of the myocardium. Rest Images:  Normal homogeneous uptake in all areas of the myocardium. Subtraction (SDS):  No evidence of ischemia. Transient Ischemic Dilatation (Normal <1.22):  1.26 Lung/Heart Ratio (Normal <0.45):  0.35  Quantitative Gated Spect Images QGS EDV:  73 ml QGS ESV:  21 ml  Impression Exercise Capacity:  Lexiscan with no exercise. BP Response:  Normal blood pressure response. Clinical Symptoms:  There is dyspnea and chest tightness. ECG Impression:  No significant ST segment change suggestive of ischemia. Comparison with Prior Nuclear Study: No significant change from previous study  Overall Impression:  Normal stress nuclear study.  LV Ejection Fraction: 70%.  LV Wall Motion:  NL LV Function; NL  Wall Motion    Olga Millers

## 2012-03-18 ENCOUNTER — Telehealth: Payer: Self-pay | Admitting: Cardiology

## 2012-03-18 NOTE — Telephone Encounter (Signed)
Follow-up:     Patient returned your phone call. Please call back.  Patient is aware that you are not here today.

## 2012-03-20 NOTE — Telephone Encounter (Signed)
Pt was notified of myoview results.

## 2012-04-09 ENCOUNTER — Ambulatory Visit: Payer: 59 | Admitting: Cardiology

## 2012-04-10 ENCOUNTER — Encounter (HOSPITAL_COMMUNITY): Payer: Self-pay | Admitting: Emergency Medicine

## 2012-04-10 ENCOUNTER — Emergency Department (HOSPITAL_COMMUNITY)
Admission: EM | Admit: 2012-04-10 | Discharge: 2012-04-10 | Disposition: A | Discharge status: Home / Self Care | Payer: 59 | Attending: Emergency Medicine | Admitting: Emergency Medicine

## 2012-04-10 ENCOUNTER — Emergency Department (HOSPITAL_COMMUNITY): Payer: 59

## 2012-04-10 DIAGNOSIS — R110 Nausea: Secondary | ICD-10-CM | POA: Insufficient documentation

## 2012-04-10 DIAGNOSIS — E785 Hyperlipidemia, unspecified: Secondary | ICD-10-CM | POA: Insufficient documentation

## 2012-04-10 DIAGNOSIS — I2510 Atherosclerotic heart disease of native coronary artery without angina pectoris: Secondary | ICD-10-CM | POA: Insufficient documentation

## 2012-04-10 DIAGNOSIS — K5792 Diverticulitis of intestine, part unspecified, without perforation or abscess without bleeding: Secondary | ICD-10-CM

## 2012-04-10 DIAGNOSIS — E119 Type 2 diabetes mellitus without complications: Secondary | ICD-10-CM | POA: Insufficient documentation

## 2012-04-10 DIAGNOSIS — R1032 Left lower quadrant pain: Secondary | ICD-10-CM | POA: Insufficient documentation

## 2012-04-10 DIAGNOSIS — Z7982 Long term (current) use of aspirin: Secondary | ICD-10-CM | POA: Insufficient documentation

## 2012-04-10 DIAGNOSIS — M1990 Unspecified osteoarthritis, unspecified site: Secondary | ICD-10-CM | POA: Insufficient documentation

## 2012-04-10 DIAGNOSIS — Z79899 Other long term (current) drug therapy: Secondary | ICD-10-CM | POA: Insufficient documentation

## 2012-04-10 DIAGNOSIS — K5732 Diverticulitis of large intestine without perforation or abscess without bleeding: Secondary | ICD-10-CM | POA: Insufficient documentation

## 2012-04-10 DIAGNOSIS — I10 Essential (primary) hypertension: Secondary | ICD-10-CM | POA: Insufficient documentation

## 2012-04-10 DIAGNOSIS — R197 Diarrhea, unspecified: Secondary | ICD-10-CM | POA: Insufficient documentation

## 2012-04-10 LAB — CBC
HCT: 39 % (ref 36.0–46.0)
Hemoglobin: 13.5 g/dL (ref 12.0–15.0)
MCH: 30 pg (ref 26.0–34.0)
MCHC: 34.6 g/dL (ref 30.0–36.0)
MCV: 86.7 fL (ref 78.0–100.0)
RBC: 4.5 MIL/uL (ref 3.87–5.11)

## 2012-04-10 LAB — LIPASE, BLOOD: Lipase: 26 U/L (ref 11–59)

## 2012-04-10 LAB — COMPREHENSIVE METABOLIC PANEL
Albumin: 3.8 g/dL (ref 3.5–5.2)
Alkaline Phosphatase: 72 U/L (ref 39–117)
BUN: 18 mg/dL (ref 6–23)
Creatinine, Ser: 0.77 mg/dL (ref 0.50–1.10)
GFR calc Af Amer: >90 mL/min (ref >90)
Glucose, Bld: 115 mg/dL — ABNORMAL HIGH (ref 70–99)
Total Bilirubin: 0.9 mg/dL (ref 0.3–1.2)
Total Protein: 6.8 g/dL (ref 6.0–8.3)

## 2012-04-10 LAB — DIFFERENTIAL
Basophils Relative: 0 % (ref 0–1)
Eosinophils Absolute: 0.1 10*3/uL (ref 0.0–0.7)
Lymphs Abs: 1.4 10*3/uL (ref 0.7–4.0)
Monocytes Absolute: 0.4 10*3/uL (ref 0.1–1.0)
Monocytes Relative: 7 % (ref 3–12)

## 2012-04-10 LAB — URINALYSIS, ROUTINE W REFLEX MICROSCOPIC
Bilirubin Urine: NEGATIVE
Ketones, ur: NEGATIVE mg/dL
Nitrite: NEGATIVE
Specific Gravity, Urine: 1.019 (ref 1.005–1.030)
Urobilinogen, UA: 0.2 mg/dL (ref 0.0–1.0)
pH: 5.5 (ref 5.0–8.0)

## 2012-04-10 LAB — URINE MICROSCOPIC-ADD ON

## 2012-04-10 MED ORDER — ONDANSETRON HCL 4 MG/2ML IJ SOLN
4.0000 mg | Freq: Once | INTRAMUSCULAR | Status: AC
  Administered 2012-04-10: 4 mg via INTRAVENOUS

## 2012-04-10 MED ORDER — METRONIDAZOLE 500 MG PO TABS
500.0000 mg | ORAL_TABLET | Freq: Once | ORAL | Status: AC
  Administered 2012-04-10: 500 mg via ORAL
  Filled 2012-04-10: qty 1

## 2012-04-10 MED ORDER — CIPROFLOXACIN HCL 500 MG PO TABS: 500.0000 mg | ORAL_TABLET | Freq: Two times a day (BID) | ORAL | Status: AC

## 2012-04-10 MED ORDER — CIPROFLOXACIN HCL 500 MG PO TABS
500.0000 mg | ORAL_TABLET | Freq: Once | ORAL | Status: AC
  Administered 2012-04-10: 500 mg via ORAL
  Filled 2012-04-10: qty 1

## 2012-04-10 MED ORDER — FENTANYL CITRATE 0.05 MG/ML IJ SOLN
50.0000 ug | Freq: Once | INTRAMUSCULAR | Status: AC
  Administered 2012-04-10: 50 ug via INTRAVENOUS
  Filled 2012-04-10: qty 2

## 2012-04-10 MED ORDER — METRONIDAZOLE 500 MG PO TABS: 500.0000 mg | ORAL_TABLET | Freq: Two times a day (BID) | ORAL | Status: AC

## 2012-04-10 MED ORDER — ONDANSETRON 8 MG PO TBDP: 8.0000 mg | ORAL_TABLET | Freq: Three times a day (TID) | ORAL | Status: AC | PRN

## 2012-04-10 MED ORDER — ONDANSETRON HCL 4 MG/2ML IJ SOLN
INTRAMUSCULAR | Status: AC
  Filled 2012-04-10: qty 2

## 2012-04-10 MED ORDER — OXYCODONE-ACETAMINOPHEN 5-325 MG PO TABS: 1.0000 | ORAL_TABLET | Freq: Four times a day (QID) | ORAL | Status: AC | PRN

## 2012-04-10 MED ORDER — ONDANSETRON HCL 4 MG/2ML IJ SOLN
4.0000 mg | Freq: Once | INTRAMUSCULAR | Status: AC
  Administered 2012-04-10: 4 mg via INTRAVENOUS
  Filled 2012-04-10: qty 2

## 2012-04-10 MED ORDER — CIPROFLOXACIN HCL 500 MG PO TABS: 500.0000 mg | ORAL_TABLET | Freq: Two times a day (BID) | ORAL | Status: DC

## 2012-04-10 MED ORDER — IOHEXOL 300 MG/ML  SOLN
20.0000 mL | INTRAMUSCULAR | Status: AC
  Administered 2012-04-10 (×2): 20 mL via ORAL

## 2012-04-10 MED ORDER — IOHEXOL 300 MG/ML  SOLN
100.0000 mL | Freq: Once | INTRAMUSCULAR | Status: AC | PRN
  Administered 2012-04-10: 100 mL via INTRAVENOUS

## 2012-04-10 MED ORDER — SODIUM CHLORIDE 0.9 % IV BOLUS (SEPSIS)
1000.0000 mL | Freq: Once | INTRAVENOUS | Status: AC
  Administered 2012-04-10: 1000 mL via INTRAVENOUS

## 2012-04-10 NOTE — ED Provider Notes (Signed)
History     CSN: [id number]  Arrival date & time 04/10/12  1308   First MD Initiated Contact with Patient 04/10/12 1005      Chief Complaint  Patient presents with  . Diverticulitis  . Abdominal Pain    (Consider location/radiation/quality/duration/timing/severity/associated sxs/prior treatment) HPI  Past Medical History  Diagnosis Date  . CAD (coronary artery disease)     DES to the LAD July 06, 2011  / Relook catheter July 14, 2011 stent patent, moderate mid LAD stenosis but not high grade , plan medical therapy.  Last cath Sept 2012  . Hypertension   . Hyperlipidemia   . Borderline diabetes   . Morbid obesity   . Fibromyalgia   . Osteoarthritis   . Degenerative joint disease   . Groin hematoma     July 06 2011 treated with pressure  . Hematoma     Radial catheter site July 13, 2011  . Diabetes mellitus     Past Surgical History  Procedure Date  . Cardiac catheterization     revealing patency of the LAD stent with mild compromise of  the diagonal and moderate mid LAD stenosis, but does not appear to  be high grade.   . Cholecystectomy   . Tonsillectomy   . Carpal tunnel release   . Bunionectomy   . Knee arthroscopy   . Abdominal hysterectomy   . Angioplasty     Family History  Problem Relation Age of Onset  . Other Mother     died pulmonary issues  . Coronary artery disease Father 32    died- CABG-HTN- AAA    History  Substance Use Topics  . Smoking status: Never Smoker   . Smokeless tobacco: Never Used  . Alcohol Use: Yes     occasional    OB History    Grav Para Term Preterm Abortions TAB SAB Ect Mult Living                  Review of Systems  Allergies  Erythromycin; Indomethacin; Biaxin; and Penicillins  Home Medications   Current Outpatient Rx  Name Route Sig Dispense Refill  . AMLODIPINE BESYLATE 2.5 MG PO TABS Oral Take 2.5 mg by mouth daily.    . ASPIRIN EC 81 MG PO TBEC Oral Take 81 mg by mouth daily.    . ATORVASTATIN  CALCIUM 20 MG PO TABS Oral Take 1 tablet (20 mg total) by mouth daily.    Marland Kitchen BENAZEPRIL HCL 5 MG PO TABS Oral Take 5 mg by mouth daily.      Marland Kitchen CALCIUM CARBONATE 200 MG PO CAPS Oral Take 250 mg by mouth daily.      Marland Kitchen CLOPIDOGREL BISULFATE 75 MG PO TABS Oral Take 75 mg by mouth daily.     . CYCLOBENZAPRINE HCL 10 MG PO TABS Oral Take 10 mg by mouth 3 (three) times daily as needed. For back pain    . DULOXETINE HCL 30 MG PO CPEP Oral Take 90 mg by mouth daily.     . ERGOCALCIFEROL 50000 UNITS PO CAPS Oral Take 50,000 Units by mouth once a week. Take on Saturday    . ISOSORBIDE MONONITRATE ER 30 MG PO TB24 Oral Take 15 mg by mouth daily.      Marland Kitchen METOPROLOL SUCCINATE ER 25 MG PO TB24 Oral Take 25 mg by mouth daily.    Carma Leaven M PLUS PO TABS Oral Take 1 tablet by mouth daily.      Marland Kitchen  PANTOPRAZOLE SODIUM 40 MG PO TBEC Oral Take 1 tablet (40 mg total) by mouth daily at 12 noon. 30 tablet 3  . PREGABALIN 75 MG PO CAPS Oral Take 325 mg by mouth 2 (two) times daily.     Marland Kitchen RANOLAZINE ER 500 MG PO TB12 Oral Take 2 tablets (1,000 mg total) by mouth 2 (two) times daily. 360 tablet 2  . TRAMADOL HCL 50 MG PO TABS Oral Take 50 mg by mouth 3 (three) times daily as needed. For pain    . ALBUTEROL 90 MCG/ACT IN AERS Inhalation Inhale 1-2 puffs into the lungs every 4 (four) hours as needed. For wheezing    . NITROGLYCERIN 0.4 MG SL SUBL Sublingual Place 0.4 mg under the tongue every 5 (five) minutes as needed. For chest pain       BP 105/58  Pulse 72  Temp(Src) 98.4 F (36.9 C) (Oral)  Resp 20  SpO2 95%  Physical Exam  ED Course  Procedures (including critical care time)  Labs Reviewed  COMPREHENSIVE METABOLIC PANEL - Abnormal; Notable for the following:    Glucose, Bld 115 (*)    GFR calc non Af Amer 89 (*)    All other components within normal limits  URINALYSIS, ROUTINE W REFLEX MICROSCOPIC - Abnormal; Notable for the following:    APPearance CLOUDY (*)    Leukocytes, UA MODERATE (*)    All other  components within normal limits  URINE MICROSCOPIC-ADD ON - Abnormal; Notable for the following:    Squamous Epithelial / LPF MANY (*)    Bacteria, UA FEW (*)    All other components within normal limits  CBC  DIFFERENTIAL  LIPASE, BLOOD  URINE CULTURE   Ct Abdomen Pelvis W Contrast  04/10/2012  *RADIOLOGY REPORT*  Clinical Data: Left lower quadrant abdominal pain.  CT ABDOMEN AND PELVIS WITH CONTRAST  Technique:  Multidetector CT imaging of the abdomen and pelvis was performed following the standard protocol during bolus administration of intravenous contrast.  Contrast: OMNIPAQUE IOHEXOL 300 MG/ML  SOLN  Comparison: CT scan 02/07/2012.  Findings: The lung bases are clear and stable.  A hiatal hernia is again noted.  The liver demonstrates mild fatty infiltration.  No focal lesions or biliary dilatation.  The gallbladder is surgically absent.  No common bile duct dilatation.  The portal and hepatic veins are patent.  The pancreas is normal.  The spleen is normal in size.  No focal lesions.  The adrenal glands and kidneys are unremarkable and stable.  No renal or obstructing ureteral calculi.  The stomach, duodenum, small bowel and colon are normal except for descending and sigmoid diverticulosis and a small focus of sigmoid diverticulitis.  No abscess or free air.  The aorta is normal in caliber.  The major branch vessels are patent.  No atherosclerotic changes.  The uterus is surgically absent.  No pelvic mass or adenopathy.  The bladder is normal.  No inguinal mass or hernia.  The bony pelvis is intact.  IMPRESSION:  1.  Descending and sigmoid diverticulosis with a small focus of sigmoid diverticulitis.  No complicating features. 2.  Status post cholecystectomy and hysterectomy. 3.  Mild diffuse fatty infiltration of the liver.  Original Report Authenticated By: P. Loralie Champagne, M.D.   Dg Abd 2 Views  04/10/2012  *RADIOLOGY REPORT*  Clinical Data: Diverticulitis  ABDOMEN - 2 VIEW  Comparison:  02/07/2012  Findings: No free intraperitoneal gas.  Distended small bowel loops in the left upper quadrant  are noted.  Colonic gas is present. Postoperative changes.  Unremarkable soft tissues.  IMPRESSION: Focal dilated small bowel in the left upper quadrant.  Early or partial small bowel obstruction may be present.  Also consider, a focal inflammatory process.  Original Report Authenticated By: Donavan Burnet, M.D.     1. Diverticulitis     3:23 PM Handoff from Miguel Dibble. Patient to CDU from Pod A pending CT scan to r/o diverticuilits vs. SBO (demonstrated on plain films).   Vital signs reviewed and are as follows: Filed Vitals:   04/10/12 1118  BP: 105/58  Pulse: 72  Temp:   Resp: 20   4:21 PM Patient's BP has been in 70's - 80's systolic on machine. Patient has not been tachycardic. She has received 1L of fluid bolus. Patient states she has been lightheaded. Nurse checked BP manually and it was 100's systolic. D/w Dr. Ignacia Palma. She appears well.   CT scan reviewed by myself. Per radiologist there is focus of diverticulitis, no SBO. Will treat. Will ambulate.   Exam:  Gen NAD; Heart RRR, nml S1,S2, no m/r/g; Lungs CTAB; Abd obese, soft, LLQ tenderness to palp, no rebound or guarding; Ext 2+ pedal pulses bilaterally, no edema.  5:17 PM Patient informed of results. Patient has ambulated. She states she feels well walking. She is tolerating PO's. She is requesting and is comfortable with discharge to home.   Urged PCP follow-up in 2 days for recheck.  Patient counseled on use of narcotic pain medications. Counseled not to combine these medications with others containing tylenol. Urged not to drink alcohol, drive, or perform any other activities that requires focus while taking these medications. The patient verbalizes understanding and agrees with the plan.  The patient was urged to return to the Emergency Department immediately with worsening of current symptoms, worsening abdominal  pain, persistent vomiting, blood noted in stools, fever, or any other concerns. The patient verbalized understanding.    MDM  Diverticulitis -- CT confirms. Patient tolerating PO's. She appears well.         Renne Crigler, [place] 04/10/12 1720

## 2012-04-10 NOTE — ED Provider Notes (Signed)
History     CSN: [id number]  Arrival date & time 04/10/12  6578   First MD Initiated Contact with Patient 04/10/12 1005      10:17 AM HPI Patient reports left lower quadrant pain that began several days ago. Reports this morning pain worsens. Reports a history of diverticulitis and states pain feels similar to prior diverticulitis. Reports several days ago also had abnormal diarrhea. Symptoms associated with nausea. Denies fever, back pain, dysuria, hematuria, vaginal discharge. Patient reports normal belching and flatulence.  Patient is a 61 y.o. female presenting with abdominal pain. The history is provided by the patient.  Abdominal Pain The primary symptoms of the illness include abdominal pain, nausea and diarrhea. The primary symptoms of the illness do not include fever, shortness of breath, vomiting, hematemesis, hematochezia, dysuria or vaginal discharge. The current episode started 2 days ago. The onset of the illness was gradual. The problem has been gradually worsening.  The abdominal pain is located in the LLQ. The abdominal pain does not radiate. The abdominal pain is relieved by nothing. Exacerbated by: palpation.  The patient states that she believes she is currently not pregnant. The patient has not had a change in bowel habit. Symptoms associated with the illness do not include chills, anorexia, diaphoresis, heartburn, constipation, urgency, hematuria, frequency or back pain. Significant associated medical issues include diverticulitis.    Past Medical History  Diagnosis Date  . CAD (coronary artery disease)     DES to the LAD July 06, 2011  / Relook catheter July 14, 2011 stent patent, moderate mid LAD stenosis but not high grade , plan medical therapy.  Last cath Sept 2012  . Hypertension   . Hyperlipidemia   . Borderline diabetes   . Morbid obesity   . Fibromyalgia   . Osteoarthritis   . Degenerative joint disease   . Groin hematoma     July 06 2011 treated with  pressure  . Hematoma     Radial catheter site July 13, 2011  . Diabetes mellitus     Past Surgical History  Procedure Date  . Cardiac catheterization     revealing patency of the LAD stent with mild compromise of  the diagonal and moderate mid LAD stenosis, but does not appear to  be high grade.   . Cholecystectomy   . Tonsillectomy   . Carpal tunnel release   . Bunionectomy   . Knee arthroscopy   . Abdominal hysterectomy   . Angioplasty     Family History  Problem Relation Age of Onset  . Other Mother     died pulmonary issues  . Coronary artery disease Father 13    died- CABG-HTN- AAA    History  Substance Use Topics  . Smoking status: Never Smoker   . Smokeless tobacco: Never Used  . Alcohol Use: Yes     occasional    OB History    Grav Para Term Preterm Abortions TAB SAB Ect Mult Living                  Review of Systems  Constitutional: Negative for fever, chills and diaphoresis.  Respiratory: Negative for shortness of breath.   Cardiovascular: Negative for chest pain.  Gastrointestinal: Positive for nausea, abdominal pain and diarrhea. Negative for heartburn, vomiting, constipation, hematochezia, anorexia and hematemesis.  Genitourinary: Negative for dysuria, urgency, frequency, hematuria, flank pain, vaginal discharge and vaginal pain.  Musculoskeletal: Negative for back pain.  All other systems reviewed and are negative.  Allergies  Erythromycin; Indomethacin; Biaxin; and Penicillins  Home Medications   Current Outpatient Rx  Name Route Sig Dispense Refill  . AMLODIPINE BESYLATE 2.5 MG PO TABS Oral Take 2.5 mg by mouth daily.    . ASPIRIN EC 81 MG PO TBEC Oral Take 81 mg by mouth daily.    . ATORVASTATIN CALCIUM 20 MG PO TABS Oral Take 1 tablet (20 mg total) by mouth daily.    Marland Kitchen BENAZEPRIL HCL 5 MG PO TABS Oral Take 5 mg by mouth daily.      Marland Kitchen CALCIUM CARBONATE 200 MG PO CAPS Oral Take 250 mg by mouth daily.      Marland Kitchen CLOPIDOGREL BISULFATE 75 MG  PO TABS Oral Take 75 mg by mouth daily.     . CYCLOBENZAPRINE HCL 10 MG PO TABS Oral Take 10 mg by mouth 3 (three) times daily as needed. For back pain    . DULOXETINE HCL 30 MG PO CPEP Oral Take 90 mg by mouth daily.     . ERGOCALCIFEROL 50000 UNITS PO CAPS Oral Take 50,000 Units by mouth once a week. Take on Saturday    . ISOSORBIDE MONONITRATE ER 30 MG PO TB24 Oral Take 15 mg by mouth daily.      Marland Kitchen METOPROLOL SUCCINATE ER 25 MG PO TB24 Oral Take 25 mg by mouth daily.    Carma Leaven M PLUS PO TABS Oral Take 1 tablet by mouth daily.      Marland Kitchen PANTOPRAZOLE SODIUM 40 MG PO TBEC Oral Take 1 tablet (40 mg total) by mouth daily at 12 noon. 30 tablet 3  . PREGABALIN 75 MG PO CAPS Oral Take 325 mg by mouth 2 (two) times daily.     Marland Kitchen RANOLAZINE ER 500 MG PO TB12 Oral Take 2 tablets (1,000 mg total) by mouth 2 (two) times daily. 360 tablet 2  . TRAMADOL HCL 50 MG PO TABS Oral Take 50 mg by mouth 3 (three) times daily as needed. For pain    . ALBUTEROL 90 MCG/ACT IN AERS Inhalation Inhale 1-2 puffs into the lungs every 4 (four) hours as needed. For wheezing    . NITROGLYCERIN 0.4 MG SL SUBL Sublingual Place 0.4 mg under the tongue every 5 (five) minutes as needed. For chest pain       BP 124/63  Pulse 93  Temp(Src) 98.4 F (36.9 C) (Oral)  Resp 20  SpO2 95%  Physical Exam  Vitals reviewed. Constitutional: She is oriented to person, place, and time. Vital signs are normal. She appears well-developed and well-nourished.  HENT:  Head: Normocephalic and atraumatic.  Eyes: Conjunctivae are normal. Pupils are equal, round, and reactive to light.  Neck: Normal range of motion. Neck supple.  Cardiovascular: Normal rate, regular rhythm and normal heart sounds.  Exam reveals no friction rub.   No murmur heard. Pulmonary/Chest: Effort normal and breath sounds normal. She has no wheezes. She has no rhonchi. She has no rales. She exhibits no tenderness.  Abdominal: There is no hepatosplenomegaly. There is  tenderness in the left upper quadrant and left lower quadrant. There is no rigidity, no rebound, no guarding, no CVA tenderness, no tenderness at McBurney's point and negative Murphy's sign.  Musculoskeletal: Normal range of motion.  Neurological: She is alert and oriented to person, place, and time. Coordination normal.  Skin: Skin is warm and dry. No rash noted. No erythema. No pallor.    ED Course  Procedures  Results for orders placed during the hospital encounter  of 04/10/12  CBC      Component Value Range   WBC 6.1  4.0 - 10.5 (K/uL)   RBC 4.50  3.87 - 5.11 (MIL/uL)   Hemoglobin 13.5  12.0 - 15.0 (g/dL)   HCT 21.3  08.6 - 57.8 (%)   MCV 86.7  78.0 - 100.0 (fL)   MCH 30.0  26.0 - 34.0 (pg)   MCHC 34.6  30.0 - 36.0 (g/dL)   RDW 46.9  62.9 - 52.8 (%)   Platelets 162  150 - 400 (K/uL)  DIFFERENTIAL      Component Value Range   Neutrophils Relative 69  43 - 77 (%)   Neutro Abs 4.2  1.7 - 7.7 (K/uL)   Lymphocytes Relative 22  12 - 46 (%)   Lymphs Abs 1.4  0.7 - 4.0 (K/uL)   Monocytes Relative 7  3 - 12 (%)   Monocytes Absolute 0.4  0.1 - 1.0 (K/uL)   Eosinophils Relative 2  0 - 5 (%)   Eosinophils Absolute 0.1  0.0 - 0.7 (K/uL)   Basophils Relative 0  0 - 1 (%)   Basophils Absolute 0.0  0.0 - 0.1 (K/uL)  COMPREHENSIVE METABOLIC PANEL      Component Value Range   Sodium 140  135 - 145 (mEq/L)   Potassium 3.8  3.5 - 5.1 (mEq/L)   Chloride 104  96 - 112 (mEq/L)   CO2 26  19 - 32 (mEq/L)   Glucose, Bld 115 (*) 70 - 99 (mg/dL)   BUN 18  6 - 23 (mg/dL)   Creatinine, Ser 4.13  0.50 - 1.10 (mg/dL)   Calcium 9.5  8.4 - 24.4 (mg/dL)   Total Protein 6.8  6.0 - 8.3 (g/dL)   Albumin 3.8  3.5 - 5.2 (g/dL)   AST 16  0 - 37 (U/L)   ALT 15  0 - 35 (U/L)   Alkaline Phosphatase 72  39 - 117 (U/L)   Total Bilirubin 0.9  0.3 - 1.2 (mg/dL)   GFR calc non Af Amer 89 (*) >90 (mL/min)   GFR calc Af Amer >90  >90 (mL/min)  LIPASE, BLOOD      Component Value Range   Lipase 26  11 - 59 (U/L)     Dg Abd 2 Views  04/10/2012  *RADIOLOGY REPORT*  Clinical Data: Diverticulitis  ABDOMEN - 2 VIEW  Comparison: 02/07/2012  Findings: No free intraperitoneal gas.  Distended small bowel loops in the left upper quadrant are noted.  Colonic gas is present. Postoperative changes.  Unremarkable soft tissues.  IMPRESSION: Focal dilated small bowel in the left upper quadrant.  Early or partial small bowel obstruction may be present.  Also consider, a focal inflammatory process.  Original Report Authenticated By: Donavan Burnet, M.D.     MDM  11:47 AM  Abnormal x-ray that indicates possible small bowel obstruction is inflammatory process. Will order CT of the abdomen and pelvis. Discussed this with patient and family who agree with plan. Reports improvement in pain after 2 doses of fentanyl and 2 doses of Zofran. Advised patient we will continue to manage pain while waiting for CT scan. To CDU pending CT of the abdomen. Discussed plan with Dr. Ignacia Palma and Orson Eva PA-C     Thomasene Lot, PA-C 04/10/12 1202

## 2012-04-10 NOTE — ED Notes (Signed)
Pt sts hx of diverticulitis and sts having LLQ pain that was the same as previous episodes with rectal pressure and nausea; pt sts some diarrhea on Tuesday but not since

## 2012-04-10 NOTE — ED Notes (Signed)
Pt fluids increased due to blood pressure of 83/41. Melvenia Beam, PA notified.

## 2012-04-10 NOTE — ED Provider Notes (Signed)
Medical screening examination/treatment/procedure(s) were performed by non-physician practitioner and as supervising physician I was immediately available for consultation/collaboration.   Carleene Cooper III, MD 04/10/12 2005

## 2012-04-10 NOTE — ED Notes (Signed)
Pt given drink to see if she is able to tolerate PO fluids

## 2012-04-10 NOTE — ED Notes (Signed)
Pt transported to and from radiology on stretcher with tech and tolerated well. 

## 2012-04-10 NOTE — Discharge Instructions (Signed)
Please read and follow all provided instructions.  Your diagnoses today include:  1. Diverticulitis     Tests performed today include:  Blood counts and electrolytes  Blood tests to check liver and kidney function  Blood tests to check pancreas function  Urine test to look for infection  CT scan which confirmed diverticulitis  Vital signs. See below for your results today.   Medications prescribed:   Percocet (oxycodone/acetaminophen) - narcotic pain medication  You have been prescribed narcotic pain medication such as Vicodin or Percocet: DO NOT drive or perform any activities that require you to be awake and alert because this medicine can make you drowsy. BE VERY CAREFUL not to take multiple medicines containing Tylenol (also called acetaminophen). Doing so can lead to an overdose which can damage your liver and cause liver failure and possibly death.    Zofran (ondansetron) - for nausea and vomiting   Cipro - antibiotic for diverticulitis  Flagyl - antibiotic for diverticulitis  Take any prescribed medications only as directed.  Home care instructions:   Follow any educational materials contained in this packet.  Follow-up instructions: Please follow-up with your primary care provider in the next 2 days for further evaluation of your symptoms. If you do not have a primary care doctor -- see below for referral information.   Return instructions:  SEEK IMMEDIATE MEDICAL ATTENTION IF:  The pain does not go away or becomes severe   A temperature above 101F develops   Repeated vomiting occurs (multiple episodes)   The pain becomes localized to portions of the abdomen. The right side could possibly be appendicitis. In an adult, the left lower portion of the abdomen could be colitis or diverticulitis.   Blood is being passed in stools or vomit (bright red or black tarry stools)   You develop chest pain, difficulty breathing, dizziness or fainting, or become  confused, poorly responsive, or inconsolable (young children)  If you have any other emergent concerns regarding your health  Additional Information: Abdominal (belly) pain can be caused by many things. Your caregiver performed an examination and possibly ordered blood/urine tests and imaging (CT scan, x-rays, ultrasound). Many cases can be observed and treated at home after initial evaluation in the emergency department. Even though you are being discharged home, abdominal pain can be unpredictable. Therefore, you need a repeated exam if your pain does not resolve, returns, or worsens. Most patients with abdominal pain don't have to be admitted to the hospital or have surgery, but serious problems like appendicitis and gallbladder attacks can start out as nonspecific pain. Many abdominal conditions cannot be diagnosed in one visit, so follow-up evaluations are very important.  Your vital signs today were: BP 98/59  Pulse 75  Temp(Src) 98.6 F (37 C) (Oral)  Resp 20  SpO2 100% If your blood pressure (bp) was elevated above 135/85 this visit, please have this repeated by your doctor within one month. -------------- No Primary Care Doctor Call [facility]  [phone] Other agencies that provide inexpensive medical care    [facility]  [phone]    [facility]  [phone]    [facility]  [phone]    [facility]  [phone]    [facility]  [phone]    [facility]  [phone] -------------- RESOURCE GUIDE:  Dental Problems  Patients with Medicaid: [facility]  [facility] [address]                                            [phone] [address] Phone:  [phone]                                                      Phone:  [phone]  If unable to pay or uninsured, contact:  [facility] or [facility] to become qualified for the adult dental clinic.  Chronic Pain  Problems Contact [facility]  [phone] Patients need to be referred by their primary care doctor.  Insufficient Money for Medicine Contact [employer]:  call "211" or [facility] [phone].  Psychological Services [facility]  [phone] [facility]  [phone] [facility]   [phone] (emergency services [phone])  Substance Abuse Resources [facility]  [phone] [facility] [phone] [facility] [phone] Floydene Flock [phone] Residential & Outpatient Substance Abuse Program  [phone]  Abuse/Neglect [facility] Child Abuse Hotline [phone] [facility] Child Abuse Hotline [phone] (After Hours)  Emergency Shelter [facility] [phone]  Maternity Homes [facility] [place] [facility] [phone] [place] [facility] [phone]  [facility] Resources  [facility]     [employer]                          [facility] [address] [place]                       [address]      [address]  Blondell Reveal Phone:  [phone]                                   Phone:  [phone]                 Phone:  [phone]  [facility] Phone:  [phone]  [facility] Child Abuse Hotline [phone] [phone] (After Hours)

## 2012-04-10 NOTE — ED Provider Notes (Signed)
Medical screening examination/treatment/procedure(s) were performed by non-physician practitioner and as supervising physician I was immediately available for consultation/collaboration.   Carleene Cooper III, MD 04/10/12 2004

## 2012-04-10 NOTE — ED Notes (Signed)
Pt returned from CT °

## 2012-04-11 LAB — URINE CULTURE
Colony Count: NO GROWTH
Culture  Setup Time: 201305021244
Culture: NO GROWTH

## 2012-05-07 ENCOUNTER — Encounter: Payer: Self-pay | Admitting: Cardiology

## 2012-05-08 ENCOUNTER — Encounter: Payer: Self-pay | Admitting: Cardiology

## 2012-05-08 ENCOUNTER — Ambulatory Visit (INDEPENDENT_AMBULATORY_CARE_PROVIDER_SITE_OTHER): Payer: 59 | Admitting: Cardiology

## 2012-05-08 VITALS — BP 126/66 | HR 92 | Resp 12 | Ht 63.0 in | Wt 226.8 lb

## 2012-05-08 DIAGNOSIS — I2510 Atherosclerotic heart disease of native coronary artery without angina pectoris: Secondary | ICD-10-CM

## 2012-05-08 DIAGNOSIS — I10 Essential (primary) hypertension: Secondary | ICD-10-CM

## 2012-05-08 NOTE — Assessment & Plan Note (Signed)
Coronary disease is stable. We'll continue the same medications. No change in therapy. No further workup at this time.

## 2012-05-08 NOTE — Patient Instructions (Signed)
Your physician wants you to follow-up in:  6 months. You will receive a reminder letter in the mail two months in advance. If you don't receive a letter, please call our office to schedule the follow-up appointment.   

## 2012-05-08 NOTE — Assessment & Plan Note (Signed)
Blood pressures control. No change in therapy. 

## 2012-05-08 NOTE — Progress Notes (Signed)
HPI Patient is seen today to followup coronary artery disease. It's been a difficult year for her but she is doing better now. I saw her last in December, 2012. She then had more symptoms and underwent a nuclear stress study on March 04, 2012. There was no scar or ischemia. She is actually done well since then. She has not had to use any nitroglycerin on a regular basis.   Allergies  Allergen Reactions  . Erythromycin Diarrhea  . Indomethacin Diarrhea  . Biaxin (Clarithromycin) Rash  . Penicillins Rash    Current Outpatient Prescriptions  Medication Sig Dispense Refill  . albuterol (PROVENTIL,VENTOLIN) 90 MCG/ACT inhaler Inhale 1-2 puffs into the lungs every 4 (four) hours as needed. For wheezing      . amLODipine (NORVASC) 2.5 MG tablet Take 2.5 mg by mouth daily.      Marland Kitchen aspirin EC 81 MG tablet Take 81 mg by mouth daily.      Marland Kitchen atorvastatin (LIPITOR) 20 MG tablet Take 1 tablet (20 mg total) by mouth daily.      . benazepril (LOTENSIN) 5 MG tablet Take 5 mg by mouth daily.        . calcium carbonate 200 MG capsule Take 250 mg by mouth daily.        . clopidogrel (PLAVIX) 75 MG tablet Take 75 mg by mouth daily.       . cyclobenzaprine (FLEXERIL) 10 MG tablet Take 10 mg by mouth 3 (three) times daily as needed. For back pain      . DULoxetine (CYMBALTA) 30 MG capsule Take 90 mg by mouth daily.       . ergocalciferol (VITAMIN D2) 50000 UNITS capsule Take 50,000 Units by mouth once a week. Take on Saturday      . isosorbide mononitrate (IMDUR) 30 MG 24 hr tablet Take 15 mg by mouth daily.        . metoprolol succinate (TOPROL-XL) 25 MG 24 hr tablet Take 25 mg by mouth daily.      . Multiple Vitamins-Minerals (MULTIVITAMINS THER. W/MINERALS) TABS Take 1 tablet by mouth daily.        . nitroGLYCERIN (NITROSTAT) 0.4 MG SL tablet Place 0.4 mg under the tongue every 5 (five) minutes as needed. For chest pain       . pantoprazole (PROTONIX) 40 MG tablet Take 1 tablet (40 mg total) by mouth  daily at 12 noon.  30 tablet  3  . pregabalin (LYRICA) 75 MG capsule Take 325 mg by mouth 2 (two) times daily.       . ranolazine (RANEXA) 500 MG 12 hr tablet Take 2 tablets (1,000 mg total) by mouth 2 (two) times daily.  360 tablet  2  . traMADol (ULTRAM) 50 MG tablet Take 50 mg by mouth 3 (three) times daily as needed. For pain        History   Social History  . Marital Status: Married    Spouse Name: N/A    Number of Children: 2  . Years of Education: N/A   Occupational History  . URGENT C[employer]   Social History Main Topics  . Smoking status: Never Smoker   . Smokeless tobacco: Never Used  . Alcohol Use: Yes     occasional  . Drug Use: No  . Sexually Active: Not on file   Other Topics Concern  . Not on file   Social History Narrative  . No narrative on file    Family  History  Problem Relation Age of Onset  . Other Mother     died pulmonary issues  . Coronary artery disease Father 43    died- CABG-HTN- AAA    Past Medical History  Diagnosis Date  . CAD (coronary artery disease)     DES to the LAD July 06, 2011  / Relook catheter July 14, 2011 stent patent, moderate mid LAD stenosis but not high grade , plan medical therapy.  Last cath Sept 2012  . Hypertension   . Hyperlipidemia   . Borderline diabetes   . Morbid obesity   . Fibromyalgia   . Osteoarthritis   . Degenerative joint disease   . Groin hematoma     July 06 2011 treated with pressure  . Hematoma     Radial catheter site July 13, 2011  . Diabetes mellitus     Past Surgical History  Procedure Date  . Cardiac catheterization     revealing patency of the LAD stent with mild compromise of  the diagonal and moderate mid LAD stenosis, but does not appear to  be high grade.   . Cholecystectomy   . Tonsillectomy   . Carpal tunnel release   . Bunionectomy   . Knee arthroscopy   . Abdominal hysterectomy   . Angioplasty     ROS Patient denies fever, chills, headache, sweats, rash,  change in vision, change in hearing, chest pain, cough, nausea vomiting, urinary symptoms. All other systems are reviewed and are negative.  PHYSICAL EXAM Patient stable today. She is oriented to person time and place. Affect is normal. There is no jugulovenous distention. Lungs are clear. Respiratory effort is nonlabored. Cardiac exam reveals S1 and S2. There no clicks or significant murmurs. The abdomen is soft. There's no peripheral edema.  Filed Vitals:   05/08/12 1100  BP: 126/66  Pulse: 92  Resp: 12  Height: 5\' 3"  (1.6 m)  Weight: 226 lb 12.8 oz (102.876 kg)     ASSESSMENT & PLAN

## 2012-06-30 ENCOUNTER — Other Ambulatory Visit: Payer: Self-pay | Admitting: Physician Assistant

## 2012-06-30 ENCOUNTER — Other Ambulatory Visit (HOSPITAL_COMMUNITY): Payer: Self-pay | Admitting: Nurse Practitioner

## 2012-07-18 ENCOUNTER — Other Ambulatory Visit: Payer: Self-pay

## 2012-07-18 DIAGNOSIS — I2510 Atherosclerotic heart disease of native coronary artery without angina pectoris: Secondary | ICD-10-CM

## 2012-07-18 MED ORDER — RANOLAZINE ER 500 MG PO TB12: 1000.0000 mg | ORAL_TABLET | Freq: Two times a day (BID) | ORAL | Status: DC

## 2012-08-13 ENCOUNTER — Other Ambulatory Visit: Payer: Self-pay | Admitting: Family Medicine

## 2012-08-13 DIAGNOSIS — Z1231 Encounter for screening mammogram for malignant neoplasm of breast: Secondary | ICD-10-CM

## 2012-08-19 ENCOUNTER — Other Ambulatory Visit: Payer: Self-pay | Admitting: Cardiology

## 2012-08-19 MED ORDER — METOPROLOL SUCCINATE ER 25 MG PO TB24: 25.0000 mg | ORAL_TABLET | Freq: Every day | ORAL | Status: DC

## 2012-08-29 ENCOUNTER — Ambulatory Visit
Admission: RE | Admit: 2012-08-29 | Discharge: 2012-08-29 | Disposition: A | Discharge status: Home / Self Care | Payer: 59 | Source: Ambulatory Visit | Attending: Family Medicine | Admitting: Family Medicine

## 2012-08-29 DIAGNOSIS — Z1231 Encounter for screening mammogram for malignant neoplasm of breast: Secondary | ICD-10-CM

## 2012-09-22 ENCOUNTER — Other Ambulatory Visit: Payer: Self-pay | Admitting: Cardiology

## 2012-11-17 ENCOUNTER — Other Ambulatory Visit: Payer: Self-pay

## 2012-11-17 MED ORDER — AMLODIPINE BESYLATE 2.5 MG PO TABS: 2.5000 mg | ORAL_TABLET | Freq: Every day | ORAL | Status: DC

## 2012-11-22 ENCOUNTER — Emergency Department (HOSPITAL_COMMUNITY)
Admission: EM | Admit: 2012-11-22 | Discharge: 2012-11-22 | Disposition: A | Discharge status: Home / Self Care | Payer: 59 | Attending: Emergency Medicine | Admitting: Emergency Medicine

## 2012-11-22 ENCOUNTER — Encounter (HOSPITAL_COMMUNITY): Payer: Self-pay | Admitting: Physical Medicine and Rehabilitation

## 2012-11-22 ENCOUNTER — Emergency Department (HOSPITAL_COMMUNITY): Payer: 59

## 2012-11-22 DIAGNOSIS — IMO0002 Reserved for concepts with insufficient information to code with codable children: Secondary | ICD-10-CM | POA: Insufficient documentation

## 2012-11-22 DIAGNOSIS — K5732 Diverticulitis of large intestine without perforation or abscess without bleeding: Secondary | ICD-10-CM | POA: Insufficient documentation

## 2012-11-22 DIAGNOSIS — M1990 Unspecified osteoarthritis, unspecified site: Secondary | ICD-10-CM | POA: Insufficient documentation

## 2012-11-22 DIAGNOSIS — R110 Nausea: Secondary | ICD-10-CM | POA: Insufficient documentation

## 2012-11-22 DIAGNOSIS — K5792 Diverticulitis of intestine, part unspecified, without perforation or abscess without bleeding: Secondary | ICD-10-CM

## 2012-11-22 DIAGNOSIS — Z9861 Coronary angioplasty status: Secondary | ICD-10-CM | POA: Insufficient documentation

## 2012-11-22 DIAGNOSIS — E785 Hyperlipidemia, unspecified: Secondary | ICD-10-CM | POA: Insufficient documentation

## 2012-11-22 DIAGNOSIS — Z90710 Acquired absence of both cervix and uterus: Secondary | ICD-10-CM | POA: Insufficient documentation

## 2012-11-22 DIAGNOSIS — I2510 Atherosclerotic heart disease of native coronary artery without angina pectoris: Secondary | ICD-10-CM | POA: Insufficient documentation

## 2012-11-22 DIAGNOSIS — Z7982 Long term (current) use of aspirin: Secondary | ICD-10-CM | POA: Insufficient documentation

## 2012-11-22 DIAGNOSIS — I10 Essential (primary) hypertension: Secondary | ICD-10-CM | POA: Insufficient documentation

## 2012-11-22 DIAGNOSIS — Z7902 Long term (current) use of antithrombotics/antiplatelets: Secondary | ICD-10-CM | POA: Insufficient documentation

## 2012-11-22 DIAGNOSIS — E119 Type 2 diabetes mellitus without complications: Secondary | ICD-10-CM | POA: Insufficient documentation

## 2012-11-22 DIAGNOSIS — Z8719 Personal history of other diseases of the digestive system: Secondary | ICD-10-CM | POA: Insufficient documentation

## 2012-11-22 DIAGNOSIS — Z79899 Other long term (current) drug therapy: Secondary | ICD-10-CM | POA: Insufficient documentation

## 2012-11-22 DIAGNOSIS — E669 Obesity, unspecified: Secondary | ICD-10-CM | POA: Insufficient documentation

## 2012-11-22 LAB — URINE MICROSCOPIC-ADD ON

## 2012-11-22 LAB — CBC WITH DIFFERENTIAL/PLATELET
Basophils Absolute: 0 10*3/uL (ref 0.0–0.1)
Basophils Relative: 0 % (ref 0–1)
Lymphocytes Relative: 32 % (ref 12–46)
MCHC: 33.3 g/dL (ref 30.0–36.0)
Monocytes Absolute: 0.5 10*3/uL (ref 0.1–1.0)
Neutro Abs: 3.6 10*3/uL (ref 1.7–7.7)
Neutrophils Relative %: 58 % (ref 43–77)
Platelets: 171 10*3/uL (ref 150–400)
RDW: 14.2 % (ref 11.5–15.5)
WBC: 6.2 10*3/uL (ref 4.0–10.5)

## 2012-11-22 LAB — COMPREHENSIVE METABOLIC PANEL
ALT: 15 U/L (ref 0–35)
AST: 17 U/L (ref 0–37)
Albumin: 3.6 g/dL (ref 3.5–5.2)
CO2: 26 mEq/L (ref 19–32)
Chloride: 104 mEq/L (ref 96–112)
Creatinine, Ser: 0.65 mg/dL (ref 0.50–1.10)
Potassium: 3.6 mEq/L (ref 3.5–5.1)
Sodium: 140 mEq/L (ref 135–145)
Total Bilirubin: 0.6 mg/dL (ref 0.3–1.2)

## 2012-11-22 LAB — URINALYSIS, ROUTINE W REFLEX MICROSCOPIC
Bilirubin Urine: NEGATIVE
Glucose, UA: NEGATIVE mg/dL
Hgb urine dipstick: NEGATIVE
Protein, ur: NEGATIVE mg/dL

## 2012-11-22 MED ORDER — SODIUM CHLORIDE 0.9 % IV BOLUS (SEPSIS)
1000.0000 mL | Freq: Once | INTRAVENOUS | Status: AC
  Administered 2012-11-22: 1000 mL via INTRAVENOUS

## 2012-11-22 MED ORDER — CIPROFLOXACIN HCL 500 MG PO TABS
500.0000 mg | ORAL_TABLET | Freq: Two times a day (BID) | ORAL | Status: DC
  Administered 2012-11-22: 500 mg via ORAL
  Filled 2012-11-22: qty 1

## 2012-11-22 MED ORDER — MORPHINE SULFATE 4 MG/ML IJ SOLN
4.0000 mg | INTRAMUSCULAR | Status: DC | PRN
  Administered 2012-11-22: 4 mg via INTRAVENOUS
  Filled 2012-11-22: qty 1

## 2012-11-22 MED ORDER — METRONIDAZOLE 500 MG PO TABS: 500.0000 mg | ORAL_TABLET | Freq: Three times a day (TID) | ORAL | Status: DC

## 2012-11-22 MED ORDER — IOHEXOL 300 MG/ML  SOLN
20.0000 mL | INTRAMUSCULAR | Status: AC
  Administered 2012-11-22: 20 mL via ORAL

## 2012-11-22 MED ORDER — ONDANSETRON HCL 4 MG/2ML IJ SOLN
4.0000 mg | Freq: Once | INTRAMUSCULAR | Status: AC
  Administered 2012-11-22: 4 mg via INTRAVENOUS
  Filled 2012-11-22: qty 2

## 2012-11-22 MED ORDER — METRONIDAZOLE 500 MG PO TABS
500.0000 mg | ORAL_TABLET | Freq: Once | ORAL | Status: AC
  Administered 2012-11-22: 500 mg via ORAL
  Filled 2012-11-22: qty 1

## 2012-11-22 MED ORDER — IOHEXOL 300 MG/ML  SOLN
100.0000 mL | Freq: Once | INTRAMUSCULAR | Status: AC | PRN
  Administered 2012-11-22: 100 mL via INTRAVENOUS

## 2012-11-22 MED ORDER — OXYCODONE-ACETAMINOPHEN 5-325 MG PO TABS
2.0000 | ORAL_TABLET | Freq: Once | ORAL | Status: AC
  Administered 2012-11-22: 2 via ORAL
  Filled 2012-11-22: qty 2

## 2012-11-22 MED ORDER — CIPROFLOXACIN HCL 500 MG PO TABS: 500.0000 mg | ORAL_TABLET | Freq: Two times a day (BID) | ORAL | Status: DC

## 2012-11-22 MED ORDER — OXYCODONE-ACETAMINOPHEN 5-325 MG PO TABS: ORAL_TABLET | ORAL | Status: DC

## 2012-11-22 NOTE — ED Notes (Signed)
Pt presents to department for evaluation of L sided flank pain. Onset this morning @ 9:00am. 8/10 dull constant pain. States history of divertulosis, but this pain feels different. Also states severe nausea, but no vomiting. Denies urinary symptoms. Denies fever. She is conscious alert and oriented x4.

## 2012-11-22 NOTE — ED Provider Notes (Signed)
History     CSN: [id number]  Arrival date & time 11/22/12  1600   First MD Initiated Contact with Patient 11/22/12 1626      Chief Complaint  Patient presents with  . Flank Pain    (Consider location/radiation/quality/duration/timing/severity/associated sxs/prior treatment) HPI   Courtney Steele is a 61 y.o. female complaining of Dull pulsing  6/10 pain in the left lower and left upper quadrant. Pain is 9/10 at worst associated with nausea. Denies change in bowel or bladder bahits, fever, no h/o kidney stones. HTN w/no h/o smoking. Patient has history of diverticulitis but states that this feels different.  Past Medical History  Diagnosis Date  . CAD (coronary artery disease)     DES to the LAD July 06, 2011  / Relook catheter July 14, 2011 stent patent, moderate mid LAD stenosis but not high grade , plan medical therapy.  Last cath Sept 2012  . Hypertension   . Hyperlipidemia   . Borderline diabetes   . Morbid obesity   . Fibromyalgia   . Osteoarthritis   . Degenerative joint disease   . Groin hematoma     July 06 2011 treated with pressure  . Hematoma     Radial catheter site July 13, 2011  . Diabetes mellitus     Past Surgical History  Procedure Date  . Cardiac catheterization     revealing patency of the LAD stent with mild compromise of  the diagonal and moderate mid LAD stenosis, but does not appear to  be high grade.   . Cholecystectomy   . Tonsillectomy   . Carpal tunnel release   . Bunionectomy   . Knee arthroscopy   . Abdominal hysterectomy   . Angioplasty     Family History  Problem Relation Age of Onset  . Other Mother     died pulmonary issues  . Coronary artery disease Father 24    died- CABG-HTN- AAA    History  Substance Use Topics  . Smoking status: Never Smoker   . Smokeless tobacco: Never Used  . Alcohol Use: Yes     Comment: occasional    OB History    Grav Para Term Preterm Abortions TAB SAB Ect Mult Living                   Review of Systems  Constitutional: Negative for fever.  Respiratory: Negative for shortness of breath.   Cardiovascular: Negative for chest pain.  Gastrointestinal: Positive for abdominal pain. Negative for nausea, vomiting and diarrhea.  All other systems reviewed and are negative.    Allergies  Erythromycin; Indomethacin; Biaxin; and Penicillins  Home Medications   Current Outpatient Rx  Name  Route  Sig  Dispense  Refill  . ALBUTEROL 90 MCG/ACT IN AERS   Inhalation   Inhale 1-2 puffs into the lungs every 4 (four) hours as needed. For wheezing         . AMLODIPINE BESYLATE 2.5 MG PO TABS   Oral   Take 1 tablet (2.5 mg total) by mouth daily.   90 tablet   1   . ASPIRIN EC 81 MG PO TBEC   Oral   Take 81 mg by mouth daily.         . ATORVASTATIN CALCIUM 20 MG PO TABS   Oral   Take 1 tablet (20 mg total) by mouth daily.         Marland Kitchen BENAZEPRIL HCL 5 MG PO TABS  Oral   Take 5 mg by mouth daily.           Marland Kitchen CALCIUM CARBONATE 200 MG PO CAPS   Oral   Take 250 mg by mouth daily.           Marland Kitchen CLOPIDOGREL BISULFATE 75 MG PO TABS      TAKE 1 TABLET BY MOUTH EVERY DAY WITH A MEAL   30 tablet   10   . CYCLOBENZAPRINE HCL 10 MG PO TABS   Oral   Take 10 mg by mouth 3 (three) times daily as needed. For back pain         . DULOXETINE HCL 30 MG PO CPEP   Oral   Take 90 mg by mouth daily.          . ERGOCALCIFEROL 50000 UNITS PO CAPS   Oral   Take 50,000 Units by mouth once a week. Take on Saturday         . ISOSORBIDE MONONITRATE ER 30 MG PO TB24   Oral   Take 15 mg by mouth daily.           . ISOSORBIDE MONONITRATE ER 30 MG PO TB24      TAKE 1 TABLET (30 MG TOTAL) BY MOUTH DAILY.   90 tablet   2   . METOPROLOL SUCCINATE ER 25 MG PO TB24   Oral   Take 1 tablet (25 mg total) by mouth daily.   90 tablet   3   . THERA M PLUS PO TABS   Oral   Take 1 tablet by mouth daily.           Marland Kitchen NITROGLYCERIN 0.4 MG SL SUBL   Sublingual   Place  0.4 mg under the tongue every 5 (five) minutes as needed. For chest pain          . PANTOPRAZOLE SODIUM 40 MG PO TBEC      TAKE 1 TABLET BY MOUTH ONCE DAILY AT 12 NOON   30 tablet   PRN   . PREGABALIN 75 MG PO CAPS   Oral   Take 325 mg by mouth 2 (two) times daily.          Marland Kitchen RANOLAZINE ER 500 MG PO TB12   Oral   Take 2 tablets (1,000 mg total) by mouth 2 (two) times daily.   360 tablet   2   . TRAMADOL HCL 50 MG PO TABS   Oral   Take 50 mg by mouth 3 (three) times daily as needed. For pain           BP 137/68  Pulse 78  Temp 98.7 F (37.1 C) (Oral)  Resp 20  SpO2 98%  Physical Exam  Nursing note and vitals reviewed. Constitutional: She is oriented to person, place, and time. She appears well-developed and well-nourished. No distress.  HENT:  Head: Normocephalic.  Eyes: Conjunctivae normal and EOM are normal.  Cardiovascular: Normal rate.   Pulmonary/Chest: Effort normal. No stridor.  Abdominal: Soft. Bowel sounds are normal. She exhibits no distension and no mass. There is tenderness. There is no rebound and no guarding.       Tender to deep palpation of the left upper and left lower quadrant. No rebound.  Musculoskeletal: Normal range of motion.  Neurological: She is alert and oriented to person, place, and time.  Psychiatric: She has a normal mood and affect.    ED Course  Procedures (including critical care time)  Labs Reviewed  URINALYSIS, ROUTINE W REFLEX MICROSCOPIC - Abnormal; Notable for the following:    APPearance CLOUDY (*)     Leukocytes, UA TRACE (*)     All other components within normal limits  URINE MICROSCOPIC-ADD ON - Abnormal; Notable for the following:    Squamous Epithelial / LPF FEW (*)     All other components within normal limits  CBC WITH DIFFERENTIAL  COMPREHENSIVE METABOLIC PANEL  LAB REPORT - SCANNED   Ct Abdomen Pelvis W Contrast  11/22/2012  *RADIOLOGY REPORT*  Clinical Data: Left side abdominal and flank pain,  nausea, past history of borderline diabetes, fibromyalgia, diverticulosis, cholecystectomy, hysterectomy, coronary artery disease, hypertension  CT ABDOMEN AND PELVIS WITH CONTRAST  Technique:  Multidetector CT imaging of the abdomen and pelvis was performed following the standard protocol during bolus administration of intravenous contrast. Sagittal and coronal MPR images reconstructed from axial data set.  Contrast: OMNIPAQUE IOHEXOL 300 MG/ML  SOLN Dilute oral contrast.  Comparison: 04/10/2012  Findings: Minimal patchy infiltrate at lung bases. Gallbladder and uterus surgically absent with nonvisualization of ovaries. Liver, spleen, pancreas, kidneys, and adrenal glands normal appearance. Normal appendix. Descending and sigmoid diverticulosis with wall thickening and minimal adjacent infiltration at the sigmoid mesocolon compatible with acute diverticulitis. No evidence of perforation or abscess. Small amount of nonspecific low attenuation free pelvic fluid. No mass, adenopathy, or free air. Tiny umbilical hernia containing fat. No acute osseous findings.  IMPRESSION: Acute sigmoid diverticulitis.   Original Report Authenticated By: Ulyses Southward, M.D.      1. Diverticulitis       MDM  Patient with moderate left-sided abdominal pain and history of diverticulitis. CT scan ordered.  CT shows acute sigmoid diverticulitis. We'll treat her with Cipro and Flagyl. Return precautions given.  New Prescriptions   CIPROFLOXACIN (CIPRO) 500 MG TABLET    Take 1 tablet (500 mg total) by mouth every 12 (twelve) hours.   METRONIDAZOLE (FLAGYL) 500 MG TABLET    Take 1 tablet (500 mg total) by mouth 3 (three) times daily.   OXYCODONE-ACETAMINOPHEN (PERCOCET/ROXICET) 5-325 MG PER TABLET    1 to 2 tabs PO q6hrs  PRN for pain          Wynetta Emery, PA-C 11/23/12 2217

## 2012-11-24 NOTE — ED Provider Notes (Signed)
Medical screening examination/treatment/procedure(s) were performed by non-physician practitioner and as supervising physician I was immediately available for consultation/collaboration.   Charles B. Bernette Mayers, MD 11/24/12 [phone]

## 2013-03-14 ENCOUNTER — Inpatient Hospital Stay (HOSPITAL_COMMUNITY)
Admission: EM | Admit: 2013-03-14 | Discharge: 2013-03-17 | DRG: 313 | Disposition: A | Discharge status: Home / Self Care | Payer: 59 | Attending: Cardiovascular Disease | Admitting: Cardiovascular Disease

## 2013-03-14 ENCOUNTER — Emergency Department (HOSPITAL_COMMUNITY): Payer: 59

## 2013-03-14 ENCOUNTER — Encounter (HOSPITAL_COMMUNITY): Payer: Self-pay | Admitting: *Deleted

## 2013-03-14 DIAGNOSIS — Z9861 Coronary angioplasty status: Secondary | ICD-10-CM

## 2013-03-14 DIAGNOSIS — E119 Type 2 diabetes mellitus without complications: Secondary | ICD-10-CM | POA: Diagnosis present

## 2013-03-14 DIAGNOSIS — Z880 Allergy status to penicillin: Secondary | ICD-10-CM

## 2013-03-14 DIAGNOSIS — M797 Fibromyalgia: Secondary | ICD-10-CM | POA: Diagnosis present

## 2013-03-14 DIAGNOSIS — M1990 Unspecified osteoarthritis, unspecified site: Secondary | ICD-10-CM | POA: Diagnosis present

## 2013-03-14 DIAGNOSIS — Z6841 Body Mass Index (BMI) 40.0 and over, adult: Secondary | ICD-10-CM

## 2013-03-14 DIAGNOSIS — R0789 Other chest pain: Secondary | ICD-10-CM | POA: Diagnosis present

## 2013-03-14 DIAGNOSIS — R072 Precordial pain: Principal | ICD-10-CM | POA: Diagnosis present

## 2013-03-14 DIAGNOSIS — E785 Hyperlipidemia, unspecified: Secondary | ICD-10-CM | POA: Diagnosis present

## 2013-03-14 DIAGNOSIS — I2510 Atherosclerotic heart disease of native coronary artery without angina pectoris: Secondary | ICD-10-CM | POA: Diagnosis present

## 2013-03-14 DIAGNOSIS — I10 Essential (primary) hypertension: Secondary | ICD-10-CM | POA: Diagnosis present

## 2013-03-14 DIAGNOSIS — Z881 Allergy status to other antibiotic agents status: Secondary | ICD-10-CM

## 2013-03-14 DIAGNOSIS — IMO0001 Reserved for inherently not codable concepts without codable children: Secondary | ICD-10-CM | POA: Diagnosis present

## 2013-03-14 DIAGNOSIS — I200 Unstable angina: Secondary | ICD-10-CM

## 2013-03-14 LAB — CBC WITH DIFFERENTIAL/PLATELET
Basophils Absolute: 0 10*3/uL (ref 0.0–0.1)
Basophils Relative: 0 % (ref 0–1)
MCHC: 34.4 g/dL (ref 30.0–36.0)
Monocytes Absolute: 0.5 10*3/uL (ref 0.1–1.0)
Neutro Abs: 2.4 10*3/uL (ref 1.7–7.7)
Neutrophils Relative %: 46 % (ref 43–77)
Platelets: 178 10*3/uL (ref 150–400)
RDW: 14.8 % (ref 11.5–15.5)

## 2013-03-14 LAB — COMPREHENSIVE METABOLIC PANEL
AST: 19 U/L (ref 0–37)
Albumin: 3.6 g/dL (ref 3.5–5.2)
Chloride: 106 mEq/L (ref 96–112)
Creatinine, Ser: 0.81 mg/dL (ref 0.50–1.10)
Potassium: 4 mEq/L (ref 3.5–5.1)
Sodium: 143 mEq/L (ref 135–145)
Total Bilirubin: 0.7 mg/dL (ref 0.3–1.2)

## 2013-03-14 MED ORDER — NITROGLYCERIN 0.4 MG SL SUBL
0.4000 mg | SUBLINGUAL_TABLET | SUBLINGUAL | Status: DC | PRN
  Administered 2013-03-14: 0.4 mg via SUBLINGUAL
  Filled 2013-03-14: qty 25

## 2013-03-14 MED ORDER — HEPARIN (PORCINE) IN NACL 100-0.45 UNIT/ML-% IJ SOLN
1000.0000 [IU]/h | INTRAMUSCULAR | Status: DC
  Administered 2013-03-14: 1200 [IU]/h via INTRAVENOUS
  Administered 2013-03-15: 1000 [IU]/h via INTRAVENOUS
  Filled 2013-03-14 (×3): qty 250

## 2013-03-14 MED ORDER — ASPIRIN 81 MG PO CHEW
324.0000 mg | CHEWABLE_TABLET | Freq: Once | ORAL | Status: AC
  Administered 2013-03-14: 324 mg via ORAL
  Filled 2013-03-14: qty 4

## 2013-03-14 MED ORDER — HEPARIN BOLUS VIA INFUSION
4000.0000 [IU] | Freq: Once | INTRAVENOUS | Status: AC
  Administered 2013-03-14: 4000 [IU] via INTRAVENOUS

## 2013-03-14 NOTE — Progress Notes (Signed)
ANTICOAGULATION CONSULT NOTE - Initial Consult  Pharmacy Consult for UFH Indication: USAP  Allergies  Allergen Reactions  . Erythromycin Diarrhea  . Indomethacin Diarrhea  . Biaxin (Clarithromycin) Rash  . Penicillins Rash    Patient Measurements: Height: 5' 2.99" (160 cm) Weight: 226 lb 13.7 oz (102.9 kg) IBW/kg (Calculated) : 52.38 Heparin Dosing Weight: 76kg   Vital Signs: Temp: 98.5 F (36.9 C) (04/05 2006) Temp src: Oral (04/05 2006) BP: 120/74 mmHg (04/05 2006) Pulse Rate: 80 (04/05 2006)  Labs:  Recent Labs  03/14/13 2001  HGB 13.1  HCT 38.1  PLT 178  CREATININE 0.81  TROPONINI <0.30    Estimated Creatinine Clearance: 83.6 ml/min (by C-G formula based on Cr of 0.81).   Medical History: Past Medical History  Diagnosis Date  . CAD (coronary artery disease)     DES to the LAD July 06, 2011  / Relook catheter July 14, 2011 stent patent, moderate mid LAD stenosis but not high grade , plan medical therapy.  Last cath Sept 2012  . Hypertension   . Hyperlipidemia   . Borderline diabetes   . Morbid obesity   . Fibromyalgia   . Osteoarthritis   . Degenerative joint disease   . Groin hematoma     July 06 2011 treated with pressure  . Hematoma     Radial catheter site July 13, 2011  . Diabetes mellitus     Medications:   (Not in a hospital admission)  Assessment: 62 y/o female patient admitted with substernal chest pain requiring anticoagulation for r/o MI. First cardiac enzyme negative.   Goal of Therapy:  Heparin level 0.3-0.7 units/ml Monitor platelets by anticoagulation protocol: Yes   Plan:  Heparin 4000 unit IV bolus followed by infusion at 1200 units/hr. Check 6 hour heparin level with daily cbc and heparin level.  Verlene Mayer, PharmD, BCPS 4/5/20149:40 PM

## 2013-03-14 NOTE — ED Notes (Signed)
The pt has had some central chest tightness for 2-3 days with some sl sob with exertion and some irregular heart rhy.  She has a coronary artery stent.  Alert  No distress

## 2013-03-14 NOTE — ED Provider Notes (Signed)
History     CSN: [id number]  Arrival date & time 03/14/13  1951   First MD Initiated Contact with Patient 03/14/13 1958      Chief Complaint  Patient presents with  . Chest Pain    (Consider location/radiation/quality/duration/timing/severity/associated sxs/prior treatment) Patient is a 62 y.o. female presenting with chest pain. The history is provided by the patient.  Chest Pain Pain location:  Substernal area Pain quality: pressure   Pain radiates to:  Neck Pain severity:  Moderate Onset quality:  Sudden Timing:  Intermittent Progression:  Unchanged Chronicity:  New Context: at rest   Ineffective treatments:  Nitroglycerin Associated symptoms: dizziness, nausea, shortness of breath and weakness   Associated symptoms: no cough and no fever     Past Medical History  Diagnosis Date  . CAD (coronary artery disease)     DES to the LAD July 06, 2011  / Relook catheter July 14, 2011 stent patent, moderate mid LAD stenosis but not high grade , plan medical therapy.  Last cath Sept 2012  . Hypertension   . Hyperlipidemia   . Borderline diabetes   . Morbid obesity   . Fibromyalgia   . Osteoarthritis   . Degenerative joint disease   . Groin hematoma     July 06 2011 treated with pressure  . Hematoma     Radial catheter site July 13, 2011  . Diabetes mellitus     Past Surgical History  Procedure Laterality Date  . Cardiac catheterization      revealing patency of the LAD stent with mild compromise of  the diagonal and moderate mid LAD stenosis, but does not appear to  be high grade.   . Cholecystectomy    . Tonsillectomy    . Carpal tunnel release    . Bunionectomy    . Knee arthroscopy    . Abdominal hysterectomy    . Angioplasty      Family History  Problem Relation Age of Onset  . Other Mother     died pulmonary issues  . Coronary artery disease Father 6    died- CABG-HTN- AAA    History  Substance Use Topics  . Smoking status: Never Smoker   .  Smokeless tobacco: Never Used  . Alcohol Use: Yes     Comment: occasional    OB History   Grav Para Term Preterm Abortions TAB SAB Ect Mult Living                  Review of Systems  Constitutional: Negative for fever.  Respiratory: Positive for shortness of breath. Negative for cough.   Cardiovascular: Positive for chest pain.  Gastrointestinal: Positive for nausea.  Neurological: Positive for dizziness and weakness.  All other systems reviewed and are negative.    Allergies  Erythromycin; Indomethacin; Biaxin; and Penicillins  Home Medications   Current Outpatient Rx  Name  Route  Sig  Dispense  Refill  . albuterol (PROVENTIL,VENTOLIN) 90 MCG/ACT inhaler   Inhalation   Inhale 1-2 puffs into the lungs every 4 (four) hours as needed. For wheezing         . amLODipine (NORVASC) 2.5 MG tablet   Oral   Take 1 tablet (2.5 mg total) by mouth daily.   90 tablet   1   . aspirin EC 81 MG tablet   Oral   Take 81 mg by mouth daily.         Marland Kitchen atorvastatin (LIPITOR) 20 MG tablet  Oral   Take 1 tablet (20 mg total) by mouth daily.         . benazepril (LOTENSIN) 5 MG tablet   Oral   Take 5 mg by mouth daily.           . calcium carbonate 200 MG capsule   Oral   Take 250 mg by mouth daily.           . ciprofloxacin (CIPRO) 500 MG tablet   Oral   Take 1 tablet (500 mg total) by mouth every 12 (twelve) hours.   28 tablet   0   . clopidogrel (PLAVIX) 75 MG tablet   Oral   Take 75 mg by mouth daily.         . cyclobenzaprine (FLEXERIL) 10 MG tablet   Oral   Take 10 mg by mouth 3 (three) times daily as needed. For back pain         . DULoxetine (CYMBALTA) 30 MG capsule   Oral   Take 90 mg by mouth daily.          . ergocalciferol (VITAMIN D2) 50000 UNITS capsule   Oral   Take 50,000 Units by mouth once a week. Take on Saturday         . isosorbide mononitrate (IMDUR) 30 MG 24 hr tablet   Oral   Take 15 mg by mouth daily.         .  metoprolol succinate (TOPROL-XL) 25 MG 24 hr tablet   Oral   Take 25 mg by mouth daily.         . metroNIDAZOLE (FLAGYL) 500 MG tablet   Oral   Take 1 tablet (500 mg total) by mouth 3 (three) times daily.   42 tablet   0   . Multiple Vitamins-Minerals (MULTIVITAMINS THER. W/MINERALS) TABS   Oral   Take 1 tablet by mouth daily.           . nitroGLYCERIN (NITROSTAT) 0.4 MG SL tablet   Sublingual   Place 0.4 mg under the tongue every 5 (five) minutes as needed. For chest pain          . oxyCODONE-acetaminophen (PERCOCET/ROXICET) 5-325 MG per tablet      1 to 2 tabs PO q6hrs  PRN for pain   15 tablet   0   . pantoprazole (PROTONIX) 40 MG tablet   Oral   Take 40 mg by mouth daily.         . pregabalin (LYRICA) 75 MG capsule   Oral   Take 225 mg by mouth 2 (two) times daily.          . ranolazine (RANEXA) 500 MG 12 hr tablet   Oral   Take 2 tablets (1,000 mg total) by mouth 2 (two) times daily.   360 tablet   2   . traMADol (ULTRAM) 50 MG tablet   Oral   Take 50 mg by mouth 3 (three) times daily as needed. For pain           BP 120/74  Pulse 80  Temp(Src) 98.5 F (36.9 C) (Oral)  SpO2 93%  Physical Exam  Nursing note and vitals reviewed. Constitutional: She is oriented to person, place, and time. She appears well-developed and well-nourished. No distress.  HENT:  Head: Normocephalic and atraumatic.  Mouth/Throat: Oropharynx is clear and moist.  Eyes: Conjunctivae are normal. Pupils are equal, round, and reactive to light. No scleral icterus.  Neck: Neck  supple.  Cardiovascular: Normal rate, normal heart sounds and intact distal pulses.  An irregularly irregular rhythm present.  No murmur heard. Pulmonary/Chest: Effort normal and breath sounds normal. No stridor. No respiratory distress. She has no rales.  Abdominal: Soft. Bowel sounds are normal. She exhibits no distension. There is no tenderness.  Musculoskeletal: Normal range of motion.   Neurological: She is alert and oriented to person, place, and time.  Skin: Skin is warm and dry. No rash noted.  Psychiatric: She has a normal mood and affect. Her behavior is normal.    ED Course  Procedures (including critical care time)  Labs Reviewed  COMPREHENSIVE METABOLIC PANEL - Abnormal; Notable for the following:    GFR calc non Af Amer 77 (*)    GFR calc Af Amer 89 (*)    All other components within normal limits  CBC WITH DIFFERENTIAL  TROPONIN I   Dg Chest 2 View  03/14/2013  *RADIOLOGY REPORT*  Clinical Data: Chest pain.  CHEST - 2 VIEW  Comparison: February 22, 2012.  Findings: Cardiomediastinal silhouette appears normal.  No acute pulmonary disease is noted.  Bony thorax is intact.  IMPRESSION: No acute cardiopulmonary abnormality seen.   Original Report Authenticated By: Lupita Raider.,  M.D.   All radiology studies independently viewed by me.      Date: 03/14/2013  Rate: 82  Rhythm: normal sinus rhythm and premature ventricular contractions (PVC)  QRS Axis: right  Intervals: normal  ST/T Wave abnormalities: normal  Conduction Disutrbances:none  Narrative Interpretation: PVCs present now  Old EKG Reviewed: changes noted   1. Unstable angina       MDM   62 yo female with hx of CAD presenting with intermittent episodes of chest pain, increased palpitations, and fatigue.  Initial cardiac workup negative, however story concerning for unstable angina.  Will start heparin and admit.         Rennis Petty, MD 03/14/13 [phone]

## 2013-03-14 NOTE — ED Notes (Signed)
Verified Heparin with Margit Banda.

## 2013-03-14 NOTE — ED Notes (Signed)
O2 2L  applied

## 2013-03-15 DIAGNOSIS — I200 Unstable angina: Secondary | ICD-10-CM

## 2013-03-15 LAB — BASIC METABOLIC PANEL
BUN: 22 mg/dL (ref 6–23)
Calcium: 8.5 mg/dL (ref 8.4–10.5)
Chloride: 108 mEq/L (ref 96–112)
Creatinine, Ser: 0.78 mg/dL (ref 0.50–1.10)
GFR calc Af Amer: >90 mL/min (ref >90)
GFR calc non Af Amer: 88 mL/min — ABNORMAL LOW (ref >90)

## 2013-03-15 LAB — CBC
HCT: 33.8 % — ABNORMAL LOW (ref 36.0–46.0)
Hemoglobin: 11.5 g/dL — ABNORMAL LOW (ref 12.0–15.0)
MCHC: 34 g/dL (ref 30.0–36.0)

## 2013-03-15 LAB — MRSA PCR SCREENING: MRSA by PCR: NEGATIVE

## 2013-03-15 LAB — LIPID PANEL
Cholesterol: 148 mg/dL (ref 0–200)
LDL Cholesterol: 65 mg/dL (ref 0–99)
Total CHOL/HDL Ratio: 3.4 RATIO
VLDL: 39 mg/dL (ref 0–40)

## 2013-03-15 LAB — HEPARIN LEVEL (UNFRACTIONATED): Heparin Unfractionated: 0.91 IU/mL — ABNORMAL HIGH (ref 0.30–0.70)

## 2013-03-15 MED ORDER — ASPIRIN EC 81 MG PO TBEC
81.0000 mg | DELAYED_RELEASE_TABLET | Freq: Every day | ORAL | Status: DC
Start: 2013-03-15 — End: 2013-03-17
  Administered 2013-03-15 – 2013-03-17 (×3): 81 mg via ORAL
  Filled 2013-03-15 (×3): qty 1

## 2013-03-15 MED ORDER — ISOSORBIDE MONONITRATE 15 MG HALF TABLET
15.0000 mg | ORAL_TABLET | Freq: Every day | ORAL | Status: DC
  Administered 2013-03-15 – 2013-03-17 (×3): 15 mg via ORAL
  Filled 2013-03-15 (×3): qty 1

## 2013-03-15 MED ORDER — ONDANSETRON HCL 4 MG/2ML IJ SOLN: 4.0000 mg | Freq: Four times a day (QID) | INTRAMUSCULAR | Status: DC | PRN

## 2013-03-15 MED ORDER — PANTOPRAZOLE SODIUM 40 MG PO TBEC
40.0000 mg | DELAYED_RELEASE_TABLET | Freq: Every day | ORAL | Status: DC
  Administered 2013-03-15 – 2013-03-17 (×3): 40 mg via ORAL
  Filled 2013-03-15 (×3): qty 1

## 2013-03-15 MED ORDER — BENAZEPRIL HCL 5 MG PO TABS
5.0000 mg | ORAL_TABLET | Freq: Every day | ORAL | Status: DC
  Administered 2013-03-15 – 2013-03-17 (×3): 5 mg via ORAL
  Filled 2013-03-15 (×3): qty 1

## 2013-03-15 MED ORDER — CYCLOBENZAPRINE HCL 10 MG PO TABS
10.0000 mg | ORAL_TABLET | Freq: Three times a day (TID) | ORAL | Status: DC | PRN
  Administered 2013-03-15: 10 mg via ORAL
  Filled 2013-03-15: qty 1

## 2013-03-15 MED ORDER — PREGABALIN 75 MG PO CAPS: 225.0000 mg | ORAL_CAPSULE | Freq: Two times a day (BID) | ORAL | Status: DC

## 2013-03-15 MED ORDER — NITROGLYCERIN 0.4 MG SL SUBL: 0.4000 mg | SUBLINGUAL_TABLET | SUBLINGUAL | Status: DC | PRN

## 2013-03-15 MED ORDER — TRAMADOL HCL 50 MG PO TABS
50.0000 mg | ORAL_TABLET | Freq: Three times a day (TID) | ORAL | Status: DC | PRN
  Administered 2013-03-15: 50 mg via ORAL
  Filled 2013-03-15: qty 1

## 2013-03-15 MED ORDER — CLOPIDOGREL BISULFATE 75 MG PO TABS
75.0000 mg | ORAL_TABLET | Freq: Every day | ORAL | Status: DC
  Administered 2013-03-15 – 2013-03-16 (×2): 75 mg via ORAL
  Filled 2013-03-15 (×4): qty 1

## 2013-03-15 MED ORDER — ATORVASTATIN CALCIUM 20 MG PO TABS
20.0000 mg | ORAL_TABLET | Freq: Every day | ORAL | Status: DC
  Administered 2013-03-15 – 2013-03-17 (×3): 20 mg via ORAL
  Filled 2013-03-15 (×3): qty 1

## 2013-03-15 MED ORDER — RANOLAZINE ER 500 MG PO TB12
1000.0000 mg | ORAL_TABLET | Freq: Two times a day (BID) | ORAL | Status: DC
  Administered 2013-03-15 – 2013-03-17 (×5): 1000 mg via ORAL
  Filled 2013-03-15 (×8): qty 2

## 2013-03-15 MED ORDER — METOPROLOL SUCCINATE ER 25 MG PO TB24
25.0000 mg | ORAL_TABLET | Freq: Every day | ORAL | Status: DC
  Filled 2013-03-15: qty 1

## 2013-03-15 MED ORDER — METOPROLOL SUCCINATE ER 50 MG PO TB24
50.0000 mg | ORAL_TABLET | Freq: Every day | ORAL | Status: DC
  Administered 2013-03-15 – 2013-03-16 (×2): 50 mg via ORAL
  Filled 2013-03-15 (×3): qty 1

## 2013-03-15 MED ORDER — PREGABALIN 25 MG PO CAPS
225.0000 mg | ORAL_CAPSULE | Freq: Two times a day (BID) | ORAL | Status: DC
  Administered 2013-03-15 – 2013-03-17 (×6): 225 mg via ORAL
  Filled 2013-03-15: qty 4
  Filled 2013-03-15: qty 2
  Filled 2013-03-15: qty 4
  Filled 2013-03-15 (×3): qty 2

## 2013-03-15 MED ORDER — LOPERAMIDE HCL 2 MG PO CAPS
2.0000 mg | ORAL_CAPSULE | Freq: Three times a day (TID) | ORAL | Status: DC | PRN
  Administered 2013-03-15: 2 mg via ORAL
  Filled 2013-03-15: qty 1

## 2013-03-15 MED ORDER — ACETAMINOPHEN 325 MG PO TABS: 650.0000 mg | ORAL_TABLET | ORAL | Status: DC | PRN

## 2013-03-15 MED ORDER — DULOXETINE HCL 60 MG PO CPEP
90.0000 mg | ORAL_CAPSULE | Freq: Every day | ORAL | Status: DC
  Administered 2013-03-15 – 2013-03-17 (×3): 90 mg via ORAL
  Filled 2013-03-15 (×3): qty 1

## 2013-03-15 NOTE — Progress Notes (Signed)
ANTICOAGULATION CONSULT NOTE - Initial Consult  Pharmacy Consult for UFH Indication: USAP  Allergies  Allergen Reactions  . Erythromycin Diarrhea  . Indomethacin Diarrhea  . Biaxin (Clarithromycin) Rash  . Penicillins Rash    Patient Measurements: Height: 5' 2.99" (160 cm) Weight: 226 lb 13.7 oz (102.9 kg) IBW/kg (Calculated) : 52.38 Heparin Dosing Weight: 76kg   Vital Signs: Temp: 98.2 F (36.8 C) (04/06 1436) Temp src: Oral (04/06 1436) BP: 107/54 mmHg (04/06 1436) Pulse Rate: 69 (04/06 1436)  Labs:  Recent Labs  03/14/13 2001 03/15/13 0425 03/15/13 0902 03/15/13 1359  HGB 13.1 11.5*  --   --   HCT 38.1 33.8*  --   --   PLT 178 160  --   --   HEPARINUNFRC  --  0.91*  --  0.57  CREATININE 0.81 0.78  --   --   TROPONINI <0.30  --  <0.30  --     Estimated Creatinine Clearance: 84.6 ml/min (by C-G formula based on Cr of 0.78).  Assessment: 62 y/o female chest pain for heparin. Heparin level is at goal (HL= 0.57) on 1000 units/hr.  Goal of Therapy:  Heparin level 0.3-0.7 units/ml Monitor platelets by anticoagulation protocol: Yes   Plan:   -No heparin changes needed -Will recheck a level in am  Harland German, Pharm D 03/15/2013 3:03 PM

## 2013-03-15 NOTE — Progress Notes (Signed)
ANTICOAGULATION CONSULT NOTE - Initial Consult  Pharmacy Consult for UFH Indication: USAP  Allergies  Allergen Reactions  . Erythromycin Diarrhea  . Indomethacin Diarrhea  . Biaxin (Clarithromycin) Rash  . Penicillins Rash    Patient Measurements: Height: 5' 2.99" (160 cm) Weight: 226 lb 13.7 oz (102.9 kg) IBW/kg (Calculated) : 52.38 Heparin Dosing Weight: 76kg   Vital Signs: Temp: 98.5 F (36.9 C) (04/05 2006) Temp src: Oral (04/05 2006) BP: 93/45 mmHg (04/06 0300) Pulse Rate: 69 (04/06 0300)  Labs:  Recent Labs  03/14/13 2001 03/15/13 0425  HGB 13.1 11.5*  HCT 38.1 33.8*  PLT 178 160  HEPARINUNFRC  --  0.91*  CREATININE 0.81 0.78  TROPONINI <0.30  --     Estimated Creatinine Clearance: 84.6 ml/min (by C-G formula based on Cr of 0.78).  Assessment: 62 y/o female chest pain for heparin  Goal of Therapy:  Heparin level 0.3-0.7 units/ml Monitor platelets by anticoagulation protocol: Yes   Plan:  Decrease heparin 1000 units/hr Check heparin level in 6 hours.  Geannie Risen, PharmD, BCPS    4/6/20146:25 AM

## 2013-03-15 NOTE — Progress Notes (Signed)
Patient ID: Courtney Steele, female   DOB: 01/14/1951, 62 y.o.   MRN: [id number]    SUBJECTIVE: No further chest pain. Occasional palpitations.   Marland Kitchen aspirin EC  81 mg Oral Daily  . atorvastatin  20 mg Oral Daily  . benazepril  5 mg Oral Daily  . clopidogrel  75 mg Oral Daily  . DULoxetine  90 mg Oral Daily  . isosorbide mononitrate  15 mg Oral Daily  . metoprolol succinate  50 mg Oral Daily  . pantoprazole  40 mg Oral Daily  . pregabalin  225 mg Oral BID  . ranolazine  1,000 mg Oral BID      Filed Vitals:   03/15/13 0145 03/15/13 0300 03/15/13 0420 03/15/13 0759  BP: 102/58 93/45 100/56 134/79  Pulse: 67 69 65 66  Temp:    97.7 F (36.5 C)  TempSrc:    Oral  Resp: 14     Height:      Weight:      SpO2: 97% 97% 98% 98%    Intake/Output Summary (Last 24 hours) at 03/15/13 0914 Last data filed at 03/15/13 0600  Gross per 24 hour  Intake   88.2 ml  Output      0 ml  Net   88.2 ml    LABS: Basic Metabolic Panel:  Recent Labs  96/29/52 2001 03/15/13 0425  NA 143 142  K 4.0 3.4*  CL 106 108  CO2 27 28  GLUCOSE 90 109*  BUN 23 22  CREATININE 0.81 0.78  CALCIUM 9.2 8.5   Liver Function Tests:  Recent Labs  03/14/13 2001  AST 19  ALT 14  ALKPHOS 62  BILITOT 0.7  PROT 6.6  ALBUMIN 3.6   No results found for this basename: LIPASE, AMYLASE,  in the last 72 hours CBC:  Recent Labs  03/14/13 2001 03/15/13 0425  WBC 5.2 5.7  NEUTROABS 2.4  --   HGB 13.1 11.5*  HCT 38.1 33.8*  MCV 87.2 87.1  PLT 178 160   Cardiac Enzymes:  Recent Labs  03/14/13 2001  TROPONINI <0.30   BNP: No components found with this basename: POCBNP,  D-Dimer: No results found for this basename: DDIMER,  in the last 72 hours Hemoglobin A1C: No results found for this basename: HGBA1C,  in the last 72 hours Fasting Lipid Panel:  Recent Labs  03/15/13 0435  CHOL 148  HDL 44  LDLCALC 65  TRIG 195*  CHOLHDL 3.4   Thyroid Function Tests: No results found for this  basename: TSH, T4TOTAL, FREET3, T3FREE, THYROIDAB,  in the last 72 hours Anemia Panel: No results found for this basename: VITAMINB12, FOLATE, FERRITIN, TIBC, IRON, RETICCTPCT,  in the last 72 hours  RADIOLOGY: Dg Chest 2 View  03/14/2013  *RADIOLOGY REPORT*  Clinical Data: Chest pain.  CHEST - 2 VIEW  Comparison: February 22, 2012.  Findings: Cardiomediastinal silhouette appears normal.  No acute pulmonary disease is noted.  Bony thorax is intact.  IMPRESSION: No acute cardiopulmonary abnormality seen.   Original Report Authenticated By: Lupita Raider.,  M.D.     PHYSICAL EXAM General: NAD Neck: No JVD, no thyromegaly or thyroid nodule.  Lungs: Clear to auscultation bilaterally with normal respiratory effort. CV: Nondisplaced PMI.  Heart regular S1/S2, no S3/S4, no murmur.  No peripheral edema.  No carotid bruit.  Normal pedal pulses.  Abdomen: Soft, nontender, no hepatosplenomegaly, no distention.  Neurologic: Alert and oriented x 3.  Psych: Normal affect. Extremities: No  clubbing or cyanosis.   TELEMETRY: Reviewed telemetry pt in NSR with PVCs  ASSESSMENT AND PLAN: 62 yo with history of CAD presents with somewhat atypical chest pain. Patient has had 2 days of chest pain at rest.  The pain only lasts for a few seconds at a time then resolves.  No trigger.  She had actually had no chest pain for about 4-5 months prior to this episode.  Last Myoview in 3/13 was normal.  She had DES to the LAD in 7/12.  - Continue ASA, heparin - Has had only 1 set of cardiac enzymes, continue to cycle.  If she rules out, could consider stress Myoview tomorrow given atypical nature of her symptoms (lasts only a few seconds).   - Will increase Toprol XL to 50 mg daily given PVCs noted on monitoring and palpitations.   Courtney Steele 03/15/2013 9:26 AM

## 2013-03-15 NOTE — ED Provider Notes (Signed)
I saw and evaluated the patient, reviewed the resident's note and I agree with the findings and plan.  Pertinent History: Overweight, hx of CAD, presents with worsening CP and exertional sx Pertinent Exam findings: clear heart and lung sounds, abdominal NT, no edema  Pt with incerased risk for CAD / UA, to be admitted.  I personally interpreted the EKG as well as the resident and agree with the interpretation on the resident's chart.   Vida Roller, MD 03/15/13 1257

## 2013-03-15 NOTE — H&P (Signed)
Courtney Steele is an 62 y.o. female.    Chief Complaint: Chest pain  HPI: 62 y/o female with a PMH of CAD presenting for chest pain evaluation. She has a PMH CAD s/p cardiac cath 07/06/2011 with DES LAD stent.  Due to recurrent chest pain, patient underwent a re-look cardiac cath 07/2011 which showed patent LAD stent and 50% mid-LAD stenosis. She was last admitted 02/2012 for recurrent chest pain and underwent a Lexiscan Stress test which was negative for ischemia, LVEF 70%. Patient has been doing fine and has not had any chest pain for the last 6 month.  She now complain of 2 days history of recurrent and escalating chest pain that she describes as a squeezing/tightness that occurs at rest, lasts a few minutes, radiates to her jaw and associated with palpitation and shortness of breath. She denies any nausea, vomiting, diaphoresis or syncope. Over the last 2 days she has had increasing frequency and duration of her symptoms. In the ER, her first set of cardiac markers are unremarkable and her EKG showed sinus rhythm (83 bpm) with no definite ST or T wave changes to suggest ischemia. She is currently chest pain free and is clinically and hemodynamically stable  Indication for Stress Test: Evaluation for Ischemia, PTCA/Stent Patency, and Patient seen in hospital on 02/22/12 for CP radiating to upper back, (R) jaw, and (L) neck, Enzymes negative  History: 11/10 Myocardial Perfusion Study-NL, EF=61%, 07/06/11 PTCA/Stent LAD, 08/28/11 Heart Catheterization-70% DIAG, 50% mid LAD beyond stent, Patent LAD stent  Cardiac Risk Factors: Family History - CAD, Hypertension, Lipids and NIDDM  Symptoms: Chest Pain and Chest Pressure with/without exertion (last occurrence 30 minutes ago),  DOE, Fatigue, Fatigue with Exertion, Light-Headedness, Near Syncope and Palpitations   Impression  Exercise Capacity: Lexiscan with no exercise.  BP Response: Normal blood pressure response.  Clinical Symptoms: There is dyspnea and chest  tightness.  ECG Impression: No significant ST segment change suggestive of ischemia.  Comparison with Prior Nuclear Study: No significant change from previous study  Overall Impression: Normal stress nuclear study.  LV Ejection Fraction: 70%. LV Wall Motion: NL LV Function; NL Wall Motion    Past Medical History  Diagnosis Date  . CAD (coronary artery disease)     DES to the LAD July 06, 2011  / Relook catheter July 14, 2011 stent patent, moderate mid LAD stenosis but not high grade , plan medical therapy.  Last cath Sept 2012  . Hypertension   . Hyperlipidemia   . Borderline diabetes   . Morbid obesity   . Fibromyalgia   . Osteoarthritis   . Degenerative joint disease   . Groin hematoma     July 06 2011 treated with pressure  . Hematoma     Radial catheter site July 13, 2011  . Diabetes mellitus     Past Surgical History  Procedure Laterality Date  . Cardiac catheterization      revealing patency of the LAD stent with mild compromise of  the diagonal and moderate mid LAD stenosis, but does not appear to  be high grade.   . Cholecystectomy    . Tonsillectomy    . Carpal tunnel release    . Bunionectomy    . Knee arthroscopy    . Abdominal hysterectomy    . Angioplasty      Family History  Problem Relation Age of Onset  . Other Mother     died pulmonary issues  . Coronary artery disease Father 53  died- CABG-HTN- AAA   Social History:  reports that she has never smoked. She has never used smokeless tobacco. She reports that  drinks alcohol. She reports that she does not use illicit drugs.  Allergies:  Allergies  Allergen Reactions  . Erythromycin Diarrhea  . Indomethacin Diarrhea  . Biaxin (Clarithromycin) Rash  . Penicillins Rash     (Not in a hospital admission)  Results for orders placed during the hospital encounter of 03/14/13 (from the past 48 hour(s))  CBC WITH DIFFERENTIAL     Status: None   Collection Time    03/14/13  8:01 PM      Result  Value Range   WBC 5.2  4.0 - 10.5 K/uL   RBC 4.37  3.87 - 5.11 MIL/uL   Hemoglobin 13.1  12.0 - 15.0 g/dL   HCT 04.5  40.9 - 81.1 %   MCV 87.2  78.0 - 100.0 fL   MCH 30.0  26.0 - 34.0 pg   MCHC 34.4  30.0 - 36.0 g/dL   RDW 91.4  78.2 - 95.6 %   Platelets 178  150 - 400 K/uL   Neutrophils Relative 46  43 - 77 %   Neutro Abs 2.4  1.7 - 7.7 K/uL   Lymphocytes Relative 42  12 - 46 %   Lymphs Abs 2.2  0.7 - 4.0 K/uL   Monocytes Relative 9  3 - 12 %   Monocytes Absolute 0.5  0.1 - 1.0 K/uL   Eosinophils Relative 3  0 - 5 %   Eosinophils Absolute 0.1  0.0 - 0.7 K/uL   Basophils Relative 0  0 - 1 %   Basophils Absolute 0.0  0.0 - 0.1 K/uL  COMPREHENSIVE METABOLIC PANEL     Status: Abnormal   Collection Time    03/14/13  8:01 PM      Result Value Range   Sodium 143  135 - 145 mEq/L   Potassium 4.0  3.5 - 5.1 mEq/L   Chloride 106  96 - 112 mEq/L   CO2 27  19 - 32 mEq/L   Glucose, Bld 90  70 - 99 mg/dL   BUN 23  6 - 23 mg/dL   Creatinine, Ser 2.13  0.50 - 1.10 mg/dL   Calcium 9.2  8.4 - 08.6 mg/dL   Total Protein 6.6  6.0 - 8.3 g/dL   Albumin 3.6  3.5 - 5.2 g/dL   AST 19  0 - 37 U/L   ALT 14  0 - 35 U/L   Alkaline Phosphatase 62  39 - 117 U/L   Total Bilirubin 0.7  0.3 - 1.2 mg/dL   GFR calc non Af Amer 77 (*) >90 mL/min   GFR calc Af Amer 89 (*) >90 mL/min   Comment:            The eGFR has been calculated     using the CKD EPI equation.     This calculation has not been     validated in all clinical     situations.     eGFR's persistently     <90 mL/min signify     possible Chronic Kidney Disease.  TROPONIN I     Status: None   Collection Time    03/14/13  8:01 PM      Result Value Range   Troponin I <0.30  <0.30 ng/mL   Comment:            Due to the  release kinetics of cTnI,     a negative result within the first hours     of the onset of symptoms does not rule out     myocardial infarction with certainty.     If myocardial infarction is still suspected,      repeat the test at appropriate intervals.   Dg Chest 2 View  03/14/2013  *RADIOLOGY REPORT*  Clinical Data: Chest pain.  CHEST - 2 VIEW  Comparison: February 22, 2012.  Findings: Cardiomediastinal silhouette appears normal.  No acute pulmonary disease is noted.  Bony thorax is intact.  IMPRESSION: No acute cardiopulmonary abnormality seen.   Original Report Authenticated By: Lupita Raider.,  M.D.     Review of Systems  Constitutional: Negative for fever, chills, weight loss, malaise/fatigue and diaphoresis.  HENT: Positive for neck pain. Negative for hearing loss, ear pain, nosebleeds, congestion, sore throat, tinnitus and ear discharge.   Eyes: Negative for blurred vision, double vision, photophobia, pain, discharge and redness.  Respiratory: Positive for shortness of breath. Negative for cough, hemoptysis, sputum production, wheezing and stridor.   Cardiovascular: Positive for chest pain. Negative for palpitations, orthopnea, claudication, leg swelling and PND.  Gastrointestinal: Negative for heartburn, nausea, vomiting, abdominal pain, diarrhea, constipation and blood in stool.  Genitourinary: Negative for dysuria, urgency, frequency, hematuria and flank pain.  Musculoskeletal: Negative for myalgias, back pain and joint pain.  Skin: Negative for itching and rash.  Neurological: Negative for dizziness, tingling, tremors, sensory change, speech change, focal weakness, seizures, weakness and headaches.  Psychiatric/Behavioral: Negative for suicidal ideas and substance abuse.    Blood pressure 99/57, pulse 85, temperature 98.5 F (36.9 C), temperature source Oral, resp. rate 15, height 5' 2.99" (1.6 m), weight 102.9 kg (226 lb 13.7 oz), SpO2 98.00%. Physical Exam  Constitutional: She is oriented to person, place, and time. She appears well-developed and well-nourished. No distress.  HENT:  Head: Normocephalic and atraumatic.  Eyes: EOM are normal. Right eye exhibits no discharge. Left eye  exhibits no discharge. No scleral icterus.  Neck: No JVD present.  Cardiovascular: Normal rate, regular rhythm and normal heart sounds.  Exam reveals no friction rub.   No murmur heard. Respiratory: Effort normal and breath sounds normal. No stridor. No respiratory distress. She has no wheezes. She has no rales. She exhibits no tenderness.  GI: She exhibits no distension. There is no tenderness. There is no rebound and no guarding.  Musculoskeletal: She exhibits no edema and no tenderness.  Neurological: She is alert and oriented to person, place, and time.  Skin: No rash noted. She is not diaphoretic. No erythema.  Psychiatric: She has a normal mood and affect.     Assessment/Plan  1. Unstable angina: patient has escalating symptoms of angina for the last 2 days that is occuring with increasing frequency and duration.  We will admit the patient to cardiology to be observed on telemetry.  We will rule out MI with serial cardiac markers and re-initiate patient on her home cardiac medication.  Patient has already been started on a heparin drip.  We plan on performing a cardiac catheterization on Monday.  2.  CAD s/p DES to LAD 06/2011. Patient will be re-started on ASA, Plavix, Statin and Renaxa  3. HTN: currently well-controlled.   Khali Perella E 03/15/2013, 12:10 AM

## 2013-03-15 NOTE — Progress Notes (Signed)
Pt ambulated in hall and denies SOB or CP with exertion

## 2013-03-16 ENCOUNTER — Inpatient Hospital Stay (HOSPITAL_COMMUNITY): Payer: 59

## 2013-03-16 DIAGNOSIS — R079 Chest pain, unspecified: Secondary | ICD-10-CM

## 2013-03-16 LAB — CBC
MCH: 30.2 pg (ref 26.0–34.0)
MCHC: 34.5 g/dL (ref 30.0–36.0)
MCV: 87.5 fL (ref 78.0–100.0)
Platelets: 155 10*3/uL (ref 150–400)
RBC: 3.91 MIL/uL (ref 3.87–5.11)
RDW: 14.9 % (ref 11.5–15.5)

## 2013-03-16 MED ORDER — REGADENOSON 0.4 MG/5ML IV SOLN
0.4000 mg | Freq: Once | INTRAVENOUS | Status: AC
  Administered 2013-03-16: 0.4 mg via INTRAVENOUS
  Filled 2013-03-16: qty 5

## 2013-03-16 MED ORDER — SODIUM CHLORIDE 0.9 % IV BOLUS (SEPSIS)
250.0000 mL | Freq: Once | INTRAVENOUS | Status: AC
  Administered 2013-03-16: 250 mL via INTRAVENOUS

## 2013-03-16 MED ORDER — TECHNETIUM TC 99M SESTAMIBI GENERIC - CARDIOLITE
30.0000 | Freq: Once | INTRAVENOUS | Status: AC | PRN
  Administered 2013-03-16: 30 via INTRAVENOUS

## 2013-03-16 MED ORDER — SODIUM CHLORIDE 0.9 % IV SOLN
INTRAVENOUS | Status: AC
  Administered 2013-03-16: 15:00:00 via INTRAVENOUS

## 2013-03-16 NOTE — Progress Notes (Signed)
Pt c/o dizziness, denies sob or chest pain.  BP 99/61, HR 62, SpO2 97 on room air.  NP notified, orders received to give 250 cc bolus.  Will continue to monitor. Efraim Kaufmann

## 2013-03-16 NOTE — Progress Notes (Signed)
   Subjective:  Denies CP or dyspnea   Objective:  Filed Vitals:   03/15/13 1122 03/15/13 1436 03/15/13 2100 03/16/13 0500  BP: 124/66 107/54 114/71 114/72  Pulse: 65 69 64 63  Temp: 98.3 F (36.8 C) 98.2 F (36.8 C) 98.5 F (36.9 C) 97.5 F (36.4 C)  TempSrc: Oral Oral    Resp: 18 20 16 16   Height:      Weight:      SpO2: 99% 98% 96% 96%    Intake/Output from previous day:  Intake/Output Summary (Last 24 hours) at 03/16/13 0733 Last data filed at 03/16/13 0500  Gross per 24 hour  Intake  830.9 ml  Output   3200 ml  Net -2369.1 ml    Physical Exam: Physical exam: Well-developed well-nourished in no acute distress.  Skin is warm and dry.  HEENT is normal.  Neck is supple. Chest is clear to auscultation with normal expansion.  Cardiovascular exam is regular rate and rhythm.  Abdominal exam nontender or distended. No masses palpated. Extremities show no edema. neuro grossly intact    Lab Results: Basic Metabolic Panel:  Recent Labs  40/98/11 2001 03/15/13 0425  NA 143 142  K 4.0 3.4*  CL 106 108  CO2 27 28  GLUCOSE 90 109*  BUN 23 22  CREATININE 0.81 0.78  CALCIUM 9.2 8.5   CBC:  Recent Labs  03/14/13 2001 03/15/13 0425 03/16/13 0625  WBC 5.2 5.7 5.5  NEUTROABS 2.4  --   --   HGB 13.1 11.5* 11.8*  HCT 38.1 33.8* 34.2*  MCV 87.2 87.1 87.5  PLT 178 160 155   Cardiac Enzymes:  Recent Labs  03/15/13 0902 03/15/13 1359 03/15/13 2050  TROPONINI <0.30 <0.30 <0.30     Assessment/Plan:  1 Chest pain - symptoms atypical; enzymes neg; ECG with no ST changes; proceed with myoview; if neg DC and fu with Dr Myrtis Ser. 2 Hyperlipidemia - continue statin 3 Hypertension - continue present meds.  Olga Millers 03/16/2013, 7:33 AM

## 2013-03-16 NOTE — Progress Notes (Signed)
Went to speak with patient about the Link to [employer] as a benefit of having [employer]. Unfortunately, Courtney Steele was not in the room upon visit. Spoke with husband and left contact information at bedside with husband. Will follow up.  Raiford Noble, MSN-Ed, RN, BSN- [facility], [phone]

## 2013-03-17 ENCOUNTER — Encounter (HOSPITAL_COMMUNITY): Payer: Self-pay | Admitting: Nurse Practitioner

## 2013-03-17 ENCOUNTER — Inpatient Hospital Stay (HOSPITAL_COMMUNITY)
Admit: 2013-03-17 | Discharge: 2013-03-17 | Disposition: A | Discharge status: Home / Self Care | Payer: 59 | Attending: Cardiology | Admitting: Cardiology

## 2013-03-17 DIAGNOSIS — R079 Chest pain, unspecified: Secondary | ICD-10-CM

## 2013-03-17 LAB — CBC
HCT: 39.1 % (ref 36.0–46.0)
MCHC: 33.8 g/dL (ref 30.0–36.0)
Platelets: 177 10*3/uL (ref 150–400)
RDW: 14.8 % (ref 11.5–15.5)

## 2013-03-17 MED ORDER — METOPROLOL SUCCINATE ER 25 MG PO TB24
25.0000 mg | ORAL_TABLET | Freq: Every day | ORAL | Status: DC
  Filled 2013-03-17: qty 1

## 2013-03-17 MED ORDER — NITROGLYCERIN 0.4 MG SL SUBL: 0.4000 mg | SUBLINGUAL_TABLET | SUBLINGUAL | Status: DC | PRN

## 2013-03-17 MED ORDER — METOPROLOL SUCCINATE ER 25 MG PO TB24: 25.0000 mg | ORAL_TABLET | Freq: Every day | ORAL | Status: DC

## 2013-03-17 MED ORDER — TECHNETIUM TC 99M SESTAMIBI GENERIC - CARDIOLITE
30.0000 | Freq: Once | INTRAVENOUS | Status: AC | PRN
  Administered 2013-03-17: 30 via INTRAVENOUS

## 2013-03-17 NOTE — Discharge Summary (Signed)
See progress notes Brian Crenshaw  

## 2013-03-17 NOTE — Progress Notes (Addendum)
   Subjective:  Denies CP or dyspnea; "flash" of chest pain yesterday   Objective:  Filed Vitals:   03/16/13 1400 03/16/13 1513 03/16/13 2100 03/17/13 0500  BP: 107/60 90/55 111/69 120/80  Pulse: 68  57 72  Temp: 97.5 F (36.4 C)  98.7 F (37.1 C) 98.6 F (37 C)  TempSrc: Oral  Oral Oral  Resp:   16 18  Height:      Weight:      SpO2: 96%  96% 95%    Intake/Output from previous day:  Intake/Output Summary (Last 24 hours) at 03/17/13 0716 Last data filed at 03/17/13 0600  Gross per 24 hour  Intake 623.33 ml  Output   3800 ml  Net -3176.67 ml    Physical Exam: Physical exam: Well-developed well-nourished in no acute distress.  Skin is warm and dry.  HEENT is normal.  Neck is supple. Chest is clear to auscultation with normal expansion.  Cardiovascular exam is regular rate and rhythm.  Abdominal exam nontender or distended. No masses palpated. Extremities show no edema. neuro grossly intact    Lab Results: Basic Metabolic Panel:  Recent Labs  45/40/98 2001 03/15/13 0425  NA 143 142  K 4.0 3.4*  CL 106 108  CO2 27 28  GLUCOSE 90 109*  BUN 23 22  CREATININE 0.81 0.78  CALCIUM 9.2 8.5   CBC:  Recent Labs  03/14/13 2001  03/16/13 0625 03/17/13 0525  WBC 5.2  < > 5.5 5.4  NEUTROABS 2.4  --   --   --   HGB 13.1  < > 11.8* 13.2  HCT 38.1  < > 34.2* 39.1  MCV 87.2  < > 87.5 88.3  PLT 178  < > 155 177  < > = values in this interval not displayed. Cardiac Enzymes:  Recent Labs  03/15/13 0902 03/15/13 1359 03/15/13 2050  TROPONINI <0.30 <0.30 <0.30     Assessment/Plan:  1 Chest pain - symptoms atypical; enzymes neg; ECG with no ST changes; pt to complete rest images of myoview today (stress images yesterday); if neg DC and fu with Dr Myrtis Ser. 2 Hyperlipidemia - continue statin 3 Hypertension - continue present meds but decrease toprol to 25 mg po daily as BP borderline.  Olga Millers 03/17/2013, 7:16 AM

## 2013-03-17 NOTE — Discharge Summary (Signed)
Patient ID: Courtney Steele,  MRN: [id number], DOB/AGE: 62-Nov-1952 62 y.o.  Admit date: 03/14/2013 Discharge date: 03/17/2013  Primary Care Provider: Astrid Divine Primary Cardiologist: Lovena Neighbours, MD  Discharge Diagnoses Principal Problem:   Midsternal chest pain  **Low-risk myoview this admission. Active Problems:   CAD (coronary artery disease)   Hypertension   Hyperlipidemia   Fibromyalgia  Allergies Allergies  Allergen Reactions  . Erythromycin Diarrhea  . Indomethacin Diarrhea  . Biaxin (Clarithromycin) Rash  . Penicillins Rash   Procedures  Lexiscan Myoview 03/17/2013  Rest and stress images show small fixed defect in inferoapical and mid-inferior myocardium consistent with old scar. There is no significant reversible ischemia. SDS=1 The LV enddiastolic volume is 86 ml. The LV end systolic volume is 28 ml. The EF is 67%.  There are no segmental wall motion abnormalities. Impression: Old small inferior wall scar.  No significant ischemia.  Normal LV systolic function with no wall motion abnormalities. _____________  History of Present Illness  62 year old female with prior history of coronary artery disease who was in her usual state of health until approximately 2 days prior to admission which began to experience intermittent chest squeezing and tightness with radiation to the jaw, associated with palpitations and dyspnea, lasting a few seconds to minutes, and resolving spontaneously. Because of recurrent symptoms, she presented to the [facility] on the evening of April 5, where initial cardiac markers were negative an ECG was nonacute. She was admitted for further evaluation.  Hospital Course  Patient ruled out for myocardial infarction. She had no further chest discomfort. She underwent a 2 day Lexiscan Myoview over the course of April seventh and eighth, revealing an EF of 67% with an old small inferior wall scar and no significant ischemia. In the setting of his  low risk study, she'll be discharged home today in good condition. Of note, she has been noted to have borderline low blood pressures with associated dizziness while hospitalized. As result, we have discontinued her home dose of amlodipine and reduced her beta blocker dose to 25 mg daily.  Discharge Vitals Blood pressure 120/80, pulse 72, temperature 98.6 F (37 C), temperature source Oral, resp. rate 18, height 5' 2.99" (1.6 m), weight 226 lb 13.7 oz (102.9 kg), SpO2 95.00%.  Filed Weights   03/14/13 2042  Weight: 226 lb 13.7 oz (102.9 kg)   Labs  CBC  Recent Labs  03/14/13 2001  03/16/13 0625 03/17/13 0525  WBC 5.2  < > 5.5 5.4  NEUTROABS 2.4  --   --   --   HGB 13.1  < > 11.8* 13.2  HCT 38.1  < > 34.2* 39.1  MCV 87.2  < > 87.5 88.3  PLT 178  < > 155 177  < > = values in this interval not displayed. Basic Metabolic Panel  Recent Labs  03/14/13 2001 03/15/13 0425  NA 143 142  K 4.0 3.4*  CL 106 108  CO2 27 28  GLUCOSE 90 109*  BUN 23 22  CREATININE 0.81 0.78  CALCIUM 9.2 8.5   Liver Function Tests  Recent Labs  03/14/13 2001  AST 19  ALT 14  ALKPHOS 62  BILITOT 0.7  PROT 6.6  ALBUMIN 3.6   Cardiac Enzymes  Recent Labs  03/15/13 0902 03/15/13 1359 03/15/13 2050  TROPONINI <0.30 <0.30 <0.30   Fasting Lipid Panel  Recent Labs  03/15/13 0435  CHOL 148  HDL 44  LDLCALC 65  TRIG 195*  CHOLHDL 3.4  Disposition  Pt is being discharged home today in good condition.  Follow-up Plans & Appointments  Follow-up Information   Follow up with Willa Rough, MD On 04/08/2013. (12:00 PM)    Contact information:   [address] [place] [address] [phone]      Discharge Medications    Medication List    STOP taking these medications       amLODipine 2.5 MG tablet  Commonly known as:  NORVASC      TAKE these medications       albuterol 90 MCG/ACT inhaler  Commonly known as:  PROVENTIL,VENTOLIN  Inhale 1-2 puffs  into the lungs every 4 (four) hours as needed. For wheezing     aspirin EC 81 MG tablet  Take 81 mg by mouth daily.     benazepril 5 MG tablet  Commonly known as:  LOTENSIN  Take 5 mg by mouth daily.     calcium carbonate 200 MG capsule  Take 250 mg by mouth daily.     clopidogrel 75 MG tablet  Commonly known as:  PLAVIX  Take 75 mg by mouth daily.     cyclobenzaprine 10 MG tablet  Commonly known as:  FLEXERIL  Take 10 mg by mouth 3 (three) times daily as needed. For back pain     DULoxetine 30 MG capsule  Commonly known as:  CYMBALTA  Take 90 mg by mouth daily.     ergocalciferol 50000 UNITS capsule  Commonly known as:  VITAMIN D2  Take 50,000 Units by mouth once a week. Take on Saturday     isosorbide mononitrate 30 MG 24 hr tablet  Commonly known as:  IMDUR  Take 15 mg by mouth daily.     LIPITOR 20 MG tablet  Generic drug:  atorvastatin  Take 1 tablet (20 mg total) by mouth daily.     metoprolol succinate 25 MG 24 hr tablet  Commonly known as:  TOPROL-XL  Take 1 tablet (25 mg total) by mouth daily.     nitroGLYCERIN 0.4 MG SL tablet  Commonly known as:  NITROSTAT  Place 1 tablet (0.4 mg total) under the tongue every 5 (five) minutes as needed. For chest pain     pantoprazole 40 MG tablet  Commonly known as:  PROTONIX  Take 40 mg by mouth daily.     pregabalin 75 MG capsule  Commonly known as:  LYRICA  Take 225 mg by mouth 2 (two) times daily.     ranolazine 500 MG 12 hr tablet  Commonly known as:  RANEXA  Take 2 tablets (1,000 mg total) by mouth 2 (two) times daily.     traMADol 50 MG tablet  Commonly known as:  ULTRAM  Take 50 mg by mouth 3 (three) times daily as needed. For pain       Outstanding Labs/Studies  None  Duration of Discharge Encounter   Greater than 30 minutes including physician time.  Signed, Nicolasa Ducking NP 03/17/2013, 2:09 PM

## 2013-03-18 ENCOUNTER — Telehealth: Payer: Self-pay

## 2013-03-18 NOTE — Telephone Encounter (Signed)
Pt was called to find out how she is doing.  She states she is doing fine.  No questions or concerns.  Having a pvc off and on.  She has all her meds.  She is aware of her f/u appt with Dr Myrtis Ser.  I told her to call if she has any questions or concerns.

## 2013-04-08 ENCOUNTER — Encounter: Payer: 59 | Admitting: Cardiology

## 2013-05-21 ENCOUNTER — Other Ambulatory Visit: Payer: Self-pay

## 2013-05-21 NOTE — Telephone Encounter (Signed)
STOP taking these medications   amLODipine 2.5 MG tablet   Commonly known as:  NORVASC   Discharge Summaries signed by Ok Anis, NP at 03/17/2013  2:19 PM

## 2013-05-30 ENCOUNTER — Encounter: Payer: Self-pay | Admitting: Cardiology

## 2013-05-30 DIAGNOSIS — M1990 Unspecified osteoarthritis, unspecified site: Secondary | ICD-10-CM | POA: Insufficient documentation

## 2013-06-02 ENCOUNTER — Encounter: Payer: Self-pay | Admitting: Cardiology

## 2013-06-02 ENCOUNTER — Ambulatory Visit (INDEPENDENT_AMBULATORY_CARE_PROVIDER_SITE_OTHER): Payer: 59 | Admitting: Cardiology

## 2013-06-02 VITALS — BP 122/78 | HR 83 | Ht 62.5 in | Wt 224.4 lb

## 2013-06-02 DIAGNOSIS — E785 Hyperlipidemia, unspecified: Secondary | ICD-10-CM

## 2013-06-02 DIAGNOSIS — I2510 Atherosclerotic heart disease of native coronary artery without angina pectoris: Secondary | ICD-10-CM

## 2013-06-02 MED ORDER — BENAZEPRIL HCL 5 MG PO TABS: 5.0000 mg | ORAL_TABLET | Freq: Every day | ORAL | Status: DC

## 2013-06-02 MED ORDER — ATORVASTATIN CALCIUM 20 MG PO TABS: 20.0000 mg | ORAL_TABLET | Freq: Every day | ORAL | Status: DC

## 2013-06-02 MED ORDER — RANOLAZINE ER 500 MG PO TB12: 1000.0000 mg | ORAL_TABLET | Freq: Two times a day (BID) | ORAL | Status: DC

## 2013-06-02 MED ORDER — ISOSORBIDE MONONITRATE ER 30 MG PO TB24: 15.0000 mg | ORAL_TABLET | Freq: Every day | ORAL | Status: DC

## 2013-06-02 MED ORDER — METOPROLOL SUCCINATE ER 25 MG PO TB24: 25.0000 mg | ORAL_TABLET | Freq: Every day | ORAL | Status: DC

## 2013-06-02 MED ORDER — PANTOPRAZOLE SODIUM 40 MG PO TBEC: 40.0000 mg | DELAYED_RELEASE_TABLET | Freq: Every day | ORAL | Status: DC

## 2013-06-02 MED ORDER — AMLODIPINE BESYLATE 2.5 MG PO TABS: 2.5000 mg | ORAL_TABLET | Freq: Every day | ORAL | Status: DC

## 2013-06-02 MED ORDER — NITROGLYCERIN 0.4 MG SL SUBL: 0.4000 mg | SUBLINGUAL_TABLET | SUBLINGUAL | Status: DC | PRN

## 2013-06-02 MED ORDER — CLOPIDOGREL BISULFATE 75 MG PO TABS: 75.0000 mg | ORAL_TABLET | Freq: Every day | ORAL | Status: DC

## 2013-06-02 NOTE — Assessment & Plan Note (Addendum)
At the time of her recent labs her LDL was 65. Lipitor is 20 mg. Most recent guidelines recommend 40 mg for patients with proven coronary disease regardless of their LDL. However this patient has significant fibromyalgia. If the weather changes she has significant aches and pains. We have shown over time the 20 mg of Lipitor does not affect his pattern. I think it is prudent not to increase her Lipitor at this time. She will be having labs in the near future. If she does not remain at the prior goal of lower than 80 LDL, then I would be in favor of increasing her Lipitor dose to 40.

## 2013-06-02 NOTE — Patient Instructions (Addendum)
Your physician recommends that you continue on your current medications as directed. Please refer to the Current Medication list given to you today.  Your physician wants you to follow-up in: 6 months. You will receive a reminder letter in the mail two months in advance. If you don't receive a letter, please call our office to schedule the follow-up appointment.  

## 2013-06-02 NOTE — Progress Notes (Signed)
HPI   Patient is seen in followup coronary disease. She's doing well. She was hospitalized in April, 2014. Nuclear scan showed no ischemia and a normal ejection fraction. She was discharged home in she's been stable since.  Allergies  Allergen Reactions  . Erythromycin Diarrhea  . Indomethacin Diarrhea  . Biaxin (Clarithromycin) Rash  . Penicillins Rash    Current Outpatient Prescriptions  Medication Sig Dispense Refill  . amLODipine (NORVASC) 2.5 MG tablet Take 2.5 mg by mouth daily.      Marland Kitchen aspirin EC 81 MG tablet Take 81 mg by mouth daily.      Marland Kitchen atorvastatin (LIPITOR) 20 MG tablet Take 1 tablet (20 mg total) by mouth daily.      . benazepril (LOTENSIN) 5 MG tablet Take 5 mg by mouth daily.        . calcium carbonate 200 MG capsule Take 250 mg by mouth daily.        . clopidogrel (PLAVIX) 75 MG tablet Take 75 mg by mouth daily.      . cyclobenzaprine (FLEXERIL) 10 MG tablet Take 10 mg by mouth 3 (three) times daily as needed. For back pain      . DULoxetine (CYMBALTA) 30 MG capsule Take 90 mg by mouth daily.       . ergocalciferol (VITAMIN D2) 50000 UNITS capsule Take 50,000 Units by mouth once a week. Take on Saturday      . isosorbide mononitrate (IMDUR) 30 MG 24 hr tablet Take 15 mg by mouth daily.      . metoprolol succinate (TOPROL-XL) 25 MG 24 hr tablet Take 1 tablet (25 mg total) by mouth daily.  30 tablet  6  . nitroGLYCERIN (NITROSTAT) 0.4 MG SL tablet Place 1 tablet (0.4 mg total) under the tongue every 5 (five) minutes as needed. For chest pain  25 tablet  3  . pantoprazole (PROTONIX) 40 MG tablet Take 40 mg by mouth daily.      . pregabalin (LYRICA) 75 MG capsule Take 225 mg by mouth 2 (two) times daily.       . ranolazine (RANEXA) 500 MG 12 hr tablet Take 2 tablets (1,000 mg total) by mouth 2 (two) times daily.  360 tablet  2  . traMADol (ULTRAM) 50 MG tablet Take 50 mg by mouth 3 (three) times daily as needed. For pain      . albuterol (PROVENTIL,VENTOLIN) 90 MCG/ACT  inhaler Inhale 1-2 puffs into the lungs every 4 (four) hours as needed. For wheezing       No current facility-administered medications for this visit.    History   Social History  . Marital Status: Married    Spouse Name: N/A    Number of Children: 2  . Years of Education: N/A   Occupational History  . [employer]   Social History Main Topics  . Smoking status: Never Smoker   . Smokeless tobacco: Never Used  . Alcohol Use: Yes     Comment: occasional  . Drug Use: No  . Sexually Active: Not on file   Other Topics Concern  . Not on file   Social History Narrative  . No narrative on file    Family History  Problem Relation Age of Onset  . Other Mother     died pulmonary issues  . Coronary artery disease Father 3    died- CABG-HTN- AAA    Past Medical History  Diagnosis Date  . CAD (coronary artery disease)   .  Hypertension   . Hyperlipidemia   . Borderline diabetes   . Morbid obesity   . Fibromyalgia   . Osteoarthritis   . Degenerative joint disease   . Groin hematoma   . Hematoma     Radial catheter site July 13, 2011  . Diabetes mellitus     Past Surgical History  Procedure Laterality Date  . Cardiac catheterization      revealing patency of the LAD stent with mild compromise of  the diagonal and moderate mid LAD stenosis, but does not appear to  be high grade.   . Cholecystectomy    . Tonsillectomy    . Carpal tunnel release    . Bunionectomy    . Knee arthroscopy    . Abdominal hysterectomy    . Angioplasty      Patient Active Problem List   Diagnosis Date Noted  . Osteoarthritis   . Midsternal chest pain 02/23/2012  . Dizziness 10/29/2011  . CAD (coronary artery disease)   . Hypertension   . Hyperlipidemia   . Fibromyalgia   . Groin hematoma   . Hematoma     ROS   Patient denies fever, chills, headache, sweats, rash, change in vision, change in hearing, chest pain, cough, nausea vomiting, urinary symptoms. All other  systems are reviewed and are negative.  PHYSICAL EXAM  Patient is overweight. She is oriented to person time and place. Affect is normal. There is no jugulovenous distention. Lungs are clear. Respiratory effort is nonlabored. Cardiac exam reveals S1 and S2. There no clicks or significant murmurs. The abdomen is soft. There is no peripheral edema.  Filed Vitals:   06/02/13 1502  BP: 122/78  Pulse: 83  Height: 5' 2.5" (1.588 m)  Weight: 224 lb 6.4 oz (101.787 kg)  SpO2: 97%     ASSESSMENT & PLAN

## 2013-06-02 NOTE — Assessment & Plan Note (Signed)
Coronary disease is stable. No change in therapy. 

## 2013-08-03 ENCOUNTER — Emergency Department (INDEPENDENT_AMBULATORY_CARE_PROVIDER_SITE_OTHER): Payer: 59

## 2013-08-03 ENCOUNTER — Emergency Department (INDEPENDENT_AMBULATORY_CARE_PROVIDER_SITE_OTHER)
Admission: EM | Admit: 2013-08-03 | Discharge: 2013-08-03 | Disposition: A | Discharge status: Home / Self Care | Payer: 59 | Source: Home / Self Care | Attending: Family Medicine | Admitting: Family Medicine

## 2013-08-03 ENCOUNTER — Encounter (HOSPITAL_COMMUNITY): Payer: Self-pay

## 2013-08-03 DIAGNOSIS — J45901 Unspecified asthma with (acute) exacerbation: Secondary | ICD-10-CM

## 2013-08-03 DIAGNOSIS — R059 Cough, unspecified: Secondary | ICD-10-CM

## 2013-08-03 DIAGNOSIS — R05 Cough: Secondary | ICD-10-CM

## 2013-08-03 MED ORDER — BENZONATATE 100 MG PO CAPS: 100.0000 mg | ORAL_CAPSULE | Freq: Three times a day (TID) | ORAL | Status: DC

## 2013-08-03 MED ORDER — DOXYCYCLINE HYCLATE 100 MG PO CAPS: 100.0000 mg | ORAL_CAPSULE | Freq: Two times a day (BID) | ORAL | Status: DC

## 2013-08-03 MED ORDER — ALBUTEROL SULFATE HFA 108 (90 BASE) MCG/ACT IN AERS: 1.0000 | INHALATION_SPRAY | Freq: Four times a day (QID) | RESPIRATORY_TRACT | Status: DC | PRN

## 2013-08-03 NOTE — ED Notes (Addendum)
Cough  onset Friday AM , developed productive cough Friday PM (fever 99.4). Started prednisone 50 QD Saturday and nasal spray fluticasone BID. Fatigue, dyspnea w exertion

## 2013-08-03 NOTE — ED Provider Notes (Signed)
CSN: [id number]     Arrival date & time 08/03/13  1107 History     First MD Initiated Contact with Patient 08/03/13 1155     Chief Complaint  Patient presents with  . Cough   (Consider location/radiation/quality/duration/timing/severity/associated sxs/prior Treatment) HPI Comments: 62 year old female nonsmoker with history of asthma and diet controlled diabetes among other comorbidities. Here complaining of persistent cough spells, nonproductive and associated with intermittent wheezing and shortness of breath for about 4 days. Symptoms also associated with nasal congestion, postnasal drip without rhinorrhea. No fever or chills. Has taken prednisone 50 mg for 2 days. Also using fluticasone nasal spray and albuterol inhaler without significant relief. No leg swelling. Denies chest pain, abdominal pain, nausea vomiting or diarrhea.   Past Medical History  Diagnosis Date  . CAD (coronary artery disease)   . Hypertension   . Hyperlipidemia   . Borderline diabetes   . Morbid obesity   . Fibromyalgia   . Osteoarthritis   . Degenerative joint disease   . Groin hematoma   . Hematoma     Radial catheter site July 13, 2011  . Diabetes mellitus    Past Surgical History  Procedure Laterality Date  . Cardiac catheterization      revealing patency of the LAD stent with mild compromise of  the diagonal and moderate mid LAD stenosis, but does not appear to  be high grade.   . Cholecystectomy    . Tonsillectomy    . Carpal tunnel release    . Bunionectomy    . Knee arthroscopy    . Abdominal hysterectomy    . Angioplasty     Family History  Problem Relation Age of Onset  . Other Mother     died pulmonary issues  . Coronary artery disease Father 3    died- CABG-HTN- AAA   History  Substance Use Topics  . Smoking status: Never Smoker   . Smokeless tobacco: Never Used  . Alcohol Use: Yes     Comment: occasional   OB History   Grav Para Term Preterm Abortions TAB SAB Ect Mult  Living                 Review of Systems  Constitutional: Negative for fever, chills, diaphoresis and appetite change.  HENT: Positive for congestion and postnasal drip. Negative for sore throat and trouble swallowing.   Eyes: Negative for discharge.  Respiratory: Positive for cough, shortness of breath and wheezing.   Cardiovascular: Negative for chest pain and leg swelling.  Gastrointestinal: Negative for nausea, vomiting, abdominal pain and diarrhea.  Skin: Negative for rash.  Neurological: Negative for dizziness and headaches.    Allergies  Erythromycin; Indomethacin; Biaxin; and Penicillins  Home Medications   Current Outpatient Rx  Name  Route  Sig  Dispense  Refill  . albuterol (PROVENTIL HFA;VENTOLIN HFA) 108 (90 BASE) MCG/ACT inhaler   Inhalation   Inhale 1-2 puffs into the lungs every 6 (six) hours as needed for wheezing.   1 Inhaler   0   . amLODipine (NORVASC) 2.5 MG tablet   Oral   Take 1 tablet (2.5 mg total) by mouth daily.   90 tablet   3   . aspirin EC 81 MG tablet   Oral   Take 81 mg by mouth daily.         Marland Kitchen atorvastatin (LIPITOR) 20 MG tablet   Oral   Take 1 tablet (20 mg total) by mouth daily.   90 tablet  3   . benazepril (LOTENSIN) 5 MG tablet   Oral   Take 1 tablet (5 mg total) by mouth daily.   90 tablet   3   . benzonatate (TESSALON) 100 MG capsule   Oral   Take 1 capsule (100 mg total) by mouth every 8 (eight) hours.   21 capsule   0   . calcium carbonate 200 MG capsule   Oral   Take 250 mg by mouth daily.           . clopidogrel (PLAVIX) 75 MG tablet   Oral   Take 1 tablet (75 mg total) by mouth daily.   90 tablet   3   . cyclobenzaprine (FLEXERIL) 10 MG tablet   Oral   Take 10 mg by mouth 3 (three) times daily as needed. For back pain         . doxycycline (VIBRAMYCIN) 100 MG capsule   Oral   Take 1 capsule (100 mg total) by mouth 2 (two) times daily.   20 capsule   0     Fill is persistent or worsening  symptoms after 7 d ...   . DULoxetine (CYMBALTA) 30 MG capsule   Oral   Take 90 mg by mouth daily.          . ergocalciferol (VITAMIN D2) 50000 UNITS capsule   Oral   Take 50,000 Units by mouth once a week. Take on Saturday         . isosorbide mononitrate (IMDUR) 30 MG 24 hr tablet   Oral   Take 0.5 tablets (15 mg total) by mouth daily.   90 tablet   3   . metoprolol succinate (TOPROL-XL) 25 MG 24 hr tablet   Oral   Take 1 tablet (25 mg total) by mouth daily.   90 tablet   3   . nitroGLYCERIN (NITROSTAT) 0.4 MG SL tablet   Sublingual   Place 1 tablet (0.4 mg total) under the tongue every 5 (five) minutes as needed. For chest pain   25 tablet   3   . pantoprazole (PROTONIX) 40 MG tablet   Oral   Take 1 tablet (40 mg total) by mouth daily.   90 tablet   3   . pregabalin (LYRICA) 75 MG capsule   Oral   Take 225 mg by mouth 2 (two) times daily.          . ranolazine (RANEXA) 500 MG 12 hr tablet   Oral   Take 2 tablets (1,000 mg total) by mouth 2 (two) times daily.   360 tablet   3   . traMADol (ULTRAM) 50 MG tablet   Oral   Take 50 mg by mouth 3 (three) times daily as needed. For pain          BP 98/59  Pulse 77  Temp(Src) 98.7 F (37.1 C) (Oral)  Resp 16  SpO2 97% Physical Exam  Nursing note and vitals reviewed. Constitutional: She is oriented to person, place, and time. She appears well-developed and well-nourished. No distress.  HENT:  Head: Normocephalic and atraumatic.  Right Ear: External ear normal.  Left Ear: External ear normal.  Mouth/Throat: Oropharynx is clear and moist. No oropharyngeal exudate.  Nasal congestion.  Eyes: Conjunctivae are normal. Right eye exhibits no discharge. Left eye exhibits no discharge. No scleral icterus.  Neck: No JVD present.  Cardiovascular: Normal rate, regular rhythm and normal heart sounds.  Exam reveals no gallop and no friction  rub.   No murmur heard. No LEE bilaterally   Pulmonary/Chest: Effort  normal. No respiratory distress. She has no wheezes. She exhibits no tenderness.  Impress few fine crackles in left base that faded after repetitive deep inspiration. Otherwise normal lung exam.   Lymphadenopathy:    She has no cervical adenopathy.  Neurological: She is alert and oriented to person, place, and time.  Skin: No rash noted. She is not diaphoretic.    ED Course   Procedures (including critical care time)  Labs Reviewed - No data to display Dg Chest 2 View  08/03/2013   *RADIOLOGY REPORT*  Clinical Data: Cough, congestion, fever and wheezing.  CHEST - 2 VIEW  Comparison: 03/14/2013.  Findings: Trachea is midline.  Heart size normal.  Lungs are clear. No pleural fluid.  IMPRESSION: No acute findings.   Original Report Authenticated By: Leanna Battles, M.D.   1. Cough   2. Asthma exacerbation, mild     MDM  Continue prednisone to 5 days completion. Refilled albuterol. Prescribed tessalon Perles.  Provided a hold prescription for doxycyline to fill only if persistent or worsening symptoms after 7 days.  Reccommended otc zyrtec. Red flags for signs of GERD and CHF discussed with patient if persistent cough and fatigue on exertion. Supportive care and red flags that should prompt her return to medical attention discussed with patient and provided in writing.     Sharin Grave, MD 08/04/13 1101

## 2013-09-04 ENCOUNTER — Emergency Department (HOSPITAL_COMMUNITY)
Admission: EM | Admit: 2013-09-04 | Discharge: 2013-09-04 | Disposition: A | Discharge status: Home / Self Care | Payer: 59 | Source: Home / Self Care | Attending: Emergency Medicine | Admitting: Emergency Medicine

## 2013-09-04 ENCOUNTER — Encounter (HOSPITAL_COMMUNITY): Payer: Self-pay | Admitting: *Deleted

## 2013-09-04 DIAGNOSIS — K5732 Diverticulitis of large intestine without perforation or abscess without bleeding: Secondary | ICD-10-CM

## 2013-09-04 DIAGNOSIS — K5792 Diverticulitis of intestine, part unspecified, without perforation or abscess without bleeding: Secondary | ICD-10-CM

## 2013-09-04 MED ORDER — METRONIDAZOLE 500 MG PO TABS: 500.0000 mg | ORAL_TABLET | Freq: Three times a day (TID) | ORAL | Status: DC

## 2013-09-04 MED ORDER — CIPROFLOXACIN HCL 500 MG PO TABS: 500.0000 mg | ORAL_TABLET | Freq: Two times a day (BID) | ORAL | Status: DC

## 2013-09-04 NOTE — Discharge Instructions (Signed)
Diverticulitis °A diverticulum is a small pouch or sac on the colon. Diverticulosis is the presence of these diverticula on the colon. Diverticulitis is the irritation (inflammation) or infection of diverticula. °CAUSES  °The colon and its diverticula contain bacteria. If food particles block the tiny opening to a diverticulum, the bacteria inside can grow and cause an increase in pressure. This leads to infection and inflammation and is called diverticulitis. °SYMPTOMS  °· Abdominal pain and tenderness. Usually, the pain is located on the left side of your abdomen. However, it could be located elsewhere. °· Fever. °· Bloating. °· Feeling sick to your stomach (nausea). °· Throwing up (vomiting). °· Abnormal stools. °DIAGNOSIS  °Your caregiver will take a history and perform a physical exam. Since many things can cause abdominal pain, other tests may be necessary. Tests may include: °· Blood tests. °· Urine tests. °· X-ray of the abdomen. °· CT scan of the abdomen. °Sometimes, surgery is needed to determine if diverticulitis or other conditions are causing your symptoms. °TREATMENT  °Most of the time, you can be treated without surgery. Treatment includes: °· Resting the bowels by only having liquids for a few days. As you improve, you will need to eat a low-fiber diet. °· Intravenous (IV) fluids if you are losing body fluids (dehydrated). °· Antibiotic medicines that treat infections may be given. °· Pain and nausea medicine, if needed. °· Surgery if the inflamed diverticulum has burst. °HOME CARE INSTRUCTIONS  °· Try a clear liquid diet (broth, tea, or water for as long as directed by your caregiver). You may then gradually begin a low-fiber diet as tolerated.  °A low-fiber diet is a diet with less than 10 grams of fiber. Choose the foods below to reduce fiber in the diet: °· White breads, cereals, rice, and pasta. °· Cooked fruits and vegetables or soft fresh fruits and vegetables without the skin. °· Ground or  well-cooked tender beef, ham, veal, lamb, pork, or poultry. °· Eggs and seafood. °· After your diverticulitis symptoms have improved, your caregiver may put you on a high-fiber diet. A high-fiber diet includes 14 grams of fiber for every 1000 calories consumed. For a standard 2000 calorie diet, you would need 28 grams of fiber. Follow these diet guidelines to help you increase the fiber in your diet. It is important to slowly increase the amount fiber in your diet to avoid gas, constipation, and bloating. °· Choose whole-grain breads, cereals, pasta, and brown rice. °· Choose fresh fruits and vegetables with the skin on. Do not overcook vegetables because the more vegetables are cooked, the more fiber is lost. °· Choose more nuts, seeds, legumes, dried peas, beans, and lentils. °· Look for food products that have greater than 3 grams of fiber per serving on the Nutrition Facts label. °· Take all medicine as directed by your caregiver. °· If your caregiver has given you a follow-up appointment, it is very important that you go. Not going could result in lasting (chronic) or permanent injury, pain, and disability. If there is any problem keeping the appointment, call to reschedule. °SEEK MEDICAL CARE IF:  °· Your pain does not improve. °· You have a hard time advancing your diet beyond clear liquids. °· Your bowel movements do not return to normal. °SEEK IMMEDIATE MEDICAL CARE IF:  °· Your pain becomes worse. °· You have an oral temperature above 102° F (38.9° C), not controlled by medicine. °· You have repeated vomiting. °· You have bloody or black, tarry stools. °·   Symptoms that brought you to your caregiver become worse or are not getting better. °MAKE SURE YOU:  °· Understand these instructions. °· Will watch your condition. °· Will get help right away if you are not doing well or get worse. °Document Released: 09/05/2005 Document Revised: 02/18/2012 Document Reviewed: 01/01/2011 °[employer]® Patient Information  ©2014 [employer]. ° °

## 2013-09-04 NOTE — ED Provider Notes (Signed)
Chief Complaint:   Chief Complaint  Patient presents with  . Abdominal Pain    History of Present Illness:    Courtney Steele is a 62 year old female who's had a one-week history of left lower quadrant pain. This seems to be fairly stable and doesn't seem to be getting worse or better. She's had a history of diverticulitis in the past in the same area and this is what it feels like. She denies any fever, chills, nausea, or vomiting. Her appetite is good. No constipation, diarrhea, or blood in the stool and no urinary symptoms. Her symptoms have responded well to Cipro and metronidazole the past.  Review of Systems:  Other than noted above, the patient denies any of the following symptoms: Constitutional:  No fever, chills, fatigue, weight loss or anorexia. Lungs:  No cough or shortness of breath. Heart:  No chest pain, palpitations, syncope or edema.  No cardiac history. Abdomen:  No nausea, vomiting, hematememesis, melena, diarrhea, or hematochezia. GU:  No dysuria, frequency, urgency, or hematuria. Gyn:  No vaginal discharge, itching, abnormal bleeding, dyspareunia, or pelvic pain.  PMFSH:  Past medical history, family history, social history, meds, and allergies were reviewed along with nurse's notes.  No prior abdominal surgeries, past history of GI problems, STDs or GYN problems.  No history of aspirin or NSAID use.  No excessive alcohol intake.   Physical Exam:   Vital signs:  BP 126/52  Pulse 79  Temp(Src) 98.5 F (36.9 C) (Oral)  SpO2 95% Gen:  Alert, oriented, in no distress. Lungs:  Breath sounds clear and equal bilaterally.  No wheezes, rales or rhonchi. Heart:  Regular rhythm.  No gallops or murmers.   Abdomen:  She has moderate tenderness to palpation in the left lower quadrant. No guarding or rebound. No organomegaly or mass. Bowel sounds are normally active. Skin:  Clear, warm and dry.  No rash.  Assessment:  The encounter diagnosis was Diverticulitis.  There is no  evidence of peritonitis or an abscess, thus I think she would be a good candidate for outpatient treatment.  Plan:   1.  Meds:  The following meds were prescribed:   Discharge Medication List as of 09/04/2013  5:35 PM    START taking these medications   Details  ciprofloxacin (CIPRO) 500 MG tablet Take 1 tablet (500 mg total) by mouth every 12 (twelve) hours., Starting 09/04/2013, Until Discontinued, Normal    metroNIDAZOLE (FLAGYL) 500 MG tablet Take 1 tablet (500 mg total) by mouth 3 (three) times daily., Starting 09/04/2013, Until Discontinued, Normal        2.  Patient Education/Counseling:  The patient was given appropriate handouts, self care instructions, and instructed in symptomatic relief.  Suggesting a light diet, possibly the DASH diet.  3.  Follow up:  The patient was told to follow up if no better in 3 to 4 days, if becoming worse in any way, and given some red flag symptoms such as worsening pain, fever, or any rectal bleeding which would prompt immediate return.  Follow up at the emergency department if she should become worse.    Reuben Likes, MD 09/04/13 2222

## 2013-09-24 ENCOUNTER — Ambulatory Visit (INDEPENDENT_AMBULATORY_CARE_PROVIDER_SITE_OTHER): Payer: 59 | Admitting: Podiatry

## 2013-09-24 ENCOUNTER — Ambulatory Visit (INDEPENDENT_AMBULATORY_CARE_PROVIDER_SITE_OTHER): Payer: 59

## 2013-09-24 ENCOUNTER — Encounter: Payer: Self-pay | Admitting: Podiatry

## 2013-09-24 VITALS — BP 120/68 | HR 86 | Resp 12 | Ht 63.0 in | Wt 220.0 lb

## 2013-09-24 DIAGNOSIS — M722 Plantar fascial fibromatosis: Secondary | ICD-10-CM

## 2013-09-24 DIAGNOSIS — M7730 Calcaneal spur, unspecified foot: Secondary | ICD-10-CM

## 2013-09-24 DIAGNOSIS — M79609 Pain in unspecified limb: Secondary | ICD-10-CM

## 2013-09-24 DIAGNOSIS — M79671 Pain in right foot: Secondary | ICD-10-CM

## 2013-09-24 MED ORDER — TRIAMCINOLONE ACETONIDE 10 MG/ML IJ SUSP
5.0000 mg | Freq: Once | INTRAMUSCULAR | Status: AC
  Administered 2013-09-24: 5 mg via INTRA_ARTICULAR

## 2013-09-24 NOTE — Progress Notes (Signed)
Subjective:     Patient ID: Courtney Steele, female   DOB: [birth year], 62 y.o.   MRN: [id number]  HPI patient states I've been having a lot of pain in my right heel for the last several weeks. States she has tried ibuprofen and taping it and reducing activity   Review of Systems  All other systems reviewed and are negative.       Objective:   Physical Exam  Nursing note and vitals reviewed. Constitutional: She appears well-developed and well-nourished.  Cardiovascular: Intact distal pulses.   Neurological: She is alert.  Skin: Skin is warm.   pain noted in the plantar fascia right at the insertional area of the calcaneus and tendon     Assessment:     Plantar fasciitis right with acute manifestations    Plan:     H&P an x-ray done. Injected in the right plantar fascia 3 mg Kenalog 5 mg Xylocaine Marcaine mixture

## 2013-09-24 NOTE — Progress Notes (Signed)
N  SORE L  RT. FOOT ARCH AND HEEL D  2 WEEKS O  SLOWLY C  WORSE A  PRESSURE T  TAPE, IBUFROPEN

## 2013-09-30 ENCOUNTER — Ambulatory Visit: Payer: 59 | Admitting: Podiatry

## 2013-10-01 ENCOUNTER — Telehealth: Payer: Self-pay

## 2013-10-01 NOTE — Telephone Encounter (Signed)
**Note De-Identified Teo Moede Obfuscation** The pt states that she has developed a bad dry cough over the past several months that wont go away. She thought it maybe due to Benazepril so she stop taking it for a couple of days and her coughing stopped and when she resumed the coughing started again. She wants to know if she can take something else in place of benazepril. Please advise.

## 2013-10-01 NOTE — Telephone Encounter (Signed)
New problem    Patient think she develop an allergies  to one of her medication.  - benazpril

## 2013-10-08 ENCOUNTER — Ambulatory Visit (INDEPENDENT_AMBULATORY_CARE_PROVIDER_SITE_OTHER): Payer: 59 | Admitting: Podiatry

## 2013-10-08 ENCOUNTER — Encounter: Payer: Self-pay | Admitting: Podiatry

## 2013-10-08 VITALS — BP 136/77 | HR 76 | Resp 16

## 2013-10-08 DIAGNOSIS — M722 Plantar fascial fibromatosis: Secondary | ICD-10-CM

## 2013-10-08 NOTE — Progress Notes (Signed)
Pt states right heel is still painful, except when taped.

## 2013-10-08 NOTE — Patient Instructions (Signed)
You may need lift on opposite foot to balance better

## 2013-10-09 ENCOUNTER — Encounter: Payer: Self-pay | Admitting: Cardiology

## 2013-10-09 NOTE — Progress Notes (Signed)
Pt advised, she verbalized understanding and states she will monitor her BP and call office in a week or so to report her BP readings.

## 2013-10-09 NOTE — Progress Notes (Signed)
**Note De-identified Zaleigh Bermingham Obfuscation**   **Note De-Identified Terreon Ekholm Obfuscation** Patient ID: Courtney Steele, female    DOB: 1951/07/12, 62 y.o.   MRN: [id number]  HPI    Review of Systems    Physical Exam

## 2013-10-09 NOTE — Progress Notes (Signed)
Patient is having a cough with very low-dose benazepril. I reviewed the records. She may not need this medication at all. We're instructing her to stop her benazepril and remain off it. She will check her blood pressure and call us. I will then make further decisions about her meds.

## 2013-10-09 NOTE — Telephone Encounter (Signed)
For now let's just stay off benazepril. Have her check her blood pressure a few times weekly over the next few weeks. Then call us with a blood pressure results.

## 2013-10-11 NOTE — Progress Notes (Signed)
Subjective:     Patient ID: Courtney Steele, female   DOB: Aug 13, 1951, 62 y.o.   MRN: [id number]  HPI patient states I am still having a lot of pain in my right heel. States it is worse when she does any walking and it throbs quite a bit   Review of Systems  All other systems reviewed and are negative.       Objective:   Physical Exam  Nursing note and vitals reviewed. Constitutional: She is oriented to person, place, and time.  Cardiovascular: Intact distal pulses.   Musculoskeletal: Normal range of motion.  Neurological: She is oriented to person, place, and time.   patient's right plantar heel is very tender when pressed and is making it difficult for her to walk on.     Assessment:     Acute plantar fasciitis right heel that is failing so far to respond to conservative care with low possibility of stress fracture    Plan:     Education concerning condition given and x-ray reviewed. I dispensed a pneumatic air fracture walker to reduce all pressures on the heel and explained usage of this for the next 3 weeks. Reappoint at that time

## 2013-10-15 ENCOUNTER — Other Ambulatory Visit: Payer: Self-pay

## 2013-10-29 ENCOUNTER — Encounter: Payer: Self-pay | Admitting: Podiatry

## 2013-10-29 ENCOUNTER — Ambulatory Visit (INDEPENDENT_AMBULATORY_CARE_PROVIDER_SITE_OTHER): Payer: 59 | Admitting: Podiatry

## 2013-10-29 VITALS — BP 136/77 | HR 80 | Resp 18 | Ht 64.0 in | Wt 215.0 lb

## 2013-10-29 DIAGNOSIS — M7750 Other enthesopathy of unspecified foot: Secondary | ICD-10-CM

## 2013-10-29 DIAGNOSIS — M722 Plantar fascial fibromatosis: Secondary | ICD-10-CM

## 2013-10-29 MED ORDER — TRIAMCINOLONE ACETONIDE 10 MG/ML IJ SUSP
5.0000 mg | Freq: Once | INTRAMUSCULAR | Status: AC
  Administered 2013-10-29: 5 mg via INTRA_ARTICULAR

## 2013-10-30 NOTE — Progress Notes (Signed)
Subjective:     Patient ID: Courtney Steele, female   DOB: [birth year], 62 y.o.   MRN: [id number]  HPI patient states that I have mild improvement but continued to have a lot of discomfort in the plantar aspect of heel left and right with inability to walk comfortably especially on cement floors. States pain is still worse in the morning and after periods   Review of Systems     Objective:   Physical Exam Neurovascular status intact with patient being well oriented x3. Pain in the plantar aspect the heel with inflammation and fluid around the calcaneus    Assessment:     Plantar fasciitis noted heel region    Plan:     Reviewed condition and discussed the continuation of conservative care. At this time due to the elongation of symptoms I did dispensed night splint with all instructions on usage and advised on long-term orthotics to reduce stress against the heel and arch. Scanned for customized orthotics and do to acute pain in the right heel I injected 3 mg Kenalog 5 mg Xylocaine Marcaine mixture

## 2013-11-18 ENCOUNTER — Telehealth: Payer: Self-pay | Admitting: *Deleted

## 2013-11-18 NOTE — Telephone Encounter (Signed)
ok 

## 2013-11-18 NOTE — Telephone Encounter (Signed)
Pt states the big boot is throwing oout her right knee and off her balance.  She states she did not wear it today, and is going to her primary physician 11/19/2013.  I told her to go in to an anthletic style shoe, ice and wear nightsplint as much as possible.  Pt agreed.

## 2013-11-25 ENCOUNTER — Ambulatory Visit: Payer: 59 | Attending: Family Medicine | Admitting: Physical Therapy

## 2013-11-25 DIAGNOSIS — R42 Dizziness and giddiness: Secondary | ICD-10-CM | POA: Insufficient documentation

## 2013-11-25 DIAGNOSIS — H8110 Benign paroxysmal vertigo, unspecified ear: Secondary | ICD-10-CM | POA: Insufficient documentation

## 2013-11-25 DIAGNOSIS — IMO0001 Reserved for inherently not codable concepts without codable children: Secondary | ICD-10-CM | POA: Insufficient documentation

## 2013-11-25 DIAGNOSIS — R269 Unspecified abnormalities of gait and mobility: Secondary | ICD-10-CM | POA: Insufficient documentation

## 2013-12-02 ENCOUNTER — Encounter: Payer: 59 | Admitting: Physical Therapy

## 2013-12-09 ENCOUNTER — Encounter: Payer: 59 | Admitting: Physical Therapy

## 2013-12-17 ENCOUNTER — Encounter: Payer: 59 | Admitting: Physical Therapy

## 2013-12-31 ENCOUNTER — Encounter: Payer: Self-pay | Admitting: Podiatry

## 2013-12-31 ENCOUNTER — Ambulatory Visit (INDEPENDENT_AMBULATORY_CARE_PROVIDER_SITE_OTHER): Payer: 59 | Admitting: Podiatry

## 2013-12-31 VITALS — BP 147/82 | HR 68 | Resp 16

## 2013-12-31 DIAGNOSIS — M722 Plantar fascial fibromatosis: Secondary | ICD-10-CM

## 2013-12-31 NOTE — Patient Instructions (Signed)

## 2013-12-31 NOTE — Progress Notes (Signed)
Subjective:     Patient ID: Courtney Steele, female   DOB: [birth year], 63 y.o.   MRN: [id number]  HPI patient presents stating my heel is feeling better and I am here to pick up my orthotics   Review of Systems     Objective:   Physical Exam Neurovascular status intact with no help history issues and pain in the plantar heel right significantly improve    Assessment:     Plantar fasciitis improved right heel with physical therapy and numerous modalities we have use    Plan:     Instructed on physical therapy and dispensed orthotics today with all instructions on usage. Reappoint in 6 weeks

## 2014-01-26 ENCOUNTER — Ambulatory Visit: Payer: 59 | Admitting: Cardiology

## 2014-02-11 ENCOUNTER — Encounter: Payer: Self-pay | Admitting: Podiatry

## 2014-02-11 ENCOUNTER — Ambulatory Visit (INDEPENDENT_AMBULATORY_CARE_PROVIDER_SITE_OTHER): Payer: 59 | Admitting: Podiatry

## 2014-02-11 VITALS — BP 147/76 | HR 82 | Resp 16

## 2014-02-11 DIAGNOSIS — M722 Plantar fascial fibromatosis: Secondary | ICD-10-CM

## 2014-02-11 MED ORDER — TRIAMCINOLONE ACETONIDE 10 MG/ML IJ SUSP
10.0000 mg | Freq: Once | INTRAMUSCULAR | Status: AC
  Administered 2014-02-11: 10 mg

## 2014-02-11 NOTE — Progress Notes (Signed)
Subjective:     Patient ID: Courtney Steele, female   DOB: September 06, 1951, 63 y.o.   MRN: [id number]  HPI patient states my right heel has started to hurt me again and I have not been using my night splint as I was supposed   Review of Systems     Objective:   Physical Exam Neurovascular status intact with no health history changes noted and quite a bit of discomfort distal to the plantar fascial insertion right calcaneus with significant flatfoot deformity noted    Assessment:     Plantar fasciitis right heel with inflammation and fluid around the medial band in a more distal direction    Plan:     Instructed on the importance of wearing her night splint and reviewed how I want her to use it and went ahead and did a more distal injection 3 mg Kenalog 5 of Xylocaine Marcaine mixture and advised on boot usage at this time

## 2014-03-04 ENCOUNTER — Ambulatory Visit (INDEPENDENT_AMBULATORY_CARE_PROVIDER_SITE_OTHER): Payer: 59 | Admitting: Podiatry

## 2014-03-04 ENCOUNTER — Encounter: Payer: Self-pay | Admitting: Podiatry

## 2014-03-04 VITALS — BP 129/65 | HR 87 | Resp 16

## 2014-03-04 DIAGNOSIS — M722 Plantar fascial fibromatosis: Secondary | ICD-10-CM

## 2014-03-05 NOTE — Progress Notes (Signed)
Subjective:     Patient ID: Courtney Steele, female   DOB: Apr 15, 1951, 63 y.o.   MRN: [id number]  HPI patient states my right heel is doing pretty good with pain if I am on it for to long of a time   Review of Systems     Objective:   Physical Exam Neurovascular status intact with significant diminishment of discomfort right plantar heel and night splint being of good usage    Assessment:     Improving plantar fasciitis right    Plan:     Instructed on continuation of physical therapy orthotics selective shoe usage and ice therapy. Reappoint for [place] to recheck

## 2014-03-16 ENCOUNTER — Encounter: Payer: Self-pay | Admitting: Cardiology

## 2014-03-16 ENCOUNTER — Ambulatory Visit (INDEPENDENT_AMBULATORY_CARE_PROVIDER_SITE_OTHER): Payer: 59 | Admitting: Cardiology

## 2014-03-16 VITALS — BP 130/68 | HR 83 | Ht 64.0 in | Wt 227.1 lb

## 2014-03-16 DIAGNOSIS — I2510 Atherosclerotic heart disease of native coronary artery without angina pectoris: Secondary | ICD-10-CM

## 2014-03-16 DIAGNOSIS — I10 Essential (primary) hypertension: Secondary | ICD-10-CM

## 2014-03-16 MED ORDER — RANOLAZINE ER 500 MG PO TB12: 500.0000 mg | ORAL_TABLET | Freq: Two times a day (BID) | ORAL | Status: DC

## 2014-03-16 NOTE — Progress Notes (Signed)
Patient ID: Courtney Steele, female   DOB: 03-07-1951, 63 y.o.   MRN: [id number]    HPI  Patient is seen in followup coronary disease. I saw her last June, 2014. She is doing well. She has cut the dose of Ranexa in a half and she is not having problems.  Allergies  Allergen Reactions  . Benazepril Cough  . Erythromycin Diarrhea  . Indomethacin Diarrhea  . Biaxin [Clarithromycin] Rash  . Penicillins Rash    Current Outpatient Prescriptions  Medication Sig Dispense Refill  . albuterol (PROVENTIL HFA;VENTOLIN HFA) 108 (90 BASE) MCG/ACT inhaler Inhale 1-2 puffs into the lungs every 6 (six) hours as needed for wheezing.  1 Inhaler  0  . amLODipine (NORVASC) 2.5 MG tablet Take 1 tablet (2.5 mg total) by mouth daily.  90 tablet  3  . aspirin EC 81 MG tablet Take 81 mg by mouth daily.      Marland Kitchen atorvastatin (LIPITOR) 20 MG tablet Take 1 tablet (20 mg total) by mouth daily.  90 tablet  3  . benzonatate (TESSALON) 100 MG capsule Take 1 capsule (100 mg total) by mouth every 8 (eight) hours.  21 capsule  0  . calcium carbonate 200 MG capsule Take 250 mg by mouth daily.        . clopidogrel (PLAVIX) 75 MG tablet Take 1 tablet (75 mg total) by mouth daily.  90 tablet  3  . cyclobenzaprine (FLEXERIL) 10 MG tablet Take 10 mg by mouth 3 (three) times daily as needed. For back pain      . DULoxetine (CYMBALTA) 30 MG capsule Take 90 mg by mouth daily.       . ergocalciferol (VITAMIN D2) 50000 UNITS capsule Take 50,000 Units by mouth once a week. Take on Saturday      . isosorbide mononitrate (IMDUR) 30 MG 24 hr tablet Take 0.5 tablets (15 mg total) by mouth daily.  90 tablet  3  . metoprolol succinate (TOPROL-XL) 25 MG 24 hr tablet Take 1 tablet (25 mg total) by mouth daily.  90 tablet  3  . nitroGLYCERIN (NITROSTAT) 0.4 MG SL tablet Place 1 tablet (0.4 mg total) under the tongue every 5 (five) minutes as needed. For chest pain  25 tablet  3  . pantoprazole (PROTONIX) 40 MG tablet Take 1 tablet (40 mg total) by  mouth daily.  90 tablet  3  . pregabalin (LYRICA) 75 MG capsule Take 225 mg by mouth 2 (two) times daily.       . ranolazine (RANEXA) 500 MG 12 hr tablet Take 2 tablets (1,000 mg total) by mouth 2 (two) times daily.  360 tablet  3  . traMADol (ULTRAM) 50 MG tablet Take 50 mg by mouth 3 (three) times daily as needed. For pain      . [DISCONTINUED] albuterol (PROVENTIL,VENTOLIN) 90 MCG/ACT inhaler Inhale 1-2 puffs into the lungs every 4 (four) hours as needed. For wheezing       No current facility-administered medications for this visit.    History   Social History  . Marital Status: Married    Spouse Name: N/A    Number of Children: 2  . Years of Education: N/A   Occupational History  . [employer]   Social History Main Topics  . Smoking status: Never Smoker   . Smokeless tobacco: Never Used  . Alcohol Use: Yes     Comment: occasional  . Drug Use: No  . Sexual Activity: Not on  file   Other Topics Concern  . Not on file   Social History Narrative  . No narrative on file    Family History  Problem Relation Age of Onset  . Other Mother     died pulmonary issues  . Coronary artery disease Father 31    died- CABG-HTN- AAA    Past Medical History  Diagnosis Date  . CAD (coronary artery disease)   . Hypertension   . Hyperlipidemia   . Borderline diabetes   . Morbid obesity   . Fibromyalgia   . Osteoarthritis   . Degenerative joint disease   . Groin hematoma   . Hematoma     Radial catheter site July 13, 2011  . Diabetes mellitus     Past Surgical History  Procedure Laterality Date  . Cardiac catheterization      revealing patency of the LAD stent with mild compromise of  the diagonal and moderate mid LAD stenosis, but does not appear to  be high grade.   . Cholecystectomy    . Tonsillectomy    . Carpal tunnel release    . Bunionectomy    . Knee arthroscopy    . Abdominal hysterectomy    . Angioplasty      Patient Active Problem List    Diagnosis Date Noted  . Osteoarthritis   . Midsternal chest pain 02/23/2012  . Dizziness 10/29/2011  . CAD (coronary artery disease)   . Hypertension   . Hyperlipidemia   . Fibromyalgia   . Groin hematoma   . Hematoma     ROS   Patient denies fever, chills, headache, sweats, rash, change in vision, change in hearing, chest pain, cough, nausea vomiting, urinary symptoms. All other systems are reviewed and are negative.  PHYSICAL EXAM  Patient is oriented to person time and place. Affect is normal. She is overweight. There is no jugulovenous distention. Lungs are clear. Respiratory effort is nonlabored. Cardiac exam reveals S1 and S2. There no clicks or significant murmurs. The abdomen is soft. There is no peripheral edema.  Filed Vitals:   03/16/14 1006  BP: 130/68  Pulse: 83  Height: 5\' 4"  (1.626 m)  Weight: 227 lb 1.9 oz (103.021 kg)   EKG is done today and reviewed by me. There is old incomplete right bundle branch block. There is no significant change.  ASSESSMENT & PLAN

## 2014-03-16 NOTE — Assessment & Plan Note (Signed)
Blood pressures control. No change in therapy. 

## 2014-03-16 NOTE — Patient Instructions (Signed)
Your physician recommends that you continue on your current medications as directed. Please refer to the Current Medication list given to you today.  Your physician wants you to follow-up in: 1 year. You will receive a reminder letter in the mail two months in advance. If you don't receive a letter, please call our office to schedule the follow-up appointment.  

## 2014-03-16 NOTE — Assessment & Plan Note (Signed)
I'm very pleased and her coronary disease has remained stable. Her last nuclear study was April, 2014. She does not need any further workup at this time. I have outlined the rationale for continuing her Lipitor at 20 mg instead of raising it to 40 mg. There is fibromyalgia and she tolerates only 20 mg.

## 2014-04-27 ENCOUNTER — Emergency Department (HOSPITAL_COMMUNITY)
Admission: EM | Admit: 2014-04-27 | Discharge: 2014-04-27 | Disposition: A | Discharge status: Home / Self Care | Payer: 59 | Source: Home / Self Care | Attending: Emergency Medicine | Admitting: Emergency Medicine

## 2014-04-27 ENCOUNTER — Emergency Department (INDEPENDENT_AMBULATORY_CARE_PROVIDER_SITE_OTHER): Payer: 59

## 2014-04-27 ENCOUNTER — Encounter (HOSPITAL_COMMUNITY): Payer: Self-pay | Admitting: Emergency Medicine

## 2014-04-27 DIAGNOSIS — J209 Acute bronchitis, unspecified: Secondary | ICD-10-CM

## 2014-04-27 DIAGNOSIS — J45901 Unspecified asthma with (acute) exacerbation: Secondary | ICD-10-CM

## 2014-04-27 MED ORDER — ALBUTEROL SULFATE (2.5 MG/3ML) 0.083% IN NEBU
5.0000 mg | INHALATION_SOLUTION | Freq: Once | RESPIRATORY_TRACT | Status: AC
  Administered 2014-04-27: 5 mg via RESPIRATORY_TRACT

## 2014-04-27 MED ORDER — IPRATROPIUM-ALBUTEROL 0.5-2.5 (3) MG/3ML IN SOLN
3.0000 mL | Freq: Once | RESPIRATORY_TRACT | Status: AC
  Administered 2014-04-27: 3 mL via RESPIRATORY_TRACT

## 2014-04-27 MED ORDER — IPRATROPIUM BROMIDE 0.02 % IN SOLN
RESPIRATORY_TRACT | Status: AC
  Filled 2014-04-27: qty 2.5

## 2014-04-27 MED ORDER — ALBUTEROL SULFATE HFA 108 (90 BASE) MCG/ACT IN AERS: 2.0000 | INHALATION_SPRAY | RESPIRATORY_TRACT | Status: DC | PRN

## 2014-04-27 MED ORDER — ALBUTEROL SULFATE (2.5 MG/3ML) 0.083% IN NEBU
INHALATION_SOLUTION | RESPIRATORY_TRACT | Status: AC
  Filled 2014-04-27: qty 6

## 2014-04-27 MED ORDER — MOXIFLOXACIN HCL 400 MG PO TABS: 400.0000 mg | ORAL_TABLET | Freq: Every day | ORAL | Status: DC

## 2014-04-27 MED ORDER — HYDROCOD POLST-CHLORPHEN POLST 10-8 MG/5ML PO LQCR: 5.0000 mL | Freq: Two times a day (BID) | ORAL | Status: DC | PRN

## 2014-04-27 NOTE — ED Notes (Signed)
Pt c/o cold sx Dr. Jake Michaelis has seen pt already Alert w/no signs of acute distress.

## 2014-04-27 NOTE — Discharge Instructions (Signed)
Acute Bronchitis Bronchitis is inflammation of the airways that extend from the windpipe into the lungs (bronchi). The inflammation often causes mucus to develop. This leads to a cough, which is the most common symptom of bronchitis.  In acute bronchitis, the condition usually develops suddenly and goes away over time, usually in a couple weeks. Smoking, allergies, and asthma can make bronchitis worse. Repeated episodes of bronchitis may cause further lung problems.  CAUSES Acute bronchitis is most often caused by the same virus that causes a cold. The virus can spread from person to person (contagious).  SIGNS AND SYMPTOMS   Cough.   Fever.   Coughing up mucus.   Body aches.   Chest congestion.   Chills.   Shortness of breath.   Sore throat.  DIAGNOSIS  Acute bronchitis is usually diagnosed through a physical exam. Tests, such as chest X-rays, are sometimes done to rule out other conditions.  TREATMENT  Acute bronchitis usually goes away in a couple weeks. Often times, no medical treatment is necessary. Medicines are sometimes given for relief of fever or cough. Antibiotics are usually not needed but may be prescribed in certain situations. In some cases, an inhaler may be recommended to help reduce shortness of breath and control the cough. A cool mist vaporizer may also be used to help thin bronchial secretions and make it easier to clear the chest.  HOME CARE INSTRUCTIONS  Get plenty of rest.   Drink enough fluids to keep your urine clear or pale yellow (unless you have a medical condition that requires fluid restriction). Increasing fluids may help thin your secretions and will prevent dehydration.   Only take over-the-counter or prescription medicines as directed by your health care provider.   Avoid smoking and secondhand smoke. Exposure to cigarette smoke or irritating chemicals will make bronchitis worse. If you are a smoker, consider using nicotine gum or skin  patches to help control withdrawal symptoms. Quitting smoking will help your lungs heal faster.   Reduce the chances of another bout of acute bronchitis by washing your hands frequently, avoiding people with cold symptoms, and trying not to touch your hands to your mouth, nose, or eyes.   Follow up with your health care provider as directed.  SEEK MEDICAL CARE IF: Your symptoms do not improve after 1 week of treatment.  SEEK IMMEDIATE MEDICAL CARE IF:  You develop an increased fever or chills.   You have chest pain.   You have severe shortness of breath.  You have bloody sputum.   You develop dehydration.  You develop fainting.  You develop repeated vomiting.  You develop a severe headache. MAKE SURE YOU:   Understand these instructions.  Will watch your condition.  Will get help right away if you are not doing well or get worse. Document Released: 01/03/2005 Document Revised: 07/29/2013 Document Reviewed: 05/19/2013 [facility] Patient Information 2014 [place].  Most upper respiratory infections are caused by viruses and do not require antibiotics.  We try to save the antibiotics for when we really need them to prevent bacteria from developing resistance to them.  Here are a few hints about things that can be done at home to help get over an upper respiratory infection quicker:  Get extra sleep and extra fluids.  Get 7 to 9 hours of sleep per night and 6 to 8 glasses of water a day.  Getting extra sleep keeps the immune system from getting run down.  Most people with an upper respiratory infection  are a little dehydrated.  The extra fluids also keep the secretions liquified and easier to deal with.  Also, get extra vitamin C.  4000 mg per day is the recommended dose. For the aches, headache, and fever, acetaminophen or ibuprofen are helpful.  These can be alternated every 4 hours.  People with liver disease should avoid large amounts of acetaminophen, and people with  ulcer disease, gastroesophageal reflux, gastritis, congestive heart failure, chronic kidney disease, coronary artery disease and the elderly should avoid ibuprofen. For nasal congestion try Mucinex-D, or if you're having lots of sneezing or clear nasal drainage use Zyrtec-D. People with high blood pressure can take these if their blood pressure is controlled, if not, it's best to avoid the forms with a "D" (decongestants).  You can use the plain Mucinex, Allegra, Claritin, or Zyrtec even if your blood pressure is not controlled.   A Saline nasal spray such as Ocean Spray can also help.  You can add a decongestant sprays such as Afrin, but you should not use the decongestant sprays for more than 3 or 4 days since they can be habituating.  Breathe Rite nasal strips can also offer a non-drug alternative treatment to nasal congestion, especially at night. For people with symptoms of sinusitis, sleeping with your head elevated can be helpful.  For sinus pain, moist, hot compresses to the face may provide some relief.  Many people find that inhaling steam as in a shower or from a pot of steaming water can help. For any viral infection, zinc containing lozenges such as Cold-Eze or Zicam are helpful.  Zinc helps to fight viral infection.  Hot salt water gargles (8 oz of hot water, 1/2 tsp of table salt, and a pinch of baking soda) can give relief as well as hot beverages such as hot tea.  Sucrets extra strength lozenges will help the sore throat.  For the cough, take Delsym 2 tsp every 12 hours.  It has also been found recently that Aleve can help control a cough.  The dose is 1 to 2 tablets twice daily with food.  This can be combined with Delsym. (Note, if you are taking ibuprofen, you should not take Aleve as well--take one or the other.) A cool mist vaporizer will help keep your mucous membranes from drying out.   It's important when you have an upper respiratory infection not to pass the infection to others.   This involves being very careful about the following:  Frequent hand washing or use of hand sanitizer, especially after coughing, sneezing, blowing your nose or touching your face, nose or eyes. Do not shake hands or touch anyone and try to avoid touching surfaces that other people use such as doorknobs, shopping carts, telephones and computer keyboards. Use tissues and dispose of them properly in a garbage can or ziplock bag. Cough into your sleeve. Do not let others eat or drink after you.  It's also important to recognize the signs of serious illness and get evaluated if they occur: Any respiratory infection that lasts more than 7 to 10 days.  Yellow nasal drainage and sputum are not reliable indicators of a bacterial infection, but if they last for more than 1 week, see your doctor. Fever and sore throat can indicate strep. Fever and cough can indicate influenza or pneumonia. Any kind of severe symptom such as difficulty breathing, intractable vomiting, or severe pain should prompt you to see a doctor as soon as possible.   Your body's immune  system is really the thing that will get rid of this infection.  Your immune system is comprised of 2 types of specialized cells called T cells and B cells.  T cells coordinate the array of cells in your body that engulf invading bacteria or viruses while B cells orchestrate the production of antibodies that neutralize infection.  Anything we do or any medications we give you, will just strengthen your immune system or help it clear up the infection quicker.  Here are a few helpful hints to improve your immune system to help overcome this illness or to prevent future infections:  A few vitamins can improve the health of your immune system.  That's why your diet should include plenty of fruits, vegetables, fish, nuts, and whole grains.  Vitamin A and bet-carotene can increase the cells that fight infections (T cells and B cells).  Vitamin A is abundant in  dark greens and orange vegetables such as spinach, greens, sweet potatoes, and carrots.  Vitamin B6 contributes to the maturation of white blood cells, the cells that fight disease.  Foods with vitamin B6 include cold cereal and bananas.  Vitamin C is credited with preventing colds because it increases white blood cells and also prevents cellular damage.  Citrus fruits, peaches and green and red bell peppers are all hight in vitamin C.  Vitamin E is an anti-oxidant that encourages the production of natural killer cells which reject foreign invaders and B cells that produce antibodies.  Foods high in vitamin E include wheat germ, nuts and seeds.  Foods high in omega-3 fatty acids found in foods like salmon, tuna and mackerel boost your immune system and help cells to engulf and absorb germs.  Probiotics are good bacteria that increase your T cells.  These can be found in yogurt and are available in supplements such as Culturelle or Align.  Moderate exercise increases the strength of your immune system and your ability to recover from illness.  I suggest 3 to 5 moderate intensity 30 minute workouts per week.    Sleep is another component of maintaining a strong immune system.  It enables your body to recuperate from the day's activities, stress and work.  My recommendation is to get between 7 and 9 hours of sleep per night.  If you smoke, try to quit completely or at least cut down.  Drink alcohol only in moderation if at all.  No more than 2 drinks daily for men or 1 for women.  Get a flu vaccine early in the fall or if you have not gotten one yet, once this illness has run its course.  If you are over 65, a smoker, or an asthmatic, get a pneumococcal vaccine.  My final recommendation is to maintain a healthy weight.  Excess weight can impair the immune system by interfering with the way the immune system deals with invading viruses or bacteria.

## 2014-04-27 NOTE — ED Provider Notes (Signed)
Chief Complaint   Chief Complaint  Patient presents with  . URI    History of Present Illness   Courtney Steele is a 63 year old female who has had a five-day history of dry cough, wheezing, and temperature of up to 100.6. She denies any headache, nasal congestion, rhinorrhea, or sore throat. She has had no chest pain or GI symptoms.  Review of Systems   Other than as noted above, the patient denies any of the following symptoms: Systemic:  No fevers, chills, sweats, or weight loss. ENT:  No nasal congestion, sneezing, itching, postnasal drip, sinus pressure, headache, sore throat, or hoarseness. Lungs:  No wheezing, shortness of breath, chest tightness or congestion. Heart:  No chest pain, tightness, pressure, PND, orthopnea, or ankle edema. GI:  No indigestion, heartburn, waterbrash, burping, abdominal pain, nausea, or vomiting.  [place]   Past medical history, family history, social history, meds, and allergies were reviewed. She is allergic to Biaxin, penicillin, erythromycin's, and indomethacin. Current meds include atorvastatin, Cymbalta, Plavix, metoprolol, Norvasc, tramadol, Imdur, Ranexa, and pantoprazole. Medical history includes coronary artery disease, hypertension, hyperlipidemia,, and osteoarthritis.  Physical Examination     Vital signs:  BP 139/79  Pulse 112  Temp(Src) 99.2 F (37.3 C) (Oral)  Resp 20  SpO2 94% General:  Alert and oriented.  In no distress.  Skin warm and dry. ENT: TMs and ear canals normal.  Nasal mucosa normal, without drainage.  Pharynx clear without exudate or drainage.  No intraoral lesions. Neck:  No adenopathy, tenderness or mass.  No JVD. Lungs:  No respiratory distress.  Breath sounds clear and equal bilaterally.  No wheezes, rales or rhonchi. Heart:  Regular rhythm, no gallops or murmers.  No pedal edema. Abdomon:  Soft and nontender.  No organomegaly or mass.  Radiology   Dg Chest 2 View  04/27/2014   CLINICAL DATA:  Cough and fever   EXAM: CHEST  2 VIEW  COMPARISON:  PA and lateral chest x-ray dated August 03, 2013  FINDINGS: The lungs are well-expanded and clear. The cardiopericardial silhouette is normal in size. The pulmonary vascularity is not engorged. The mediastinum is normal in width. There is mild tortuosity of the descending thoracic aorta. The observed portions of the bony thorax appear normal.  IMPRESSION: There is no evidence of active cardiopulmonary disease.   Electronically Signed   By: Vivien Barretto  [place]   On: 04/27/2014 13:57    Course in Urgent [place]   She was given a DuoNeb breathing treatment. She felt about the same but her O2 saturation increased to 97%.  Assessment   The encounter diagnosis was Acute bronchitis.  Plan     1.  Meds:  The following meds were prescribed:   Discharge Medication List as of 04/27/2014  2:29 PM    START taking these medications   Details  !! albuterol (PROVENTIL HFA;VENTOLIN HFA) 108 (90 BASE) MCG/ACT inhaler Inhale 2 puffs into the lungs every 4 (four) hours as needed for wheezing or shortness of breath., Starting 04/27/2014, Until Discontinued, Normal    chlorpheniramine-HYDROcodone (TUSSIONEX) 10-8 MG/5ML LQCR Take 5 mLs by mouth every 12 (twelve) hours as needed for cough., Starting 04/27/2014, Until Discontinued, Normal    moxifloxacin (AVELOX) 400 MG tablet Take 1 tablet (400 mg total) by mouth daily at 8 pm., Starting 04/27/2014, Until Discontinued, Normal     !! - Potential duplicate medications found. Please discuss with provider.      2.  Patient Education/Counseling:  The patient was  given appropriate handouts, self care instructions, and instructed in symptomatic relief.    3.  Follow up:  The patient was told to follow up here if no better in 3 to 4 days, or sooner if becoming worse in any way, and given some red flag symptoms such as difficulty breathing or chest pain which would prompt immediate return.         Harden Mo, MD 04/27/14 [phone]

## 2014-05-19 ENCOUNTER — Emergency Department (HOSPITAL_COMMUNITY): Payer: 59

## 2014-05-19 ENCOUNTER — Emergency Department (HOSPITAL_COMMUNITY)
Admission: EM | Admit: 2014-05-19 | Discharge: 2014-05-19 | Disposition: A | Discharge status: Home / Self Care | Payer: 59 | Attending: Emergency Medicine | Admitting: Emergency Medicine

## 2014-05-19 ENCOUNTER — Encounter (HOSPITAL_COMMUNITY): Payer: Self-pay | Admitting: Emergency Medicine

## 2014-05-19 DIAGNOSIS — E785 Hyperlipidemia, unspecified: Secondary | ICD-10-CM | POA: Insufficient documentation

## 2014-05-19 DIAGNOSIS — M1990 Unspecified osteoarthritis, unspecified site: Secondary | ICD-10-CM | POA: Insufficient documentation

## 2014-05-19 DIAGNOSIS — Z9889 Other specified postprocedural states: Secondary | ICD-10-CM | POA: Insufficient documentation

## 2014-05-19 DIAGNOSIS — Z791 Long term (current) use of non-steroidal anti-inflammatories (NSAID): Secondary | ICD-10-CM | POA: Insufficient documentation

## 2014-05-19 DIAGNOSIS — I10 Essential (primary) hypertension: Secondary | ICD-10-CM | POA: Insufficient documentation

## 2014-05-19 DIAGNOSIS — Z79899 Other long term (current) drug therapy: Secondary | ICD-10-CM | POA: Insufficient documentation

## 2014-05-19 DIAGNOSIS — IMO0001 Reserved for inherently not codable concepts without codable children: Secondary | ICD-10-CM | POA: Insufficient documentation

## 2014-05-19 DIAGNOSIS — G43909 Migraine, unspecified, not intractable, without status migrainosus: Secondary | ICD-10-CM | POA: Insufficient documentation

## 2014-05-19 DIAGNOSIS — E119 Type 2 diabetes mellitus without complications: Secondary | ICD-10-CM | POA: Insufficient documentation

## 2014-05-19 DIAGNOSIS — Z7982 Long term (current) use of aspirin: Secondary | ICD-10-CM | POA: Insufficient documentation

## 2014-05-19 DIAGNOSIS — I2510 Atherosclerotic heart disease of native coronary artery without angina pectoris: Secondary | ICD-10-CM | POA: Insufficient documentation

## 2014-05-19 DIAGNOSIS — Z87828 Personal history of other (healed) physical injury and trauma: Secondary | ICD-10-CM | POA: Insufficient documentation

## 2014-05-19 DIAGNOSIS — Z880 Allergy status to penicillin: Secondary | ICD-10-CM | POA: Insufficient documentation

## 2014-05-19 DIAGNOSIS — Z7902 Long term (current) use of antithrombotics/antiplatelets: Secondary | ICD-10-CM | POA: Insufficient documentation

## 2014-05-19 DIAGNOSIS — Z792 Long term (current) use of antibiotics: Secondary | ICD-10-CM | POA: Insufficient documentation

## 2014-05-19 MED ORDER — MORPHINE SULFATE 4 MG/ML IJ SOLN
4.0000 mg | Freq: Once | INTRAMUSCULAR | Status: AC
  Administered 2014-05-19: 4 mg via INTRAVENOUS
  Filled 2014-05-19: qty 1

## 2014-05-19 MED ORDER — HYDROMORPHONE HCL PF 1 MG/ML IJ SOLN
1.0000 mg | Freq: Once | INTRAMUSCULAR | Status: AC
  Administered 2014-05-19: 1 mg via INTRAVENOUS
  Filled 2014-05-19: qty 1

## 2014-05-19 MED ORDER — OXYCODONE-ACETAMINOPHEN 5-325 MG PO TABS: 1.0000 | ORAL_TABLET | ORAL | Status: DC | PRN

## 2014-05-19 MED ORDER — ONDANSETRON HCL 4 MG/2ML IJ SOLN
4.0000 mg | Freq: Once | INTRAMUSCULAR | Status: AC
  Administered 2014-05-19: 4 mg via INTRAVENOUS
  Filled 2014-05-19: qty 2

## 2014-05-19 MED ORDER — KETOROLAC TROMETHAMINE 30 MG/ML IJ SOLN
30.0000 mg | Freq: Once | INTRAMUSCULAR | Status: AC
  Administered 2014-05-19: 30 mg via INTRAVENOUS
  Filled 2014-05-19: qty 1

## 2014-05-19 MED ORDER — METOCLOPRAMIDE HCL 5 MG/ML IJ SOLN
10.0000 mg | Freq: Once | INTRAMUSCULAR | Status: AC
  Administered 2014-05-19: 10 mg via INTRAVENOUS
  Filled 2014-05-19: qty 2

## 2014-05-19 MED ORDER — ONDANSETRON 8 MG PO TBDP: 8.0000 mg | ORAL_TABLET | Freq: Three times a day (TID) | ORAL | Status: DC | PRN

## 2014-05-19 MED ORDER — SODIUM CHLORIDE 0.9 % IV BOLUS (SEPSIS)
1000.0000 mL | Freq: Once | INTRAVENOUS | Status: AC
  Administered 2014-05-19: 1000 mL via INTRAVENOUS

## 2014-05-19 MED ORDER — ACETAMINOPHEN 500 MG PO TABS
1000.0000 mg | ORAL_TABLET | Freq: Once | ORAL | Status: AC
  Administered 2014-05-19: 1000 mg via ORAL
  Filled 2014-05-19: qty 2

## 2014-05-19 MED ORDER — NAPROXEN 500 MG PO TABS: 500.0000 mg | ORAL_TABLET | Freq: Two times a day (BID) | ORAL | Status: DC

## 2014-05-19 NOTE — ED Provider Notes (Signed)
CSN: [id number]     Arrival date & time 05/19/14  0086 History   First MD Initiated Contact with Patient 05/19/14 [phone]     Chief Complaint  Patient presents with  . Headache  . Emesis      HPI The patient reports mammogram by severe headache today.  She has photophobia and phonophobia.  She's had nausea and vomiting.  She's never had an ache like this before.  No prior history of migraine headaches.  She came to the ER for evaluation.  She's not tried any medications prior to arrival.  She denies chest pain shortness breath.  No neck pain.  No back pain.  No abdominal pain   Past Medical History  Diagnosis Date  . CAD (coronary artery disease)   . Hypertension   . Hyperlipidemia   . Borderline diabetes   . Morbid obesity   . Fibromyalgia   . Osteoarthritis   . Degenerative joint disease   . Groin hematoma   . Hematoma     Radial catheter site July 13, 2011  . Diabetes mellitus    Past Surgical History  Procedure Laterality Date  . Cardiac catheterization      revealing patency of the LAD stent with mild compromise of  the diagonal and moderate mid LAD stenosis, but does not appear to  be high grade.   . Cholecystectomy    . Tonsillectomy    . Carpal tunnel release    . Bunionectomy    . Knee arthroscopy    . Abdominal hysterectomy    . Angioplasty     Family History  Problem Relation Age of Onset  . Other Mother     died pulmonary issues  . Coronary artery disease Father 64    died- CABG-HTN- AAA   History  Substance Use Topics  . Smoking status: Never Smoker   . Smokeless tobacco: Never Used  . Alcohol Use: Yes     Comment: occasional   OB History   Grav Para Term Preterm Abortions TAB SAB Ect Mult Living                 Review of Systems  All other systems reviewed and are negative.     Allergies  Benazepril; Erythromycin; Indomethacin; Biaxin; and Penicillins  Home Medications   Prior to Admission medications   Medication Sig Start Date End  Date Taking? Authorizing Provider  albuterol (PROVENTIL HFA;VENTOLIN HFA) 108 (90 BASE) MCG/ACT inhaler Inhale 2 puffs into the lungs every 4 (four) hours as needed for wheezing or shortness of breath. 04/27/14  Yes Harden Mo, MD  amLODipine (NORVASC) 2.5 MG tablet Take 1 tablet (2.5 mg total) by mouth daily. 06/02/13  Yes Carlena Bjornstad, MD  aspirin EC 81 MG tablet Take 81 mg by mouth daily.   Yes Historical Provider, MD  atorvastatin (LIPITOR) 20 MG tablet Take 1 tablet (20 mg total) by mouth daily. 06/02/13  Yes Carlena Bjornstad, MD  calcium carbonate 200 MG capsule Take 250 mg by mouth daily.     Yes Historical Provider, MD  chlorpheniramine-HYDROcodone (TUSSIONEX) 10-8 MG/5ML LQCR Take 5 mLs by mouth every 12 (twelve) hours as needed for cough. 04/27/14  Yes Harden Mo, MD  clopidogrel (PLAVIX) 75 MG tablet Take 1 tablet (75 mg total) by mouth daily. 06/02/13  Yes Carlena Bjornstad, MD  DULoxetine (CYMBALTA) 30 MG capsule Take 90 mg by mouth daily.    Yes Historical Provider, MD  ergocalciferol (  VITAMIN D2) 50000 UNITS capsule Take 50,000 Units by mouth once a week. Take on Saturday   Yes Historical Provider, MD  isosorbide mononitrate (IMDUR) 30 MG 24 hr tablet Take 0.5 tablets (15 mg total) by mouth daily. 06/02/13  Yes Carlena Bjornstad, MD  metoprolol succinate (TOPROL-XL) 25 MG 24 hr tablet Take 1 tablet (25 mg total) by mouth daily. 06/02/13  Yes Carlena Bjornstad, MD  nitroGLYCERIN (NITROSTAT) 0.4 MG SL tablet Place 1 tablet (0.4 mg total) under the tongue every 5 (five) minutes as needed. For chest pain 06/02/13  Yes Carlena Bjornstad, MD  pantoprazole (PROTONIX) 40 MG tablet Take 1 tablet (40 mg total) by mouth daily. 06/02/13  Yes Carlena Bjornstad, MD  pregabalin (LYRICA) 75 MG capsule Take 225 mg by mouth 2 (two) times daily.    Yes Historical Provider, MD  ranolazine (RANEXA) 500 MG 12 hr tablet Take 1 tablet (500 mg total) by mouth 2 (two) times daily. 03/16/14 03/16/15 Yes Carlena Bjornstad, MD  traMADol  (ULTRAM) 50 MG tablet Take 50 mg by mouth 3 (three) times daily as needed. For pain   Yes Historical Provider, MD  moxifloxacin (AVELOX) 400 MG tablet Take 1 tablet (400 mg total) by mouth daily at 8 pm. 04/27/14   Harden Mo, MD  naproxen (NAPROSYN) 500 MG tablet Take 1 tablet (500 mg total) by mouth 2 (two) times daily. 05/19/14   Hoy Morn, MD  ondansetron (ZOFRAN ODT) 8 MG disintegrating tablet Take 1 tablet (8 mg total) by mouth every 8 (eight) hours as needed for nausea or vomiting. 05/19/14   Hoy Morn, MD  oxyCODONE-acetaminophen (PERCOCET/ROXICET) 5-325 MG per tablet Take 1 tablet by mouth every 4 (four) hours as needed for severe pain. 05/19/14   Hoy Morn, MD   BP 103/61  Pulse 76  Temp(Src) 98.2 F (36.8 C) (Oral)  Resp 14  SpO2 93% Physical Exam  Nursing note and vitals reviewed. Constitutional: She is oriented to person, place, and time. She appears well-developed and well-nourished. No distress.  HENT:  Head: Normocephalic and atraumatic.  Eyes: EOM are normal. Pupils are equal, round, and reactive to light.  Neck: Normal range of motion.  Cardiovascular: Normal rate, regular rhythm and normal heart sounds.   Pulmonary/Chest: Effort normal and breath sounds normal.  Abdominal: Soft. She exhibits no distension. There is no tenderness.  Musculoskeletal: Normal range of motion.  Neurological: She is alert and oriented to person, place, and time.  5/5 strength in major muscle groups of  bilateral upper and lower extremities. Speech normal. No facial asymetry.   Skin: Skin is warm and dry.  Psychiatric: She has a normal mood and affect. Judgment normal.    ED Course  Procedures (including critical care time) Labs Review Labs Reviewed - No data to display  Imaging Review Ct Head Wo Contrast  05/19/2014   CLINICAL DATA:  Significant headache  EXAM: CT HEAD WITHOUT CONTRAST  TECHNIQUE: Contiguous axial images were obtained from the base of the skull through  the vertex without intravenous contrast.  COMPARISON:  05/07/2007  FINDINGS: The bony calvarium is intact. The ventricles are of normal size and configuration. No findings to suggest acute hemorrhage, acute infarction or space-occupying mass lesion are noted.  IMPRESSION: No acute abnormality is noted.   Electronically Signed   By: Inez Catalina M.D.   On: 05/19/2014 08:24     EKG Interpretation None      MDM  Final diagnoses:  Migraine headache    10:13 AM Patient feels much better at this time.  Discharge home in good condition.  She'll followup with her primary care physician.  She understands to return to the ER for new or worsening symptoms    Hoy Morn, MD 05/19/14 1013

## 2014-05-19 NOTE — Discharge Instructions (Signed)
Migraine Headache A migraine headache is an intense, throbbing pain on one or both sides of your head. A migraine can last for 30 minutes to several hours. CAUSES  The exact cause of a migraine headache is not always known. However, a migraine may be caused when nerves in the brain become irritated and release chemicals that cause inflammation. This causes pain. Certain things may also trigger migraines, such as:  Alcohol.  Smoking.  Stress.  Menstruation.  Aged cheeses.  Foods or drinks that contain nitrates, glutamate, aspartame, or tyramine.  Lack of sleep.  Chocolate.  Caffeine.  Hunger.  Physical exertion.  Fatigue.  Medicines used to treat chest pain (nitroglycerine), birth control pills, estrogen, and some blood pressure medicines. SIGNS AND SYMPTOMS  Pain on one or both sides of your head.  Pulsating or throbbing pain.  Severe pain that prevents daily activities.  Pain that is aggravated by any physical activity.  Nausea, vomiting, or both.  Dizziness.  Pain with exposure to bright lights, loud noises, or activity.  General sensitivity to bright lights, loud noises, or smells. Before you get a migraine, you may get warning signs that a migraine is coming (aura). An aura may include:  Seeing flashing lights.  Seeing bright spots, halos, or zig-zag lines.  Having tunnel vision or blurred vision.  Having feelings of numbness or tingling.  Having trouble talking.  Having muscle weakness. DIAGNOSIS  A migraine headache is often diagnosed based on:  Symptoms.  Physical exam.  A CT scan or MRI of your head. These imaging tests cannot diagnose migraines, but they can help rule out other causes of headaches. TREATMENT Medicines may be given for pain and nausea. Medicines can also be given to help prevent recurrent migraines.  HOME CARE INSTRUCTIONS  Only take over-the-counter or prescription medicines for pain or discomfort as directed by your  health care provider. The use of long-term narcotics is not recommended.  Lie down in a dark, quiet room when you have a migraine.  Keep a journal to find out what may trigger your migraine headaches. For example, write down:  What you eat and drink.  How much sleep you get.  Any change to your diet or medicines.  Limit alcohol consumption.  Quit smoking if you smoke.  Get 7 9 hours of sleep, or as recommended by your health care provider.  Limit stress.  Keep lights dim if bright lights bother you and make your migraines worse. SEEK IMMEDIATE MEDICAL CARE IF:   Your migraine becomes severe.  You have a fever.  You have a stiff neck.  You have vision loss.  You have muscular weakness or loss of muscle control.  You start losing your balance or have trouble walking.  You feel faint or pass out.  You have severe symptoms that are different from your first symptoms. MAKE SURE YOU:   Understand these instructions.  Will watch your condition.  Will get help right away if you are not doing well or get worse. Document Released: 11/26/2005 Document Revised: 09/16/2013 Document Reviewed: 08/03/2013 [employer] Patient Information 2014 [employer].  

## 2014-05-19 NOTE — ED Notes (Signed)
Pt reports that she woke up this morning at 530 with the worst headache she has ever had. Pt reports that it started in the front but it is now all over. Pt is nauseous at this time and has vomited x 3 times. Lights and loud noises make it worse.

## 2014-08-04 ENCOUNTER — Other Ambulatory Visit: Payer: Self-pay | Admitting: Cardiology

## 2014-08-10 ENCOUNTER — Other Ambulatory Visit: Payer: Self-pay | Admitting: Cardiology

## 2014-08-19 ENCOUNTER — Other Ambulatory Visit: Payer: Self-pay

## 2014-08-19 DIAGNOSIS — Z1231 Encounter for screening mammogram for malignant neoplasm of breast: Secondary | ICD-10-CM

## 2014-08-23 ENCOUNTER — Other Ambulatory Visit: Payer: Self-pay | Admitting: Cardiology

## 2014-08-31 ENCOUNTER — Ambulatory Visit
Admission: RE | Admit: 2014-08-31 | Discharge: 2014-08-31 | Disposition: A | Discharge status: Home / Self Care | Payer: 59 | Source: Ambulatory Visit

## 2014-08-31 DIAGNOSIS — Z1231 Encounter for screening mammogram for malignant neoplasm of breast: Secondary | ICD-10-CM

## 2014-12-09 ENCOUNTER — Other Ambulatory Visit: Payer: Self-pay | Admitting: *Deleted

## 2014-12-09 ENCOUNTER — Other Ambulatory Visit: Payer: Self-pay | Admitting: Cardiology

## 2014-12-09 MED ORDER — RANOLAZINE ER 500 MG PO TB12: 500.0000 mg | ORAL_TABLET | Freq: Two times a day (BID) | ORAL | Status: DC

## 2014-12-11 ENCOUNTER — Telehealth (HOSPITAL_COMMUNITY): Payer: Self-pay | Admitting: Family Medicine

## 2014-12-11 MED ORDER — CHLORHEXIDINE GLUCONATE 0.12 % MT SOLN: 15.0000 mL | Freq: Two times a day (BID) | OROMUCOSAL | Status: DC

## 2014-12-11 MED ORDER — CEPHALEXIN 500 MG PO CAPS: 500.0000 mg | ORAL_CAPSULE | Freq: Three times a day (TID) | ORAL | Status: DC

## 2014-12-11 NOTE — ED Notes (Signed)
Broke left lower molar.  She would like antibiotic mouthwash and oral antibiotics until she can see her dentist. This is reasonable. Patient is allergic to penicillin but can tolerate cephalosporins.  Gregor Hams, MD 12/11/14 [phone]

## 2014-12-27 ENCOUNTER — Encounter: Payer: Self-pay | Admitting: Cardiology

## 2015-03-03 ENCOUNTER — Other Ambulatory Visit: Payer: Self-pay | Admitting: Cardiology

## 2015-03-18 ENCOUNTER — Ambulatory Visit: Payer: 59 | Admitting: Cardiology

## 2015-03-22 ENCOUNTER — Other Ambulatory Visit: Payer: Self-pay | Admitting: Cardiology

## 2015-04-06 NOTE — Progress Notes (Signed)
Cardiology Office Note   Date:  04/07/2015   ID:  KODIE PICK, DOB 1951-11-16, MRN [id number]  PCP:  Osborne Casco, MD  Cardiologist:  Dr. Cleatis Polka    Chief Complaint  Patient presents with  . Coronary Artery Disease     History of Present Illness: Courtney Steele is a 64 y.o. female with a hx of CAD s/p DES to LAD in 2012, HTN, HL.  Cardiac catheterization 08/2011 demonstrated patent LAD stent, FFR of LAD without hemodynamic significance and moderate disease in the ostial D1 (50-70%). Patient has been treated with Ranexa due to ongoing chest discomfort. Symptoms responded to this. Last seen by Dr. Ron Parker 03/2014.  She is here today for annual follow-up. 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 She remains quite busy. She denies chest pain, shortness of breath, syncope. She denies orthopnea, PND or edema. Last stress test in 2014 demonstrated no significant ischemia.  Studies/Reports Reviewed Today:  Myoview 4/14 The EF is 67%. There are no segmental wall motion abnormalities. Impression: Old small inferior wall scar. No significant ischemia.  Normal LV systolic function with no wall motion abnormalities.  LHC 9/12 LM: patent LAD: stent patent, mid 40-50%, D1 ostial 50-70% LCx:  Patent FFR of LAD: 0.92 at peak hyperemia. Not hemodynamically significant  Past Medical History  Diagnosis Date  . CAD (coronary artery disease)   . Hypertension   . Hyperlipidemia   . Borderline diabetes   . Morbid obesity   . Fibromyalgia   . Osteoarthritis   . Degenerative joint disease   . Groin hematoma   . Hematoma     Radial catheter site July 13, 2011  . Diabetes mellitus     Past Surgical History  Procedure Laterality Date  . Cardiac catheterization      revealing patency of the LAD stent with mild compromise of  the diagonal and moderate mid LAD stenosis, but does not appear to  be high grade.   . Cholecystectomy    . Tonsillectomy    . Carpal  tunnel release    . Bunionectomy    . Knee arthroscopy    . Abdominal hysterectomy    . Angioplasty       Current Outpatient Prescriptions  Medication Sig Dispense Refill  . albuterol (PROVENTIL HFA;VENTOLIN HFA) 108 (90 BASE) MCG/ACT inhaler Inhale 2 puffs into the lungs every 4 (four) hours as needed for wheezing or shortness of breath. 1 Inhaler 0  . amLODipine (NORVASC) 2.5 MG tablet TAKE 1 TABLET BY MOUTH DAILY. 90 tablet 1  . aspirin EC 81 MG tablet Take 81 mg by mouth daily.    Marland Kitchen atorvastatin (LIPITOR) 20 MG tablet TAKE 1 TABLET BY MOUTH DAILY. 90 tablet 1  . calcium carbonate 200 MG capsule Take 250 mg by mouth daily.      . clopidogrel (PLAVIX) 75 MG tablet TAKE 1 TABLET BY MOUTH DAILY. 90 tablet 1  . DULoxetine (CYMBALTA) 30 MG capsule Take 90 mg by mouth daily.     . ergocalciferol (VITAMIN D2) 50000 UNITS capsule Take 50,000 Units by mouth once a week. Take on Saturday    . isosorbide mononitrate (IMDUR) 30 MG 24 hr tablet TAKE 1/2 TABLET BY MOUTH DAILY. 90 tablet 1  . metoprolol succinate (TOPROL-XL) 25 MG 24 hr tablet Take 1 tablet (25 mg total) by mouth daily. 90 tablet 3  . naproxen (NAPROSYN) 500 MG tablet Take 1 tablet (500 mg total) by mouth  2 (two) times daily. 20 tablet 0  . nitroGLYCERIN (NITROSTAT) 0.4 MG SL tablet Place 1 tablet (0.4 mg total) under the tongue every 5 (five) minutes as needed. For chest pain 25 tablet 3  . ondansetron (ZOFRAN ODT) 8 MG disintegrating tablet Take 1 tablet (8 mg total) by mouth every 8 (eight) hours as needed for nausea or vomiting. 10 tablet 0  . pantoprazole (PROTONIX) 40 MG tablet TAKE 1 TABLET BY MOUTH DAILY 90 tablet 0  . pregabalin (LYRICA) 75 MG capsule Take 225 mg by mouth 2 (two) times daily.     . ranolazine (RANEXA) 500 MG 12 hr tablet Take 1 tablet (500 mg total) by mouth 2 (two) times daily. 180 tablet 3  . traMADol (ULTRAM) 50 MG tablet Take 50 mg by mouth 3 (three) times daily as needed. For pain    . [DISCONTINUED]  albuterol (PROVENTIL,VENTOLIN) 90 MCG/ACT inhaler Inhale 1-2 puffs into the lungs every 4 (four) hours as needed. For wheezing     No current facility-administered medications for this visit.    Allergies:   Benazepril; Erythromycin; Indomethacin; Biaxin; and Penicillins    Social History:  The patient  reports that she has never smoked. She has never used smokeless tobacco. She reports that she drinks alcohol. She reports that she does not use illicit drugs.   Family History:  The patient's family history includes Coronary artery disease (age of onset: 27) in her father; Other in her mother.    ROS:   Please see the history of present illness.   Review of Systems  All other systems reviewed and are negative.    PHYSICAL EXAM: VS:  BP 130/80 mmHg  Pulse 85  Ht 5\' 3"  (1.6 m)  Wt 215 lb 6.4 oz (97.705 kg)  BMI 38.17 kg/m2    Wt Readings from Last 3 Encounters:  04/07/15 215 lb 6.4 oz (97.705 kg)  03/16/14 227 lb 1.9 oz (103.021 kg)  10/29/13 215 lb (97.523 kg)     GEN: Well nourished, well developed, in no acute distress HEENT: normal Neck: no JVD, no carotid bruits, no masses Cardiac:  Normal S1/S2, RRR; no murmur ,  no rubs or gallops, no edema  Respiratory:  clear to auscultation bilaterally, no wheezing, rhonchi or rales. GI: soft, nontender, nondistended, + BS MS: no deformity or atrophy Skin: warm and dry  Neuro:  CNs II-XII intact, Strength and sensation are intact Psych: Normal affect   EKG:  EKG is ordered today.  It demonstrates:   NSR, HR 85, leftward axis, incomplete RBBB, no change from prior tracing   Recent Labs: No results found for requested labs within last 365 days.    Lipid Panel    Component Value Date/Time   CHOL 148 03/15/2013 0435   TRIG 195* 03/15/2013 0435   HDL 44 03/15/2013 0435   CHOLHDL 3.4 03/15/2013 0435   VLDL 39 03/15/2013 0435   LDLCALC 65 03/15/2013 0435      ASSESSMENT AND PLAN:  Coronary artery disease involving  native coronary artery of native heart without angina pectoris no angina. Continue aspirin, Plavix, amlodipine, nitrates, beta blocker, statin, Ranexa.  Essential hypertension Controlled.   Hyperlipidemia  Continue statin. Managed by primary care.  Current medicines are reviewed at length with the patient today.  Concerns regarding medicines are as outlined above.  The following changes have been made:  None  Labs/ tests ordered today include:    No orders of the defined types were placed in  this encounter.    Disposition:   FU with Dr. Ron Parker one year   Signed, Versie Starks, [facility] 04/07/2015 10:31 AM    [address], [place], [place]  [address] Phone: [phone]; Fax: [phone]

## 2015-04-07 ENCOUNTER — Encounter: Payer: Self-pay | Admitting: Physician Assistant

## 2015-04-07 ENCOUNTER — Ambulatory Visit (INDEPENDENT_AMBULATORY_CARE_PROVIDER_SITE_OTHER): Payer: 59 | Admitting: Physician Assistant

## 2015-04-07 VITALS — BP 130/80 | HR 85 | Ht 63.0 in | Wt 215.4 lb

## 2015-04-07 DIAGNOSIS — I2510 Atherosclerotic heart disease of native coronary artery without angina pectoris: Secondary | ICD-10-CM

## 2015-04-07 DIAGNOSIS — E785 Hyperlipidemia, unspecified: Secondary | ICD-10-CM | POA: Diagnosis not present

## 2015-04-07 DIAGNOSIS — I10 Essential (primary) hypertension: Secondary | ICD-10-CM | POA: Diagnosis not present

## 2015-04-07 MED ORDER — AMLODIPINE BESYLATE 2.5 MG PO TABS: 2.5000 mg | ORAL_TABLET | Freq: Every day | ORAL | Status: DC

## 2015-04-07 MED ORDER — NITROGLYCERIN 0.4 MG SL SUBL: 0.4000 mg | SUBLINGUAL_TABLET | SUBLINGUAL | Status: DC | PRN

## 2015-04-07 MED ORDER — ISOSORBIDE MONONITRATE ER 30 MG PO TB24: 15.0000 mg | ORAL_TABLET | Freq: Every day | ORAL | Status: DC

## 2015-04-07 MED ORDER — ATORVASTATIN CALCIUM 20 MG PO TABS: 20.0000 mg | ORAL_TABLET | Freq: Every day | ORAL | Status: DC

## 2015-04-07 MED ORDER — RANOLAZINE ER 500 MG PO TB12: 500.0000 mg | ORAL_TABLET | Freq: Two times a day (BID) | ORAL | Status: DC

## 2015-04-07 MED ORDER — CLOPIDOGREL BISULFATE 75 MG PO TABS: 75.0000 mg | ORAL_TABLET | Freq: Every day | ORAL | Status: DC

## 2015-04-07 MED ORDER — METOPROLOL SUCCINATE ER 25 MG PO TB24: 25.0000 mg | ORAL_TABLET | Freq: Every day | ORAL | Status: DC

## 2015-04-07 NOTE — Patient Instructions (Signed)
Medication Instructions:  Continue current medications.  Labwork: None   Testing/Procedures: None   Follow-Up: Dr. Cleatis Polka in 1 year.  Any Other Special Instructions Will Be Listed Below (If Applicable). None

## 2015-04-13 ENCOUNTER — Telehealth: Payer: Self-pay | Admitting: *Deleted

## 2015-04-13 NOTE — Telephone Encounter (Signed)
s/w pt's husband said pt was out right now. I advised I was just calling to let them know that Dr. Ron Parker, who is retireing; has said that he will have pt f/u with Dr. Angelena Form in about 1 yr. Husband verbalized understanding and said he will let pt know.

## 2015-05-02 ENCOUNTER — Encounter: Payer: Self-pay | Admitting: Cardiology

## 2015-05-05 ENCOUNTER — Telehealth: Payer: Self-pay | Admitting: *Deleted

## 2015-05-05 MED ORDER — ATORVASTATIN CALCIUM 40 MG PO TABS: 40.0000 mg | ORAL_TABLET | Freq: Every day | ORAL | Status: DC

## 2015-05-05 NOTE — Telephone Encounter (Signed)
Patient notified of lab results notation from Dr. Ron Parker. Patient verbalized understanding and agreement to increase dose of Lipitor to 40 mg daily. Rx called in to reflect new dosage.

## 2015-05-10 ENCOUNTER — Emergency Department (HOSPITAL_COMMUNITY): Payer: 59

## 2015-05-10 ENCOUNTER — Emergency Department (HOSPITAL_COMMUNITY)
Admission: EM | Admit: 2015-05-10 | Discharge: 2015-05-10 | Disposition: A | Discharge status: Home / Self Care | Payer: 59 | Attending: Emergency Medicine | Admitting: Emergency Medicine

## 2015-05-10 ENCOUNTER — Encounter (HOSPITAL_COMMUNITY): Payer: Self-pay

## 2015-05-10 DIAGNOSIS — T7840XA Allergy, unspecified, initial encounter: Secondary | ICD-10-CM | POA: Insufficient documentation

## 2015-05-10 DIAGNOSIS — Z7952 Long term (current) use of systemic steroids: Secondary | ICD-10-CM | POA: Insufficient documentation

## 2015-05-10 DIAGNOSIS — E119 Type 2 diabetes mellitus without complications: Secondary | ICD-10-CM | POA: Insufficient documentation

## 2015-05-10 DIAGNOSIS — Z880 Allergy status to penicillin: Secondary | ICD-10-CM | POA: Diagnosis not present

## 2015-05-10 DIAGNOSIS — Z9889 Other specified postprocedural states: Secondary | ICD-10-CM | POA: Insufficient documentation

## 2015-05-10 DIAGNOSIS — Y9289 Other specified places as the place of occurrence of the external cause: Secondary | ICD-10-CM | POA: Diagnosis not present

## 2015-05-10 DIAGNOSIS — R05 Cough: Secondary | ICD-10-CM | POA: Diagnosis not present

## 2015-05-10 DIAGNOSIS — M1990 Unspecified osteoarthritis, unspecified site: Secondary | ICD-10-CM | POA: Insufficient documentation

## 2015-05-10 DIAGNOSIS — Y9389 Activity, other specified: Secondary | ICD-10-CM | POA: Diagnosis not present

## 2015-05-10 DIAGNOSIS — Y998 Other external cause status: Secondary | ICD-10-CM | POA: Insufficient documentation

## 2015-05-10 DIAGNOSIS — Z7902 Long term (current) use of antithrombotics/antiplatelets: Secondary | ICD-10-CM | POA: Diagnosis not present

## 2015-05-10 DIAGNOSIS — E785 Hyperlipidemia, unspecified: Secondary | ICD-10-CM | POA: Insufficient documentation

## 2015-05-10 DIAGNOSIS — L500 Allergic urticaria: Secondary | ICD-10-CM | POA: Diagnosis not present

## 2015-05-10 DIAGNOSIS — I2510 Atherosclerotic heart disease of native coronary artery without angina pectoris: Secondary | ICD-10-CM | POA: Insufficient documentation

## 2015-05-10 DIAGNOSIS — Z7982 Long term (current) use of aspirin: Secondary | ICD-10-CM | POA: Diagnosis not present

## 2015-05-10 DIAGNOSIS — Z87828 Personal history of other (healed) physical injury and trauma: Secondary | ICD-10-CM | POA: Diagnosis not present

## 2015-05-10 DIAGNOSIS — X58XXXA Exposure to other specified factors, initial encounter: Secondary | ICD-10-CM | POA: Insufficient documentation

## 2015-05-10 DIAGNOSIS — I10 Essential (primary) hypertension: Secondary | ICD-10-CM | POA: Insufficient documentation

## 2015-05-10 DIAGNOSIS — Z791 Long term (current) use of non-steroidal anti-inflammatories (NSAID): Secondary | ICD-10-CM | POA: Diagnosis not present

## 2015-05-10 LAB — CBC WITH DIFFERENTIAL/PLATELET
BASOS PCT: 0 % (ref 0–1)
Basophils Absolute: 0 10*3/uL (ref 0.0–0.1)
EOS PCT: 1 % (ref 0–5)
Eosinophils Absolute: 0.1 10*3/uL (ref 0.0–0.7)
HEMATOCRIT: 41.1 % (ref 36.0–46.0)
Hemoglobin: 14 g/dL (ref 12.0–15.0)
Lymphocytes Relative: 18 % (ref 12–46)
Lymphs Abs: 1.7 10*3/uL (ref 0.7–4.0)
MCH: 29.7 pg (ref 26.0–34.0)
MCHC: 34.1 g/dL (ref 30.0–36.0)
MCV: 87.3 fL (ref 78.0–100.0)
Monocytes Absolute: 0.3 10*3/uL (ref 0.1–1.0)
Monocytes Relative: 4 % (ref 3–12)
NEUTROS PCT: 77 % (ref 43–77)
Neutro Abs: 7.4 10*3/uL (ref 1.7–7.7)
Platelets: 199 10*3/uL (ref 150–400)
RBC: 4.71 MIL/uL (ref 3.87–5.11)
RDW: 14.4 % (ref 11.5–15.5)
WBC: 9.5 10*3/uL (ref 4.0–10.5)

## 2015-05-10 LAB — COMPREHENSIVE METABOLIC PANEL
ALK PHOS: 69 U/L (ref 38–126)
ALT: 18 U/L (ref 14–54)
ANION GAP: 12 (ref 5–15)
AST: 23 U/L (ref 15–41)
Albumin: 3.8 g/dL (ref 3.5–5.0)
BILIRUBIN TOTAL: 1.1 mg/dL (ref 0.3–1.2)
BUN: 24 mg/dL — AB (ref 6–20)
CO2: 23 mmol/L (ref 22–32)
CREATININE: 0.88 mg/dL (ref 0.44–1.00)
Calcium: 9.3 mg/dL (ref 8.9–10.3)
Chloride: 104 mmol/L (ref 101–111)
GFR calc non Af Amer: >60 mL/min (ref >60)
GLUCOSE: 159 mg/dL — AB (ref 65–99)
Potassium: 3.6 mmol/L (ref 3.5–5.1)
Sodium: 139 mmol/L (ref 135–145)
Total Protein: 6.6 g/dL (ref 6.5–8.1)

## 2015-05-10 MED ORDER — ALBUTEROL SULFATE HFA 108 (90 BASE) MCG/ACT IN AERS: 1.0000 | INHALATION_SPRAY | Freq: Four times a day (QID) | RESPIRATORY_TRACT | Status: AC | PRN

## 2015-05-10 MED ORDER — EPINEPHRINE 0.3 MG/0.3ML IJ SOAJ: 0.3000 mg | Freq: Once | INTRAMUSCULAR | Status: DC

## 2015-05-10 MED ORDER — PREDNISONE 20 MG PO TABS: 60.0000 mg | ORAL_TABLET | Freq: Every day | ORAL | Status: DC

## 2015-05-10 MED ORDER — DIPHENHYDRAMINE HCL 50 MG/ML IJ SOLN
25.0000 mg | Freq: Once | INTRAMUSCULAR | Status: AC
  Administered 2015-05-10: 25 mg via INTRAVENOUS
  Filled 2015-05-10: qty 1

## 2015-05-10 MED ORDER — SODIUM CHLORIDE 0.9 % IV BOLUS (SEPSIS)
1000.0000 mL | Freq: Once | INTRAVENOUS | Status: AC
  Administered 2015-05-10: 1000 mL via INTRAVENOUS

## 2015-05-10 MED ORDER — RANITIDINE HCL 15 MG/ML PO SYRP
75.0000 mg | ORAL_SOLUTION | Freq: Once | ORAL | Status: AC
  Administered 2015-05-10: 75 mg via ORAL
  Filled 2015-05-10: qty 5

## 2015-05-10 MED ORDER — METHYLPREDNISOLONE SODIUM SUCC 125 MG IJ SOLR
125.0000 mg | Freq: Once | INTRAMUSCULAR | Status: AC
  Administered 2015-05-10: 125 mg via INTRAVENOUS
  Filled 2015-05-10: qty 2

## 2015-05-10 NOTE — ED Provider Notes (Signed)
CSN: [id number]     Arrival date & time 05/10/15  3244 History   First MD Initiated Contact with Patient 05/10/15 [phone]     Chief Complaint  Patient presents with  . Allergic Reaction     (Consider location/radiation/quality/duration/timing/severity/associated sxs/prior Treatment) Patient is a 64 y.o. female presenting with allergic reaction.  Allergic Reaction Presenting symptoms: itching and rash   Presenting symptoms comment:  Cough Severity:  Moderate Prior allergic episodes:  Seasonal allergies Context: food and grass   Context: no animal exposure   Context comment:  Pt was outside, ate a large steak, no obvious new exposures Relieved by:  Nothing Worsened by:  Nothing tried Ineffective treatments:  Antihistamines   Past Medical History  Diagnosis Date  . CAD (coronary artery disease)   . Hypertension   . Hyperlipidemia   . Borderline diabetes   . Morbid obesity   . Fibromyalgia   . Osteoarthritis   . Degenerative joint disease   . Groin hematoma   . Hematoma     Radial catheter site July 13, 2011  . Diabetes mellitus    Past Surgical History  Procedure Laterality Date  . Cardiac catheterization      revealing patency of the LAD stent with mild compromise of  the diagonal and moderate mid LAD stenosis, but does not appear to  be high grade.   . Cholecystectomy    . Tonsillectomy    . Carpal tunnel release    . Bunionectomy    . Knee arthroscopy    . Abdominal hysterectomy    . Angioplasty     Family History  Problem Relation Age of Onset  . Other Mother     died pulmonary issues  . Coronary artery disease Father 40    died- CABG-HTN- AAA   History  Substance Use Topics  . Smoking status: Never Smoker   . Smokeless tobacco: Never Used  . Alcohol Use: Yes     Comment: occasional   OB History    No data available     Review of Systems  Skin: Positive for itching and rash.  All other systems reviewed and are negative.     Allergies   Benazepril; Erythromycin; Indomethacin; Biaxin; and Penicillins  Home Medications   Prior to Admission medications   Medication Sig Start Date End Date Taking? Authorizing Provider  amLODipine (NORVASC) 2.5 MG tablet Take 1 tablet (2.5 mg total) by mouth daily. 04/07/15  Yes Liliane Shi, PA-C  aspirin EC 81 MG tablet Take 81 mg by mouth daily.   Yes Historical Provider, MD  atorvastatin (LIPITOR) 40 MG tablet Take 1 tablet (40 mg total) by mouth daily. 05/05/15  Yes Carlena Bjornstad, MD  calcium carbonate 200 MG capsule Take 250 mg by mouth daily.     Yes Historical Provider, MD  clopidogrel (PLAVIX) 75 MG tablet Take 1 tablet (75 mg total) by mouth daily. 04/07/15  Yes Scott Joylene Draft, PA-C  DULoxetine (CYMBALTA) 30 MG capsule Take 90 mg by mouth daily.    Yes Historical Provider, MD  ergocalciferol (VITAMIN D2) 50000 UNITS capsule Take 50,000 Units by mouth once a week. Take on Saturday   Yes Historical Provider, MD  isosorbide mononitrate (IMDUR) 30 MG 24 hr tablet Take 0.5 tablets (15 mg total) by mouth daily. 04/07/15  Yes Scott Joylene Draft, PA-C  metoprolol succinate (TOPROL-XL) 25 MG 24 hr tablet Take 1 tablet (25 mg total) by mouth daily. 04/07/15  Yes Liliane Shi,  PA-C  naproxen (NAPROSYN) 500 MG tablet Take 1 tablet (500 mg total) by mouth 2 (two) times daily. 05/19/14  Yes Jola Schmidt, MD  pantoprazole (PROTONIX) 40 MG tablet TAKE 1 TABLET BY MOUTH DAILY 03/22/15  Yes Carlena Bjornstad, MD  pregabalin (LYRICA) 75 MG capsule Take 225 mg by mouth 2 (two) times daily.    Yes Historical Provider, MD  ranolazine (RANEXA) 500 MG 12 hr tablet Take 1 tablet (500 mg total) by mouth 2 (two) times daily. 04/07/15 04/06/16 Yes Scott T Weaver, PA-C  traMADol (ULTRAM) 50 MG tablet Take 50 mg by mouth 3 (three) times daily as needed. For pain   Yes Historical Provider, MD  albuterol (PROVENTIL HFA;VENTOLIN HFA) 108 (90 BASE) MCG/ACT inhaler Inhale 1-2 puffs into the lungs every 6 (six) hours as needed for  wheezing or shortness of breath. 05/10/15   Debby Freiberg, MD  EPINEPHrine 0.3 mg/0.3 mL IJ SOAJ injection Inject 0.3 mLs (0.3 mg total) into the muscle once. 05/10/15   Debby Freiberg, MD  nitroGLYCERIN (NITROSTAT) 0.4 MG SL tablet Place 1 tablet (0.4 mg total) under the tongue every 5 (five) minutes as needed. For chest pain 04/07/15   Liliane Shi, PA-C  ondansetron (ZOFRAN ODT) 8 MG disintegrating tablet Take 1 tablet (8 mg total) by mouth every 8 (eight) hours as needed for nausea or vomiting. 05/19/14   Jola Schmidt, MD  predniSONE (DELTASONE) 20 MG tablet Take 3 tablets (60 mg total) by mouth daily with breakfast. 05/10/15   Debby Freiberg, MD   BP 132/61 mmHg  Pulse 93  Temp(Src) 99.6 F (37.6 C) (Oral)  Resp 23  Ht 5\' 2"  (1.575 m)  Wt 210 lb (95.255 kg)  BMI 38.40 kg/m2  SpO2 95% Physical Exam  Constitutional: She is oriented to person, place, and time. She appears well-developed and well-nourished.  HENT:  Head: Normocephalic and atraumatic.  Right Ear: External ear normal.  Left Ear: External ear normal.  Eyes: Conjunctivae and EOM are normal. Pupils are equal, round, and reactive to light.  Neck: Normal range of motion. Neck supple.  Cardiovascular: Normal rate, regular rhythm, normal heart sounds and intact distal pulses.   Pulmonary/Chest: Effort normal and breath sounds normal.  Abdominal: Soft. Bowel sounds are normal. There is no tenderness.  Musculoskeletal: Normal range of motion.  Neurological: She is alert and oriented to person, place, and time.  Skin: Skin is warm and dry. Rash (truncal, face) noted. Rash is urticarial.  Vitals reviewed.   ED Course  Procedures (including critical care time) Labs Review Labs Reviewed  COMPREHENSIVE METABOLIC PANEL - Abnormal; Notable for the following:    Glucose, Bld 159 (*)    BUN 24 (*)    All other components within normal limits  CBC WITH DIFFERENTIAL/PLATELET    Imaging Review Dg Chest 2 View  05/10/2015    CLINICAL DATA:  64 year old female with urticaria and pruritus concerning for systemic allergic reaction  EXAM: CHEST  2 VIEW  COMPARISON:  Prior chest x-ray 04/27/2014  FINDINGS: The lungs are clear and negative for focal airspace consolidation, pulmonary edema or suspicious pulmonary nodule. No pleural effusion or pneumothorax. Cardiac and mediastinal contours are within normal limits. No acute fracture or lytic or blastic osseous lesions. The visualized upper abdominal bowel gas pattern is unremarkable.  IMPRESSION: No active cardiopulmonary disease.   Electronically Signed   By: Jacqulynn Cadet M.D.   On: 05/10/2015 08:21     EKG Interpretation None  MDM   Final diagnoses:  Allergic reaction, initial encounter    64 y.o. female with pertinent PMH of seasonal allergies, CAD, HTN, DM presents with urticarial rash and hives beginning ~5 hours ago.  No ho similar attacks, but does have seasonal allergies. Patient attempted to use Claritin, Allegra, Zyrtec, Benadryl for her symptoms without relief. On arrival patient has generalized urticaria most prominently over the neck and trunk. No wheezing, dyspnea on arrival. She was given Benadryl, Zantac, Solu-Medrol and had relief of symptoms.  Workup unremarkable. Strict return precautions given for recurrence of allergic symptoms, patient voiced understanding and agreed to follow-up. Discharged home in stable condition.  I have reviewed all laboratory and imaging studies if ordered as above  1. Allergic reaction, initial encounter         Debby Freiberg, MD 05/10/15 1002

## 2015-05-10 NOTE — ED Notes (Signed)
Pt reports that her head was itching and redness on the face and neck. Itching across entire body. Hives noted on across neck and chest area. Pt reports hives on legs last night, none noted at this time. No shortness of breath or distress noted.

## 2015-05-10 NOTE — Discharge Instructions (Signed)

## 2015-06-15 ENCOUNTER — Other Ambulatory Visit: Payer: Self-pay | Admitting: Cardiology

## 2015-06-16 ENCOUNTER — Other Ambulatory Visit: Payer: Self-pay

## 2015-06-16 MED ORDER — PANTOPRAZOLE SODIUM 40 MG PO TBEC: 40.0000 mg | DELAYED_RELEASE_TABLET | Freq: Every day | ORAL | Status: DC

## 2015-08-08 ENCOUNTER — Ambulatory Visit (INDEPENDENT_AMBULATORY_CARE_PROVIDER_SITE_OTHER): Payer: 59 | Admitting: Cardiology

## 2015-08-08 ENCOUNTER — Encounter: Payer: Self-pay | Admitting: Cardiology

## 2015-08-08 VITALS — BP 118/80 | HR 86 | Ht 62.0 in | Wt 221.6 lb

## 2015-08-08 DIAGNOSIS — I2510 Atherosclerotic heart disease of native coronary artery without angina pectoris: Secondary | ICD-10-CM

## 2015-08-08 DIAGNOSIS — T7800XD Anaphylactic reaction due to unspecified food, subsequent encounter: Secondary | ICD-10-CM

## 2015-08-08 DIAGNOSIS — T7800XA Anaphylactic reaction due to unspecified food, initial encounter: Secondary | ICD-10-CM | POA: Insufficient documentation

## 2015-08-08 NOTE — Patient Instructions (Signed)
Medication Instructions:  Same-No change  Labwork: None  Testing/Procedures: None  Follow-Up: Your physician wants you to follow-up in: 6 months with Dr Johnsie Cancel. You will receive a reminder letter in the mail two months in advance. If you don't receive a letter, please call our office to schedule the follow-up appointment.

## 2015-08-08 NOTE — Progress Notes (Signed)
Cardiology Office Note   Date:  08/08/2015   ID:  Courtney, Steele 04-Sep-1951, MRN [id number]  PCP:  Osborne Casco, MD  Cardiologist:  Dola Argyle, MD   Chief Complaint  Patient presents with  . Appointment    Follow-up coronary artery disease      History of Present Illness: Courtney Steele is a 64 y.o. female who presents to follow-up coronary disease. Her cardiac status is stable. She had an unusual allergic reaction recently. After full evaluation, she was found to be allergic to all meat protein. Her diet has to be overseen very carefully. Fortunately she is not having any chest pain.  The patient is aware that I am retiring on September 09, 2015. She requests follow-up with Dr. Johnsie Cancel. This will be arranged.    Past Medical History  Diagnosis Date  . CAD (coronary artery disease)   . Hypertension   . Hyperlipidemia   . Borderline diabetes   . Morbid obesity   . Fibromyalgia   . Osteoarthritis   . Degenerative joint disease   . Groin hematoma   . Hematoma     Radial catheter site July 13, 2011  . Diabetes mellitus     Past Surgical History  Procedure Laterality Date  . Cardiac catheterization      revealing patency of the LAD stent with mild compromise of  the diagonal and moderate mid LAD stenosis, but does not appear to  be high grade.   . Cholecystectomy    . Tonsillectomy    . Carpal tunnel release    . Bunionectomy    . Knee arthroscopy    . Abdominal hysterectomy    . Angioplasty      Patient Active Problem List   Diagnosis Date Noted  . Osteoarthritis   . Midsternal chest pain 02/23/2012  . Dizziness 10/29/2011  . CAD (coronary artery disease)   . Hypertension   . Hyperlipidemia   . Fibromyalgia   . Groin hematoma   . Hematoma       Current Outpatient Prescriptions  Medication Sig Dispense Refill  . albuterol (PROVENTIL HFA;VENTOLIN HFA) 108 (90 BASE) MCG/ACT inhaler Inhale 1-2 puffs into the lungs every 6 (six) hours as  needed for wheezing or shortness of breath. 1 Inhaler 0  . amLODipine (NORVASC) 2.5 MG tablet Take 1 tablet (2.5 mg total) by mouth daily. 90 tablet 3  . aspirin EC 81 MG tablet Take 81 mg by mouth daily.    Marland Kitchen atorvastatin (LIPITOR) 40 MG tablet Take 1 tablet (40 mg total) by mouth daily. 90 tablet 3  . calcium carbonate 200 MG capsule Take 250 mg by mouth daily.      . clopidogrel (PLAVIX) 75 MG tablet Take 1 tablet (75 mg total) by mouth daily. 90 tablet 3  . DULoxetine (CYMBALTA) 30 MG capsule Take 90 mg by mouth daily.     Marland Kitchen EPINEPHrine 0.3 mg/0.3 mL IJ SOAJ injection Inject 0.3 mLs (0.3 mg total) into the muscle once. 1 Device 0  . ergocalciferol (VITAMIN D2) 50000 UNITS capsule Take 50,000 Units by mouth once a week. Take on Saturday    . isosorbide mononitrate (IMDUR) 30 MG 24 hr tablet Take 0.5 tablets (15 mg total) by mouth daily. 90 tablet 3  . metoprolol succinate (TOPROL-XL) 25 MG 24 hr tablet Take 1 tablet (25 mg total) by mouth daily. 90 tablet 3  . naproxen (NAPROSYN) 500 MG tablet Take 1 tablet (500 mg total) by  mouth 2 (two) times daily. 20 tablet 0  . nitroGLYCERIN (NITROSTAT) 0.4 MG SL tablet Place 1 tablet (0.4 mg total) under the tongue every 5 (five) minutes as needed. For chest pain 25 tablet 3  . ondansetron (ZOFRAN ODT) 8 MG disintegrating tablet Take 1 tablet (8 mg total) by mouth every 8 (eight) hours as needed for nausea or vomiting. 10 tablet 0  . pantoprazole (PROTONIX) 40 MG tablet Take 1 tablet (40 mg total) by mouth daily. 30 tablet 0  . predniSONE (DELTASONE) 20 MG tablet Take 3 tablets (60 mg total) by mouth daily with breakfast. 15 tablet 0  . pregabalin (LYRICA) 75 MG capsule Take 225 mg by mouth 2 (two) times daily.     . ranolazine (RANEXA) 500 MG 12 hr tablet Take 1 tablet (500 mg total) by mouth 2 (two) times daily. 180 tablet 3  . traMADol (ULTRAM) 50 MG tablet Take 50 mg by mouth 3 (three) times daily as needed. For pain    . [DISCONTINUED] albuterol  (PROVENTIL,VENTOLIN) 90 MCG/ACT inhaler Inhale 1-2 puffs into the lungs every 4 (four) hours as needed. For wheezing     No current facility-administered medications for this visit.    Allergies:   Benazepril; Erythromycin; Indomethacin; Biaxin; and Penicillins    Social History:  The patient  reports that she has never smoked. She has never used smokeless tobacco. She reports that she drinks alcohol. She reports that she does not use illicit drugs.   Family History:  The patient's family history includes Coronary artery disease (age of onset: 24) in her father; Other in her mother.    ROS:  Please see the history of present illness.       Patient denies fever, chills, headache, sweats, rash, change in vision, change in hearing, chest pain, cough, nausea or vomiting, urinary symptoms. All other systems are reviewed and are negative.   PHYSICAL EXAM: VS:  BP 118/80 mmHg  Pulse 86  Ht 5\' 2"  (1.575 m)  Wt 221 lb 9.6 oz (100.517 kg)  BMI 40.52 kg/m2  SpO2 96% , Patient is overweight but stable. She is oriented to person time and place. Affect is normal. Head is atraumatic. Sclera and conjunctiva are normal. There is no jugulovenous distention. Lungs are clear. Respiratory effort is nonlabored. Cardiac exam reveals an S1 and S2. Abdomen is soft. There is no peripheral edema. There are no musculoskeletal deformities. There are no skin rashes.  EKG:   EKG is not done today.   Recent Labs: 05/10/2015: ALT 18; BUN 24*; Creatinine, Ser 0.88; Hemoglobin 14.0; Platelets 199; Potassium 3.6; Sodium 139    Lipid Panel    Component Value Date/Time   CHOL 148 03/15/2013 0435   TRIG 195* 03/15/2013 0435   HDL 44 03/15/2013 0435   CHOLHDL 3.4 03/15/2013 0435   VLDL 39 03/15/2013 0435   LDLCALC 65 03/15/2013 0435      Wt Readings from Last 3 Encounters:  08/08/15 221 lb 9.6 oz (100.517 kg)  05/10/15 210 lb (95.255 kg)  04/07/15 215 lb 6.4 oz (97.705 kg)      Current medicines are  reviewed  The patient understands her medications.     ASSESSMENT AND PLAN:

## 2015-08-08 NOTE — Assessment & Plan Note (Signed)
The patient developed a severe allergic reaction earlier this year. Eventually it was felt to be related to meat protein. She is under the care of an allergist. She had a severe reaction and has to watch her diet very carefully.

## 2015-08-08 NOTE — Assessment & Plan Note (Signed)
The patient's coronary disease and stable. Nuclear in April, 2014 revealed a small inferior scar. There was no ischemia. LV function is normal. No further workup. She will remain on her current medications.

## 2015-09-05 ENCOUNTER — Other Ambulatory Visit: Payer: Self-pay | Admitting: Cardiology

## 2015-09-06 ENCOUNTER — Other Ambulatory Visit: Payer: Self-pay | Admitting: Cardiology

## 2015-09-22 ENCOUNTER — Telehealth: Payer: Self-pay

## 2015-09-22 NOTE — Telephone Encounter (Signed)
Patient Called today to let us know she will fax over a form from [facility]. This form is to see if patient can be exempt from taking the influenza. I will put the form and chart in the call box.   Please Advise  Thanks

## 2015-09-23 NOTE — Telephone Encounter (Signed)
Spoke with patient states she is allergic to all mammal protein tested positive for Alpha-Gal, states she was advised flu shot prepared with pork gelatin, which is not on form for flu shot at work. Patient is hesitant in taking the flu shot and having an allergic reaction advised we would call back once Dr. Ishmael Holter reviews chart.

## 2015-09-30 NOTE — Telephone Encounter (Signed)
Phone call to patient to inform will need to review with Employee [employer] need for exemption.  Unable to leave message on voicemail.

## 2015-10-12 ENCOUNTER — Other Ambulatory Visit (HOSPITAL_COMMUNITY): Payer: Self-pay | Admitting: Sports Medicine

## 2015-10-12 DIAGNOSIS — M25561 Pain in right knee: Secondary | ICD-10-CM

## 2015-10-14 ENCOUNTER — Ambulatory Visit (HOSPITAL_COMMUNITY)
Admission: RE | Admit: 2015-10-14 | Discharge: 2015-10-14 | Disposition: A | Discharge status: Home / Self Care | Payer: 59 | Source: Ambulatory Visit | Attending: Sports Medicine | Admitting: Sports Medicine

## 2015-10-14 DIAGNOSIS — M23303 Other meniscus derangements, unspecified medial meniscus, right knee: Secondary | ICD-10-CM | POA: Insufficient documentation

## 2015-10-14 DIAGNOSIS — M25561 Pain in right knee: Secondary | ICD-10-CM | POA: Insufficient documentation

## 2015-10-14 DIAGNOSIS — M23300 Other meniscus derangements, unspecified lateral meniscus, right knee: Secondary | ICD-10-CM | POA: Diagnosis not present

## 2015-10-14 DIAGNOSIS — M948X6 Other specified disorders of cartilage, lower leg: Secondary | ICD-10-CM | POA: Insufficient documentation

## 2015-10-28 ENCOUNTER — Encounter (HOSPITAL_COMMUNITY): Payer: Self-pay | Admitting: *Deleted

## 2015-10-28 ENCOUNTER — Emergency Department (HOSPITAL_COMMUNITY): Payer: 59

## 2015-10-28 ENCOUNTER — Inpatient Hospital Stay (HOSPITAL_COMMUNITY)
Admission: EM | Admit: 2015-10-28 | Discharge: 2015-10-31 | DRG: 287 | Disposition: A | Discharge status: Home / Self Care | Payer: 59 | Attending: Cardiovascular Disease | Admitting: Cardiovascular Disease

## 2015-10-28 DIAGNOSIS — I4510 Unspecified right bundle-branch block: Secondary | ICD-10-CM | POA: Diagnosis present

## 2015-10-28 DIAGNOSIS — Z7902 Long term (current) use of antithrombotics/antiplatelets: Secondary | ICD-10-CM

## 2015-10-28 DIAGNOSIS — I200 Unstable angina: Secondary | ICD-10-CM | POA: Diagnosis not present

## 2015-10-28 DIAGNOSIS — Z880 Allergy status to penicillin: Secondary | ICD-10-CM

## 2015-10-28 DIAGNOSIS — I25110 Atherosclerotic heart disease of native coronary artery with unstable angina pectoris: Principal | ICD-10-CM | POA: Diagnosis present

## 2015-10-28 DIAGNOSIS — M797 Fibromyalgia: Secondary | ICD-10-CM | POA: Diagnosis present

## 2015-10-28 DIAGNOSIS — Z955 Presence of coronary angioplasty implant and graft: Secondary | ICD-10-CM | POA: Diagnosis not present

## 2015-10-28 DIAGNOSIS — E119 Type 2 diabetes mellitus without complications: Secondary | ICD-10-CM | POA: Diagnosis present

## 2015-10-28 DIAGNOSIS — Z8249 Family history of ischemic heart disease and other diseases of the circulatory system: Secondary | ICD-10-CM

## 2015-10-28 DIAGNOSIS — R079 Chest pain, unspecified: Secondary | ICD-10-CM | POA: Insufficient documentation

## 2015-10-28 DIAGNOSIS — Z7982 Long term (current) use of aspirin: Secondary | ICD-10-CM | POA: Diagnosis not present

## 2015-10-28 DIAGNOSIS — I2510 Atherosclerotic heart disease of native coronary artery without angina pectoris: Secondary | ICD-10-CM | POA: Diagnosis present

## 2015-10-28 DIAGNOSIS — Z6839 Body mass index (BMI) 39.0-39.9, adult: Secondary | ICD-10-CM | POA: Diagnosis not present

## 2015-10-28 DIAGNOSIS — Z881 Allergy status to other antibiotic agents status: Secondary | ICD-10-CM | POA: Diagnosis not present

## 2015-10-28 DIAGNOSIS — E785 Hyperlipidemia, unspecified: Secondary | ICD-10-CM | POA: Diagnosis present

## 2015-10-28 DIAGNOSIS — Z79899 Other long term (current) drug therapy: Secondary | ICD-10-CM

## 2015-10-28 DIAGNOSIS — Z9861 Coronary angioplasty status: Secondary | ICD-10-CM

## 2015-10-28 DIAGNOSIS — R7303 Prediabetes: Secondary | ICD-10-CM

## 2015-10-28 DIAGNOSIS — Z91018 Allergy to other foods: Secondary | ICD-10-CM

## 2015-10-28 DIAGNOSIS — I10 Essential (primary) hypertension: Secondary | ICD-10-CM | POA: Diagnosis present

## 2015-10-28 LAB — BASIC METABOLIC PANEL
Anion gap: 11 (ref 5–15)
BUN: 13 mg/dL (ref 6–20)
CO2: 26 mmol/L (ref 22–32)
CREATININE: 0.73 mg/dL (ref 0.44–1.00)
Calcium: 9 mg/dL (ref 8.9–10.3)
Chloride: 105 mmol/L (ref 101–111)
GFR calc non Af Amer: >60 mL/min (ref >60)
Glucose, Bld: 130 mg/dL — ABNORMAL HIGH (ref 65–99)
Potassium: 3.7 mmol/L (ref 3.5–5.1)
Sodium: 142 mmol/L (ref 135–145)

## 2015-10-28 LAB — CBC
HCT: 37.9 % (ref 36.0–46.0)
HEMOGLOBIN: 12.8 g/dL (ref 12.0–15.0)
MCH: 30.8 pg (ref 26.0–34.0)
MCHC: 33.8 g/dL (ref 30.0–36.0)
MCV: 91.3 fL (ref 78.0–100.0)
Platelets: 196 10*3/uL (ref 150–400)
RBC: 4.15 MIL/uL (ref 3.87–5.11)
RDW: 14.6 % (ref 11.5–15.5)
WBC: 5 10*3/uL (ref 4.0–10.5)

## 2015-10-28 LAB — GLUCOSE, CAPILLARY
Glucose-Capillary: 91 mg/dL (ref 65–99)
Glucose-Capillary: 128 mg/dL — ABNORMAL HIGH (ref 65–99)

## 2015-10-28 LAB — TROPONIN I

## 2015-10-28 LAB — I-STAT TROPONIN, ED: Troponin i, poc: 0 ng/mL (ref 0.00–0.08)

## 2015-10-28 MED ORDER — TRAMADOL HCL 50 MG PO TABS: 50.0000 mg | ORAL_TABLET | Freq: Two times a day (BID) | ORAL | Status: DC | PRN

## 2015-10-28 MED ORDER — ONDANSETRON HCL 4 MG/2ML IJ SOLN: 4.0000 mg | Freq: Four times a day (QID) | INTRAMUSCULAR | Status: DC | PRN

## 2015-10-28 MED ORDER — ASPIRIN EC 81 MG PO TBEC
81.0000 mg | DELAYED_RELEASE_TABLET | Freq: Every day | ORAL | Status: DC
  Administered 2015-10-28 – 2015-10-30 (×3): 81 mg via ORAL
  Filled 2015-10-28 (×3): qty 1

## 2015-10-28 MED ORDER — ALBUTEROL SULFATE (2.5 MG/3ML) 0.083% IN NEBU: 3.0000 mL | INHALATION_SOLUTION | Freq: Four times a day (QID) | RESPIRATORY_TRACT | Status: DC | PRN

## 2015-10-28 MED ORDER — NITROGLYCERIN IN D5W 200-5 MCG/ML-% IV SOLN
3.0000 ug/min | INTRAVENOUS | Status: DC
  Administered 2015-10-28: 10 ug/min via INTRAVENOUS
  Administered 2015-10-31: 15 ug/min via INTRAVENOUS
  Filled 2015-10-28 (×3): qty 250

## 2015-10-28 MED ORDER — CLOPIDOGREL BISULFATE 75 MG PO TABS
75.0000 mg | ORAL_TABLET | Freq: Every day | ORAL | Status: DC
  Administered 2015-10-28 – 2015-10-31 (×4): 75 mg via ORAL
  Filled 2015-10-28 (×4): qty 1

## 2015-10-28 MED ORDER — ENOXAPARIN SODIUM 40 MG/0.4ML ~~LOC~~ SOLN
40.0000 mg | SUBCUTANEOUS | Status: DC
Start: 2015-10-28 — End: 2015-10-31
  Administered 2015-10-28 – 2015-10-30 (×3): 40 mg via SUBCUTANEOUS
  Filled 2015-10-28 (×3): qty 0.4

## 2015-10-28 MED ORDER — ONDANSETRON 8 MG PO TBDP
8.0000 mg | ORAL_TABLET | Freq: Three times a day (TID) | ORAL | Status: DC | PRN
  Filled 2015-10-28: qty 1

## 2015-10-28 MED ORDER — PREGABALIN 50 MG PO CAPS
225.0000 mg | ORAL_CAPSULE | Freq: Two times a day (BID) | ORAL | Status: DC
  Administered 2015-10-28 – 2015-10-31 (×6): 225 mg via ORAL
  Filled 2015-10-28 (×2): qty 1
  Filled 2015-10-28: qty 4
  Filled 2015-10-28 (×3): qty 1

## 2015-10-28 MED ORDER — ACETAMINOPHEN 325 MG PO TABS
650.0000 mg | ORAL_TABLET | ORAL | Status: DC | PRN
  Administered 2015-10-30 – 2015-10-31 (×2): 650 mg via ORAL
  Filled 2015-10-28 (×3): qty 2

## 2015-10-28 MED ORDER — PANTOPRAZOLE SODIUM 40 MG PO TBEC
40.0000 mg | DELAYED_RELEASE_TABLET | Freq: Every day | ORAL | Status: DC
  Administered 2015-10-28 – 2015-10-31 (×4): 40 mg via ORAL
  Filled 2015-10-28 (×4): qty 1

## 2015-10-28 MED ORDER — NITROGLYCERIN 0.4 MG SL SUBL: 0.4000 mg | SUBLINGUAL_TABLET | SUBLINGUAL | Status: DC | PRN

## 2015-10-28 MED ORDER — AMLODIPINE BESYLATE 2.5 MG PO TABS
2.5000 mg | ORAL_TABLET | Freq: Every day | ORAL | Status: DC
  Administered 2015-10-28 – 2015-10-31 (×4): 2.5 mg via ORAL
  Filled 2015-10-28 (×4): qty 1

## 2015-10-28 MED ORDER — ATORVASTATIN CALCIUM 40 MG PO TABS
40.0000 mg | ORAL_TABLET | Freq: Every evening | ORAL | Status: DC
  Administered 2015-10-28 – 2015-10-31 (×4): 40 mg via ORAL
  Filled 2015-10-28 (×4): qty 1

## 2015-10-28 MED ORDER — DULOXETINE HCL 60 MG PO CPEP
90.0000 mg | ORAL_CAPSULE | Freq: Every day | ORAL | Status: DC
  Administered 2015-10-28 – 2015-10-31 (×4): 90 mg via ORAL
  Filled 2015-10-28 (×4): qty 1

## 2015-10-28 MED ORDER — RANOLAZINE ER 500 MG PO TB12
500.0000 mg | ORAL_TABLET | Freq: Two times a day (BID) | ORAL | Status: DC
  Administered 2015-10-28 – 2015-10-31 (×6): 500 mg via ORAL
  Filled 2015-10-28 (×6): qty 1

## 2015-10-28 MED ORDER — METOPROLOL SUCCINATE ER 25 MG PO TB24
25.0000 mg | ORAL_TABLET | Freq: Every evening | ORAL | Status: DC
  Administered 2015-10-28 – 2015-10-31 (×4): 25 mg via ORAL
  Filled 2015-10-28 (×4): qty 1

## 2015-10-28 NOTE — ED Provider Notes (Signed)
CSN: [id number]     Arrival date & time 10/28/15  1301 History   First MD Initiated Contact with Patient 10/28/15 1346     Chief Complaint  Patient presents with  . Chest Pain  . Dizziness     (Consider location/radiation/quality/duration/timing/severity/associated sxs/prior Treatment) HPI  Patient is a 64 year old female with history of CAD, DM, HTN, HLD who presents with chest pain. Patient was driving into work when she first noticed substernal chest pain radiating to her chin and left arm. Pain described as a pressure-like sensation. She then took a nitroglycerin tablet which did not relieve her pain and caused her to become dizzy. Currently says her chest pain is "about the same." No shortness of breath. No nausea or vomiting. No fevers or chills. Feels increased weakness with exertion, but no worsened chest pain or shortness of breath.   Past Medical History  Diagnosis Date  . CAD (coronary artery disease)     a. 2012: DES to LAD   . Hypertension   . Hyperlipidemia   . Borderline diabetes   . Morbid obesity ([place])   . Fibromyalgia   . Osteoarthritis   . Degenerative joint disease   . Groin hematoma   . Hematoma     Radial catheter site July 13, 2011  . Diabetes mellitus    Past Surgical History  Procedure Laterality Date  . Cardiac catheterization      revealing patency of the LAD stent with mild compromise of  the diagonal and moderate mid LAD stenosis, but does not appear to  be high grade.   . Cholecystectomy    . Tonsillectomy    . Carpal tunnel release    . Bunionectomy    . Knee arthroscopy    . Abdominal hysterectomy    . Angioplasty     Family History  Problem Relation Age of Onset  . Other Mother     died pulmonary issues  . Coronary artery disease Father 35    died- CABG-HTN- AAA   Social History  Substance Use Topics  . Smoking status: Never Smoker   . Smokeless tobacco: Never Used  . Alcohol Use: Yes     Comment: occasional   OB History    No data available     Review of Systems  Constitutional: Negative for fever and chills.  HENT: Negative.   Eyes: Negative.   Respiratory: Negative for shortness of breath.   Cardiovascular: Positive for chest pain.  Gastrointestinal: Negative for nausea and vomiting.  Endocrine: Negative.   Genitourinary: Negative.   Musculoskeletal: Negative.   Skin: Negative.   Allergic/Immunologic: Negative.   Neurological: Positive for dizziness.  Hematological: Negative.   Psychiatric/Behavioral: Negative.       Allergies  Beef-derived products; Benazepril; Erythromycin; Indomethacin; Other; Biaxin; and Penicillins  Home Medications   Prior to Admission medications   Medication Sig Start Date End Date Taking? Authorizing Provider  albuterol (PROVENTIL HFA;VENTOLIN HFA) 108 (90 BASE) MCG/ACT inhaler Inhale 1-2 puffs into the lungs every 6 (six) hours as needed for wheezing or shortness of breath. 05/10/15  Yes Debby Freiberg, MD  amLODipine (NORVASC) 2.5 MG tablet Take 1 tablet (2.5 mg total) by mouth daily. 04/07/15  Yes Liliane Shi, PA-C  aspirin EC 81 MG tablet Take 81 mg by mouth daily.   Yes Historical Provider, MD  atorvastatin (LIPITOR) 40 MG tablet Take 1 tablet (40 mg total) by mouth daily. 05/05/15  Yes Carlena Bjornstad, MD  calcium carbonate  200 MG capsule Take 250 mg by mouth daily.     Yes Historical Provider, MD  clopidogrel (PLAVIX) 75 MG tablet Take 1 tablet (75 mg total) by mouth daily. 04/07/15  Yes Scott Joylene Draft, PA-C  DULoxetine (CYMBALTA) 30 MG capsule Take 90 mg by mouth daily.    Yes Historical Provider, MD  ergocalciferol (VITAMIN D2) 50000 UNITS capsule Take 50,000 Units by mouth once a week. Take on Saturday   Yes Historical Provider, MD  isosorbide mononitrate (IMDUR) 30 MG 24 hr tablet Take 0.5 tablets (15 mg total) by mouth daily. 04/07/15  Yes Scott Joylene Draft, PA-C  metoprolol succinate (TOPROL-XL) 25 MG 24 hr tablet Take 1 tablet (25 mg total) by mouth daily.  04/07/15  Yes Scott T Kathlen Mody, PA-C  naproxen (NAPROSYN) 500 MG tablet Take 1 tablet (500 mg total) by mouth 2 (two) times daily. Patient taking differently: Take 500 mg by mouth 2 (two) times daily as needed (pain).  05/19/14  Yes Jola Schmidt, MD  nitroGLYCERIN (NITROSTAT) 0.4 MG SL tablet Place 1 tablet (0.4 mg total) under the tongue every 5 (five) minutes as needed. For chest pain 04/07/15  Yes Liliane Shi, PA-C  ondansetron (ZOFRAN ODT) 8 MG disintegrating tablet Take 1 tablet (8 mg total) by mouth every 8 (eight) hours as needed for nausea or vomiting. 05/19/14  Yes Jola Schmidt, MD  pantoprazole (PROTONIX) 40 MG tablet TAKE 1 TABLET BY MOUTH DAILY. 09/06/15  Yes Carlena Bjornstad, MD  pregabalin (LYRICA) 75 MG capsule Take 225 mg by mouth 2 (two) times daily.    Yes Historical Provider, MD  ranolazine (RANEXA) 500 MG 12 hr tablet Take 1 tablet (500 mg total) by mouth 2 (two) times daily. 04/07/15 04/06/16 Yes Scott T Weaver, PA-C  traMADol (ULTRAM) 50 MG tablet Take 50 mg by mouth 3 (three) times daily as needed. For pain   Yes Historical Provider, MD  EPINEPHrine 0.3 mg/0.3 mL IJ SOAJ injection Inject 0.3 mLs (0.3 mg total) into the muscle once. 05/10/15   Debby Freiberg, MD   BP 119/76 mmHg  Pulse 69  Temp(Src) 98.6 F (37 C) (Oral)  Resp 10  Ht 5\' 3"  (1.6 m)  Wt 225 lb (102.059 kg)  BMI 39.87 kg/m2  SpO2 96% Physical Exam  Constitutional: She is oriented to person, place, and time. She appears well-developed and well-nourished. No distress.  HENT:  Head: Normocephalic and atraumatic.  Eyes: EOM are normal. Pupils are equal, round, and reactive to light.  Neck: Normal range of motion. Neck supple.  Cardiovascular: Normal rate, regular rhythm and normal heart sounds.   Pulmonary/Chest: Effort normal and breath sounds normal. No respiratory distress.  Abdominal: Soft. Bowel sounds are normal. She exhibits no distension. There is no tenderness.  Musculoskeletal: Normal range of motion. She  exhibits no edema.  Neurological: She is alert and oriented to person, place, and time. No cranial nerve deficit.  Skin: Skin is warm and dry. No erythema.  Psychiatric: She has a normal mood and affect. Her behavior is normal.  Nursing note and vitals reviewed.   ED Course  Procedures (including critical care time) Labs Review Labs Reviewed  BASIC METABOLIC PANEL - Abnormal; Notable for the following:    Glucose, Bld 130 (*)    All other components within normal limits  CBC  I-STAT TROPOININ, ED    Imaging Review Dg Chest 2 View  10/28/2015  CLINICAL DATA:  Chest pain and dizziness today.  Initial encounter. EXAM:  CHEST  2 VIEW COMPARISON:  PA and lateral chest 05/10/2015 and 04/27/2014. FINDINGS: The lungs are clear. Heart size is normal. No pneumothorax or pleural effusion. No focal bony abnormality. IMPRESSION: Negative chest. Electronically Signed   By: Inge Rise M.D.   On: 10/28/2015 14:04   I have personally reviewed and evaluated these images and lab results as part of my medical decision-making.   EKG Interpretation   Date/Time:  Friday October 28 2015 13:10:15 EST Ventricular Rate:  85 PR Interval:  168 QRS Duration: 92 QT Interval:  380 QTC Calculation: 452 R Axis:   -127 Text Interpretation:  Sinus rhythm with occasional Premature ventricular  complexes Right superior axis deviation Incomplete right bundle branch  block Possible Right ventricular hypertrophy Abnormal ECG No significant  change was found Confirmed by CAMPOS  MD, Lennette Bihari ([id number]) on 10/28/2015  2:50:33 PM      MDM   Final diagnoses:  Chest pain, unspecified chest pain type    Patient is a 64 year old female with history of CAD, DM, HTN, HLD presenting with substernal chest pain radiating to her chin and left arm. Physical exam unremarkable. Initial work up with negative CXR, troponin, and EKG, however story is concerning for cardiac etiology. Cardiology consulted. Patient will need  admission for possible unstable angina and ACS rule out.     Vivi Barrack, MD 10/28/15 [place], MD 10/28/15 [phone]

## 2015-10-28 NOTE — H&P (Signed)
Patient ID: Courtney Steele MRN: [id number], DOB/AGE: [birth year]   Admit date: 10/28/2015   Primary Physician: Courtney Casco, MD Primary Cardiologist: Dr. Ron Steele Now Following with Dr. Johnsie Steele   [id number] [name] is a 64 y.o. female with past medical history of CAD (s/p DES to LAD in 2012), HLD, pre-diabetes, and HTN who presents to [facility] ED for chest pain that started earlier today.  Patient reports she was driving to work around noon when she developed a tightness in her chest, which she rates as a 6/10. It started in her sternal region, but then radiated across her chest, into her neck, and down her left arm. 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 She took a SL NTG which did not provide much relief, granted she reports the tablets were 90-70.64 years old.  Since the initial onset, she reports the chest pain is "waxing and waning" in intensity, with the more intense episodes lasting 10-20 minutes. She denies any associated nausea, vomiting, or diaphoresis. She denies any anginal episodes prior to this current one. Reports good compliance with her medications.  While in the ED, her initial troponin has been negative. EKG shows NSR with known incomplete right bundle branch block.  She underwent cardiac catheterization with DES to proximal LAD in 06/2011. She had recurrent chest pain and had cath 07/14/11 which revealed patent stent in the LAD and 50% stenosis in the mid-LAD with medical therapy recommended for her treatment. She had recurrent chest pain and had a third cath on 08/30/2011 with pressure wire analysis which revealed continued patency of prox LAD stent and nonobstructive CAD and normal pressure wire analysis of LAD.   Last nuclear stress test was in 03/2013 which showed a small inferior scar with no evidence of ischemia.    Home Medications Prior to Admission medications   Medication Sig Start Date End Date Taking?  Authorizing Provider  albuterol (PROVENTIL HFA;VENTOLIN HFA) 108 (90 BASE) MCG/ACT inhaler Inhale 1-2 puffs into the lungs every 6 (six) hours as needed for wheezing or shortness of breath. 05/10/15  Yes Courtney Freiberg, MD  amLODipine (NORVASC) 2.5 MG tablet Take 1 tablet (2.5 mg total) by mouth daily. 04/07/15  Yes Courtney Shi, PA-C  aspirin EC 81 MG tablet Take 81 mg by mouth daily.   Yes Historical Provider, MD  atorvastatin (LIPITOR) 40 MG tablet Take 1 tablet (40 mg total) by mouth daily. 05/05/15  Yes Courtney Bjornstad, MD  calcium carbonate 200 MG capsule Take 250 mg by mouth daily.     Yes Historical Provider, MD  clopidogrel (PLAVIX) 75 MG tablet Take 1 tablet (75 mg total) by mouth daily. 04/07/15  Yes Courtney Joylene Draft, PA-C  DULoxetine (CYMBALTA) 30 MG capsule Take 90 mg by mouth daily.    Yes Historical Provider, MD  ergocalciferol (VITAMIN D2) 50000 UNITS capsule Take 50,000 Units by mouth once a week. Take on Saturday   Yes Historical Provider, MD  isosorbide mononitrate (IMDUR) 30 MG 24 hr tablet Take 0.5 tablets (15 mg total) by mouth daily. 04/07/15  Yes Courtney Joylene Draft, PA-C  metoprolol succinate (TOPROL-XL) 25 MG 24 hr tablet Take 1 tablet (25 mg total) by mouth daily. 04/07/15  Yes Courtney T Kathlen Mody, PA-C  naproxen (NAPROSYN) 500 MG tablet Take 1 tablet (500 mg total) by mouth 2 (two) times daily. Patient taking differently: Take 500 mg by mouth 2 (two) times daily as needed (  pain).  05/19/14  Yes Courtney Schmidt, MD  nitroGLYCERIN (NITROSTAT) 0.4 MG SL tablet Place 1 tablet (0.4 mg total) under the tongue every 5 (five) minutes as needed. For chest pain 04/07/15  Yes Courtney Shi, PA-C  ondansetron (ZOFRAN ODT) 8 MG disintegrating tablet Take 1 tablet (8 mg total) by mouth every 8 (eight) hours as needed for nausea or vomiting. 05/19/14  Yes Courtney Schmidt, MD  pantoprazole (PROTONIX) 40 MG tablet TAKE 1 TABLET BY MOUTH DAILY. 09/06/15  Yes Courtney Bjornstad, MD  pregabalin (LYRICA) 75 MG capsule Take  225 mg by mouth 2 (two) times daily.    Yes Historical Provider, MD  ranolazine (RANEXA) 500 MG 12 hr tablet Take 1 tablet (500 mg total) by mouth 2 (two) times daily. 04/07/15 04/06/16 Yes Courtney T Weaver, PA-C  traMADol (ULTRAM) 50 MG tablet Take 50 mg by mouth 3 (three) times daily as needed. For pain   Yes Historical Provider, MD  EPINEPHrine 0.3 mg/0.3 mL IJ SOAJ injection Inject 0.3 mLs (0.3 mg total) into the muscle once. 05/10/15   Courtney Freiberg, MD    Allergies Allergies  Allergen Reactions  . Beef-Derived Products   . Benazepril Cough  . Erythromycin Diarrhea  . Indomethacin Diarrhea  . Other     No mammal protein substances  . Biaxin [Clarithromycin] Rash  . Penicillins Rash    Past Medical History Past Medical History  Diagnosis Date  . CAD (coronary artery disease)     a. 2012: DES to LAD   . Hypertension   . Hyperlipidemia   . Borderline diabetes   . Morbid obesity ([place])   . Fibromyalgia   . Osteoarthritis   . Degenerative joint disease   . Groin hematoma   . Hematoma     Radial catheter site July 13, 2011  . Diabetes mellitus      Surgical History Past Surgical History  Procedure Laterality Date  . Cardiac catheterization      revealing patency of the LAD stent with mild compromise of  the diagonal and moderate mid LAD stenosis, but does not appear to  be high grade.   . Cholecystectomy    . Tonsillectomy    . Carpal tunnel release    . Bunionectomy    . Knee arthroscopy    . Abdominal hysterectomy    . Angioplasty       Family History Family History  Problem Relation Age of Onset  . Other Mother     died pulmonary issues  . Coronary artery disease Father 64    died- CABG-HTN- AAA    Social History Social History   Social History  . Marital Status: Married    Spouse Name: N/A  . Number of Children: 2  . Years of Education: N/A   Occupational History  . [employer]   Social History Main Topics  . Smoking status: Never  Smoker   . Smokeless tobacco: Never Used  . Alcohol Use: 0.0 oz/week    0 Standard drinks or equivalent per week     Comment: Occasional - few drinks a month  . Drug Use: No  . Sexual Activity: Not on file   Other Topics Concern  . Not on file   Social History Narrative     Review of Systems: General:  No chills, fever, night sweats or weight changes.  Cardiovascular:  No edema, orthopnea, palpitations, paroxysmal nocturnal dyspnea. Positive for chest pain and dyspnea on exertion. Dermatological:  No rash, lesions/masses Respiratory: No cough, Positive for dyspnea Urologic: No hematuria, dysuria Abdominal:   No nausea, vomiting, diarrhea, bright red blood per rectum, melena, or hematemesis Neurologic:  No visual changes, wkns, changes in mental status. All other systems reviewed and are otherwise negative except as noted above.   Physical Exam: Blood pressure 119/76, pulse 69, temperature 98.6 F (37 C), temperature source Oral, resp. rate 10, height 5\' 3"  (1.6 m), weight 225 lb (102.059 kg), SpO2 96 %.  General: Well developed, well nourished,female in no acute distress. Head: Normocephalic, atraumatic, sclera non-icteric, no xanthomas, nares are without discharge. Dentition:  Neck: No carotid bruits. JVD not elevated.  Lungs: Respirations regular and unlabored, without wheezes or rales.  Heart: Regular rate and rhythm. No S3 or S4.  No murmur, no rubs, or gallops appreciated. Abdomen: Soft, non-tender, non-distended with normoactive bowel sounds. No hepatomegaly. No rebound/guarding. No obvious abdominal masses. Msk:  Strength and tone appear normal for age. No joint deformities or effusions. Extremities: No clubbing or cyanosis. No edema.  Distal pedal pulses are 2+ bilaterally. Neuro: Alert and oriented X 3. Moves all extremities spontaneously. No focal deficits noted. Psych:  Responds to questions appropriately with a normal affect. Skin: No rashes or lesions  noted   Labs: Lab Results  Component Value Date   WBC 5.0 10/28/2015   HGB 12.8 10/28/2015   HCT 37.9 10/28/2015   MCV 91.3 10/28/2015   PLT 196 10/28/2015     Recent Labs Lab 10/28/15 1313  NA 142  K 3.7  CL 105  CO2 26  BUN 13  CREATININE 0.73  CALCIUM 9.0  GLUCOSE 130*   Troponin (Point of Care Test)  Recent Labs  10/28/15 1335  TROPIPOC 0.00    ECG: NSR w/ rate in 80's. PVC's. Known incomplete RBBB.  Radiology/Studies: Dg Chest 2 View: 10/28/2015  CLINICAL DATA:  Chest pain and dizziness today.  Initial encounter. EXAM: CHEST  2 VIEW COMPARISON:  PA and lateral chest 05/10/2015 and 04/27/2014. FINDINGS: The lungs are clear. Heart size is normal. No pneumothorax or pleural effusion. No focal bony abnormality. IMPRESSION: Negative chest. Electronically Signed   By: Inge Rise M.D.   On: 10/28/2015 14:04   Cardiac Catherization: 08/2011 PROCEDURAL FINDINGS: Angiography demonstrates stable appearance of the patient's coronary anatomy. Her left main and left circumflex are widely patent. The LAD has a widely patent stent and beyond the stent in the mid LAD there was 40-50% irregular stenosis, but further down in the mid and distal LAD there was no significant stenosis noted. The first diagonal branch originating from the stent is a small vessel that has 50-70% ostial stenosis.  As above, pressure wire analysis of the LAD yielded an FFR of 0.92 at peak hyperemia.  Cardiac Catherization: 07/2011 FINAL ASSESSMENT: Continued patency of the proximal left anterior descending stent with nonobstructive coronary artery disease and normal pressure wire analysis of the left anterior descending.  ANGIOGRAPHIC DATA: 1. The left main coronary artery is free of critical disease. 2. The left anterior descending artery has been previously stented.  The stent overlies the origin of the diagonal. The stent is widely  patent with less than 10% narrowing. The  diagonal itself has some  narrowing, probably approaching 50% of the ostium, but looks  actually somewhat better than during the interventional films.  There is a clear-cut mid-LAD stenosis that represents about 50-60%  luminal reduction. It does not appear to be high-grade and is  rather smooth and  segmental. Just after this, vessel opens back up  and provides the mid and distal portion the left anterior  descending artery down to the apex. 3. The circumflex is a large-caliber vessel providing a marginal  branch which trifurcates distally and is free of critical disease. 4. The right coronary artery is a fairly large-caliber vessel that  provides posterior descending and posterolateral branch and is free  of critical disease.  CONCLUSION: Continued patency of the left anterior descending stent with mild compromise of the diagonal and a moderate mid LAD stenosis but does not appear to be high-grade.  ASSESSMENT AND PLAN:   1. Unstable angina (HCC) - history of CAD s/p DES to LAD in 2012. Had known 50% stenosis in the mid-LAD back in 2012. - last NST was in 03/2013 which showed inferior scar but no evidence of ischemia. - developed chest tightness with radiating pain into her neck and down her left arm earlier today. Having associated dyspnea with exertion. - initial troponin has been negative. Will cycle cardiac enzymes. - EKG shows NSR with known incomplete RBBB. - continue ASA, statin, Plavix, BB, and Ranexa. Will place on NTG drip. Will hold off on starting Heparin at this time unless her cardiac enzymes become positive. - plan for LHC on Monday.  2. Hypertension - BP has been 119/76 - 146/96 while in the ED. - continue current regimen.  3. Hyperlipidemia - will obtain lipid panel - continue statin  4. Prediabetes - not on any medications prior to admission. Had A1c drawn yesterday but does not yet know results. - will continue to monitor.  If glucose remains elevated, would start SSI.  Signed, Erma Heritage, PA-C 10/28/2015, 4:05 PM Pager: [phone]  I have personally seen and examined this patient with Bernerd Pho, PA-C. I agree with the assessment and plan as outlined above. Courtney Steele has known CAD with last PCI in 2012 at which time a DES was placed in the proximal LAD. Repeat cath later in 2012 x 2 with FFR of LAD showing non-flow limiting 50% stenosis of mid LAD beyond the stent and no other focally obstructive lesions. She has done well with medical management for the last 4 years. Recurrence of chest pain today with associated SOB and radiation to her shoulder and arm. Her presentation is worrisome for unstable angina. Troponin negative x 1. EKG without ischemic changes. Will admit to telemetry unit, cycle cardiac markers, start NTG IV. Will not start heparin unless troponin becomes positive. I do not think a stress test would be helpful given her moderate LAD disease by cath 4 years ago. Plan cath Monday morning with possible PCI.    MCALHANY,CHRISTOPHER 10/28/2015 4:34 PM

## 2015-10-28 NOTE — ED Notes (Signed)
Pt with extensive cardiac hx to ED with sternal chest pain that radiates to chin and L arm.  Pt took 1 nitro with no relief of pain, but that increased the dizziness.

## 2015-10-28 NOTE — ED Notes (Signed)
Attempted report 

## 2015-10-28 NOTE — Progress Notes (Signed)
Attempted report 

## 2015-10-29 ENCOUNTER — Encounter (HOSPITAL_COMMUNITY): Payer: Self-pay | Admitting: *Deleted

## 2015-10-29 LAB — CBC
HCT: 36.2 % (ref 36.0–46.0)
Hemoglobin: 12.2 g/dL (ref 12.0–15.0)
MCH: 30.9 pg (ref 26.0–34.0)
MCHC: 33.7 g/dL (ref 30.0–36.0)
MCV: 91.6 fL (ref 78.0–100.0)
PLATELETS: 175 10*3/uL (ref 150–400)
RBC: 3.95 MIL/uL (ref 3.87–5.11)
RDW: 14.6 % (ref 11.5–15.5)
WBC: 6.1 10*3/uL (ref 4.0–10.5)

## 2015-10-29 LAB — LIPID PANEL
CHOL/HDL RATIO: 3.6 ratio
CHOLESTEROL: 142 mg/dL (ref 0–200)
HDL: 40 mg/dL — AB (ref >40)
LDL Cholesterol: 61 mg/dL (ref 0–99)
TRIGLYCERIDES: 207 mg/dL — AB (ref <150)
VLDL: 41 mg/dL — ABNORMAL HIGH (ref 0–40)

## 2015-10-29 LAB — GLUCOSE, CAPILLARY
GLUCOSE-CAPILLARY: 142 mg/dL — AB (ref 65–99)
GLUCOSE-CAPILLARY: 115 mg/dL — AB (ref 65–99)
GLUCOSE-CAPILLARY: 130 mg/dL — AB (ref 65–99)

## 2015-10-29 LAB — BASIC METABOLIC PANEL
Anion gap: 5 (ref 5–15)
BUN: 16 mg/dL (ref 6–20)
CALCIUM: 8.8 mg/dL — AB (ref 8.9–10.3)
CHLORIDE: 106 mmol/L (ref 101–111)
CO2: 32 mmol/L (ref 22–32)
CREATININE: 0.8 mg/dL (ref 0.44–1.00)
Glucose, Bld: 103 mg/dL — ABNORMAL HIGH (ref 65–99)
Potassium: 3.7 mmol/L (ref 3.5–5.1)
SODIUM: 143 mmol/L (ref 135–145)

## 2015-10-29 LAB — PROTIME-INR
INR: 1.1 (ref 0.00–1.49)
Prothrombin Time: 14.4 seconds (ref 11.6–15.2)

## 2015-10-29 LAB — TROPONIN I

## 2015-10-29 MED ORDER — CYCLOBENZAPRINE HCL 10 MG PO TABS: 5.0000 mg | ORAL_TABLET | Freq: Three times a day (TID) | ORAL | Status: DC | PRN

## 2015-10-29 NOTE — Progress Notes (Signed)
Utilization Review Completed.  

## 2015-10-29 NOTE — Progress Notes (Addendum)
Patient complained of dizziness with movement (from lying to sitting).  IV nitro decreased to 10 mcg/min.

## 2015-10-29 NOTE — Progress Notes (Addendum)
Patient had episode of chest pain.  SBP=110.  IV nitro increased to 79mcg/min and 4L O2 [place] applied.  Upon reassessment.  Patient stated her pain was gone.  BP=118/51.  O2 backed down to 2L [place].  Rosaria Ferries PA notified.

## 2015-10-29 NOTE — Progress Notes (Addendum)
Subjective:  No complaints of chest tightness or pressure at the present time.  Not on  IV heparin.  Since enzymes have been negative and EKG unremarkable.  Not short of breath.  Objective:  Vital Signs in the last 24 hours: BP 107/51 mmHg  Pulse 60  Temp(Src) 98.1 F (36.7 C) (Oral)  Resp 18  Ht 5\' 3"  (1.6 m)  Wt 100.018 kg (220 lb 8 oz)  BMI 39.07 kg/m2  SpO2 97%  Physical Exam: Obese white female currently in no acute distress Lungs:  Clear Cardiac:  Regular rhythm, normal S1 and S2, no S3 Extremities:  No edema present  Intake/Output from previous day: 11/18 0701 - 11/19 0700 In: 267.4 [P.O.:240; I.V.:27.4] Out: 800 [Urine:800]  Weight Filed Weights   10/28/15 1310 10/28/15 1831 10/29/15 0455  Weight: 102.059 kg (225 lb) 99.746 kg (219 lb 14.4 oz) 100.018 kg (220 lb 8 oz)    Lab Results: Basic Metabolic Panel:  Recent Labs  10/28/15 1313 10/29/15 0347  NA 142 143  K 3.7 3.7  CL 105 106  CO2 26 32  GLUCOSE 130* 103*  BUN 13 16  CREATININE 0.73 0.80   CBC:  Recent Labs  10/28/15 1313 10/29/15 0347  WBC 5.0 6.1  HGB 12.8 12.2  HCT 37.9 36.2  MCV 91.3 91.6  PLT 196 175   Cardiac Enzymes: Troponin (Point of Care Test)  Recent Labs  10/28/15 1335  TROPIPOC 0.00   Cardiac Panel (last 3 results)  Recent Labs  10/28/15 1645 10/28/15 2227 10/29/15 0347  TROPONINI <0.03 <0.03 <0.03    Telemetry: Sinus rhythm  Assessment/Plan:  1.  Unstable angina pectoris with negative cardiac enzymes 2.  Hypertension controlled 3.  Fibromyalgia 4.  Hyperlipidemia  Recommendations:  She has plans for cardiac catheterization on Monday.  She states that her fibromyalgia can sometimes become uncontrolled when she is under stress and will have orders for pain meds to take if needed.  Catheterization planned on Monday.  May discontinue oxygen if needed.      Kerry Hough  MD [facility]  10/29/2015, 8:39 AM

## 2015-10-30 LAB — GLUCOSE, CAPILLARY
GLUCOSE-CAPILLARY: 139 mg/dL — AB (ref 65–99)
Glucose-Capillary: 100 mg/dL — ABNORMAL HIGH (ref 65–99)
Glucose-Capillary: 97 mg/dL (ref 65–99)

## 2015-10-30 MED ORDER — DOCUSATE SODIUM 100 MG PO CAPS
100.0000 mg | ORAL_CAPSULE | Freq: Two times a day (BID) | ORAL | Status: DC
  Administered 2015-10-30 – 2015-10-31 (×3): 100 mg via ORAL
  Filled 2015-10-30 (×3): qty 1

## 2015-10-30 NOTE — Progress Notes (Signed)
Chest pain, 4 out of 10.  BP=120/61.  Iv nitro increased to 15 mcg/min. O2 4L [place] applied.

## 2015-10-30 NOTE — Progress Notes (Signed)
Subjective:  Continues to have some vague chest discomfort that waxes and wanes.  Troponins have all been negative.  Mild dizziness according to nurse.  Blood pressure is soft.  Objective:  Vital Signs in the last 24 hours: BP 103/54 mmHg  Pulse 71  Temp(Src) 98.2 F (36.8 C) (Oral)  Resp 14  Ht 5\' 3"  (1.6 m)  Wt 99.338 kg (219 lb)  BMI 38.80 kg/m2  SpO2 96%  Physical Exam: Obese white female currently in no acute distress Lungs:  Clear Cardiac:  Regular rhythm, normal S1 and S2, no S3 Extremities:  No edema present  Intake/Output from previous day: 11/19 0701 - 11/20 0700 In: [id number] [P.O.:1380; I.V.:24] Out: 3425 [Urine:3425]  Weight Filed Weights   10/28/15 1831 10/29/15 0455 10/30/15 0400  Weight: 99.746 kg (219 lb 14.4 oz) 100.018 kg (220 lb 8 oz) 99.338 kg (219 lb)    Lab Results: Basic Metabolic Panel:  Recent Labs  10/28/15 1313 10/29/15 0347  NA 142 143  K 3.7 3.7  CL 105 106  CO2 26 32  GLUCOSE 130* 103*  BUN 13 16  CREATININE 0.73 0.80   CBC:  Recent Labs  10/28/15 1313 10/29/15 0347  WBC 5.0 6.1  HGB 12.8 12.2  HCT 37.9 36.2  MCV 91.3 91.6  PLT 196 175   Cardiac Enzymes: Troponin (Point of Care Test)  Recent Labs  10/28/15 1335  TROPIPOC 0.00   Cardiac Panel (last 3 results)  Recent Labs  10/28/15 1645 10/28/15 2227 10/29/15 0347  TROPONINI <0.03 <0.03 <0.03    Telemetry: Sinus rhythm  EKG: Sinus rhythm with incomplete right bundle branch block and left axis deviation  Assessment/Plan:  1.  Unstable angina pectoris with negative cardiac enzymes 2.  Hypertension controlled 3.  Fibromyalgia 4.  Hyperlipidemia  Recommendations:  Catheterization planned on Monday. Cardiac catheterization was discussed with the patient fully including risks of myocardial infarction, death, stroke, bleeding, arrhythmia, dye allergy, renal insufficiency or bleeding.  The patient understands and is willing to proceed.  Possibility of  intervention discussed with patient at the same setting.  Enzymes have been negative and we'll hold off on heparin in the absence of EKG changes right now.      Kerry Hough  MD [facility] Cardiology  10/30/2015, 8:30 AM

## 2015-10-31 ENCOUNTER — Encounter (HOSPITAL_COMMUNITY): Payer: Self-pay | Admitting: Cardiology

## 2015-10-31 ENCOUNTER — Encounter (HOSPITAL_COMMUNITY)
Admission: EM | Disposition: A | Discharge status: Home / Self Care | Payer: 59 | Source: Home / Self Care | Attending: Cardiovascular Disease

## 2015-10-31 DIAGNOSIS — R079 Chest pain, unspecified: Secondary | ICD-10-CM | POA: Insufficient documentation

## 2015-10-31 DIAGNOSIS — E785 Hyperlipidemia, unspecified: Secondary | ICD-10-CM

## 2015-10-31 DIAGNOSIS — I2510 Atherosclerotic heart disease of native coronary artery without angina pectoris: Secondary | ICD-10-CM

## 2015-10-31 DIAGNOSIS — I10 Essential (primary) hypertension: Secondary | ICD-10-CM

## 2015-10-31 HISTORY — PX: CARDIAC CATHETERIZATION: SHX172

## 2015-10-31 LAB — GLUCOSE, CAPILLARY
GLUCOSE-CAPILLARY: 97 mg/dL (ref 65–99)
GLUCOSE-CAPILLARY: 98 mg/dL (ref 65–99)
GLUCOSE-CAPILLARY: 62 mg/dL — AB (ref 65–99)
Glucose-Capillary: 95 mg/dL (ref 65–99)
Glucose-Capillary: 65 mg/dL (ref 65–99)
Glucose-Capillary: 101 mg/dL — ABNORMAL HIGH (ref 65–99)

## 2015-10-31 SURGERY — LEFT HEART CATH AND CORONARY ANGIOGRAPHY

## 2015-10-31 MED ORDER — FENTANYL CITRATE (PF) 100 MCG/2ML IJ SOLN
INTRAMUSCULAR | Status: AC
  Filled 2015-10-31: qty 2

## 2015-10-31 MED ORDER — ASPIRIN EC 81 MG PO TBEC: 81.0000 mg | DELAYED_RELEASE_TABLET | Freq: Every day | ORAL | Status: DC

## 2015-10-31 MED ORDER — HEPARIN (PORCINE) IN NACL 2-0.9 UNIT/ML-% IJ SOLN
INTRAMUSCULAR | Status: AC
Start: 2015-10-31 — End: 2015-10-31
  Filled 2015-10-31: qty 1500

## 2015-10-31 MED ORDER — MIDAZOLAM HCL 2 MG/2ML IJ SOLN
INTRAMUSCULAR | Status: AC
  Filled 2015-10-31: qty 2

## 2015-10-31 MED ORDER — FENTANYL CITRATE (PF) 100 MCG/2ML IJ SOLN
INTRAMUSCULAR | Status: DC | PRN
  Administered 2015-10-31 (×2): 25 ug via INTRAVENOUS

## 2015-10-31 MED ORDER — HEPARIN SODIUM (PORCINE) 1000 UNIT/ML IJ SOLN
INTRAMUSCULAR | Status: DC | PRN
  Administered 2015-10-31: 5000 [IU] via INTRAVENOUS

## 2015-10-31 MED ORDER — SODIUM CHLORIDE 0.9 % IJ SOLN: 3.0000 mL | INTRAMUSCULAR | Status: DC | PRN

## 2015-10-31 MED ORDER — VERAPAMIL HCL 2.5 MG/ML IV SOLN
INTRAVENOUS | Status: AC
Start: 2015-10-31 — End: 2015-10-31
  Filled 2015-10-31: qty 2

## 2015-10-31 MED ORDER — SODIUM CHLORIDE 0.9 % IV SOLN: 250.0000 mL | INTRAVENOUS | Status: DC | PRN

## 2015-10-31 MED ORDER — LIDOCAINE HCL (PF) 1 % IJ SOLN
INTRAMUSCULAR | Status: AC
  Filled 2015-10-31: qty 30

## 2015-10-31 MED ORDER — HEPARIN SODIUM (PORCINE) 1000 UNIT/ML IJ SOLN
INTRAMUSCULAR | Status: AC
  Filled 2015-10-31: qty 1

## 2015-10-31 MED ORDER — LIDOCAINE HCL (PF) 1 % IJ SOLN
INTRAMUSCULAR | Status: DC | PRN
  Administered 2015-10-31: 15:00:00

## 2015-10-31 MED ORDER — SODIUM CHLORIDE 0.9 % IJ SOLN
3.0000 mL | Freq: Two times a day (BID) | INTRAMUSCULAR | Status: DC
  Administered 2015-10-31: 3 mL via INTRAVENOUS

## 2015-10-31 MED ORDER — MIDAZOLAM HCL 2 MG/2ML IJ SOLN
INTRAMUSCULAR | Status: DC | PRN
  Administered 2015-10-31: 1 mg via INTRAVENOUS
  Administered 2015-10-31: 2 mg via INTRAVENOUS

## 2015-10-31 MED ORDER — SODIUM CHLORIDE 0.9 % WEIGHT BASED INFUSION
3.0000 mL/kg/h | INTRAVENOUS | Status: AC
  Administered 2015-10-31 (×2): 3 mL/kg/h via INTRAVENOUS

## 2015-10-31 MED ORDER — RANOLAZINE ER 500 MG PO TB12: 1000.0000 mg | ORAL_TABLET | Freq: Two times a day (BID) | ORAL | Status: DC

## 2015-10-31 MED ORDER — SODIUM CHLORIDE 0.9 % WEIGHT BASED INFUSION
3.0000 mL/kg/h | INTRAVENOUS | Status: DC
  Administered 2015-10-31: 3 mL/kg/h via INTRAVENOUS

## 2015-10-31 MED ORDER — VERAPAMIL HCL 2.5 MG/ML IV SOLN
INTRAVENOUS | Status: DC | PRN
  Administered 2015-10-31: 14:00:00 via INTRA_ARTERIAL

## 2015-10-31 MED ORDER — RANOLAZINE ER 1000 MG PO TB12: 1000.0000 mg | ORAL_TABLET | Freq: Two times a day (BID) | ORAL | Status: DC

## 2015-10-31 MED ORDER — ASPIRIN 81 MG PO CHEW
81.0000 mg | CHEWABLE_TABLET | ORAL | Status: AC
  Administered 2015-10-31: 81 mg via ORAL
  Filled 2015-10-31: qty 1

## 2015-10-31 MED ORDER — SODIUM CHLORIDE 0.9 % WEIGHT BASED INFUSION
1.0000 mL/kg/h | INTRAVENOUS | Status: DC
  Administered 2015-10-31: 1 mL/kg/h via INTRAVENOUS

## 2015-10-31 SURGICAL SUPPLY — 13 items
CATH INFINITI 5 FR JL3.5 (CATHETERS) ×3 IMPLANT
CATH INFINITI 5FR ANG PIGTAIL (CATHETERS) ×3 IMPLANT
CATH INFINITI JR4 5F (CATHETERS) ×3 IMPLANT
CATH LAUNCHER 5F RADR (CATHETERS) ×1 IMPLANT
CATHETER LAUNCHER 5F RADR (CATHETERS) ×3
DEVICE RAD COMP TR BAND LRG (VASCULAR PRODUCTS) ×3 IMPLANT
GLIDESHEATH SLEND SS 6F .021 (SHEATH) ×3 IMPLANT
KIT HEART LEFT (KITS) ×3 IMPLANT
PACK CARDIAC CATHETERIZATION (CUSTOM PROCEDURE TRAY) ×3 IMPLANT
SYR MEDRAD MARK V 150ML (SYRINGE) ×3 IMPLANT
TRANSDUCER W/STOPCOCK (MISCELLANEOUS) ×3 IMPLANT
TUBING CIL FLEX 10 FLL-RA (TUBING) ×3 IMPLANT
WIRE SAFE-T 1.5MM-J .035X260CM (WIRE) ×3 IMPLANT

## 2015-10-31 NOTE — Progress Notes (Signed)
Patient Profile: 64 y.o. female with past medical history of CAD (s/p DES to LAD in 2012), HLD, pre-diabetes, and HTN admitted 10/28/15 for unstable angina. Cardiac enzymes negative x 3.    Subjective: Currently CP free. No recurrent symptoms overnight.   Objective: Vital signs in last 24 hours: Temp:  [97.8 F (36.6 C)-98.8 F (37.1 C)] 98 F (36.7 C) (11/21 0500) Pulse Rate:  [72-81] 78 (11/21 0500) Resp:  [14-18] 14 (11/21 0500) BP: (105-116)/(50-78) 114/64 mmHg (11/21 0500) SpO2:  [95 %-98 %] 96 % (11/21 0500) Weight:  [220 lb 12.8 oz (100.154 kg)] 220 lb 12.8 oz (100.154 kg) (11/21 0500) Last BM Date: 10/29/15  Intake/Output from previous day: 11/20 0701 - 11/21 0700 In: 1735.8 [P.O.:1140; I.V.:595.8] Out: 2400 [Urine:2400] Intake/Output this shift:    Medications Current Facility-Administered Medications  Medication Dose Route Frequency Provider Last Rate Last Dose  . 0.9 %  sodium chloride infusion  250 mL Intravenous PRN Peter M Martinique, MD      . 0.9% sodium chloride infusion  1 mL/kg/hr Intravenous Continuous Peter M Martinique, MD 99.3 mL/hr at 10/31/15 0515 1 mL/kg/hr at 10/31/15 0515  . acetaminophen (TYLENOL) tablet 650 mg  650 mg Oral Q4H PRN Erma Heritage, [place]   650 mg at 10/31/15 0014  . albuterol (PROVENTIL) (2.5 MG/3ML) 0.083% nebulizer solution 3 mL  3 mL Inhalation Q6H PRN Erma Heritage, PA      . amLODipine (NORVASC) tablet 2.5 mg  2.5 mg Oral Daily Erma Heritage, [place]   2.5 mg at 10/30/15 0917  . [START ON 11/01/2015] aspirin EC tablet 81 mg  81 mg Oral Daily Burnell Blanks, MD      . atorvastatin (LIPITOR) tablet 40 mg  40 mg Oral QPM Erma Heritage, PA   40 mg at 10/30/15 1719  . clopidogrel (PLAVIX) tablet 75 mg  75 mg Oral Daily Erma Heritage, [place]   75 mg at 10/30/15 [id number]  . cyclobenzaprine (FLEXERIL) tablet 5 mg  5 mg Oral TID PRN Jacolyn Reedy, MD      . docusate sodium (COLACE) capsule 100 mg  100 mg Oral BID Evelene Croon  Barrett, PA-C   100 mg at 10/30/15 2233  . DULoxetine (CYMBALTA) DR capsule 90 mg  90 mg Oral Daily Erma Heritage, [place]   90 mg at 10/30/15 [id number]  . enoxaparin (LOVENOX) injection 40 mg  40 mg Subcutaneous Q24H Fransisco Hertz [place], [place]   40 mg at 10/30/15 2020  . metoprolol succinate (TOPROL-XL) 24 hr tablet 25 mg  25 mg Oral QPM Erma Heritage, [place]   25 mg at 10/30/15 1720  . nitroGLYCERIN (NITROSTAT) SL tablet 0.4 mg  0.4 mg Sublingual Q5 min PRN Erma Heritage, [place]      . nitroGLYCERIN 50 mg in dextrose 5 % 250 mL (0.2 mg/mL) infusion  3-30 mcg/min Intravenous Titrated Erma Heritage, PA 4.5 mL/hr at 10/31/15 0015 15 mcg/min at 10/31/15 0015  . ondansetron (ZOFRAN) injection 4 mg  4 mg Intravenous Q6H PRN Erma Heritage, PA      . ondansetron (ZOFRAN-ODT) disintegrating tablet 8 mg  8 mg Oral Q8H PRN Erma Heritage, [place]      . pantoprazole (PROTONIX) EC tablet 40 mg  40 mg Oral Daily Erma Heritage, [place]   40 mg at 10/30/15 [id number]  . pregabalin (LYRICA) capsule 225 mg  225 mg Oral BID Erma Heritage, [place]  225 mg at 10/30/15 2231  . ranolazine (RANEXA) 12 hr tablet 500 mg  500 mg Oral BID Erma Heritage, [place]   500 mg at 10/30/15 2233  . sodium chloride 0.9 % injection 3 mL  3 mL Intravenous Q12H Peter M Martinique, MD      . sodium chloride 0.9 % injection 3 mL  3 mL Intravenous PRN Peter M Martinique, MD      . traMADol Veatrice Bourbon) tablet 50 mg  50 mg Oral Q12H PRN Erma Heritage, PA        PE: General appearance: alert, cooperative and no distress Neck: no carotid bruit and no JVD Lungs: decreased BS at the bases Heart: regular rate and rhythm, S1, S2 normal, no murmur, click, rub or gallop Extremities: no LEE Pulses: 2+ and symmetric Skin: warm and dry Neurologic: Grossly normal  Lab Results:   Recent Labs  10/28/15 1313 10/29/15 0347  WBC 5.0 6.1  HGB 12.8 12.2  HCT 37.9 36.2  PLT 196 175   BMET  Recent Labs  10/28/15 1313 10/29/15 0347  NA 142 143    K 3.7 3.7  CL 105 106  CO2 26 32  GLUCOSE 130* 103*  BUN 13 16  CREATININE 0.73 0.80  CALCIUM 9.0 8.8*   PT/INR  Recent Labs  10/29/15 0347  LABPROT 14.4  INR 1.10   Cholesterol  Recent Labs  10/29/15 0347  CHOL 142   Cardiac Panel (last 3 results)  Recent Labs  10/28/15 1645 10/28/15 2227 10/29/15 0347  TROPONINI <0.03 <0.03 <0.03     Assessment/Plan  Principal Problem:   Unstable angina (HCC) Active Problems:   CAD (coronary artery disease)   Hypertension   Hyperlipidemia   Prediabetes   1. Unstable Angina: cardiac enzymes negative x 3. Currently CP free. Plan for [facility] today +/- PCI.   2. CAD: s/p DES to LAD in 2012, now with recurrent CP concerning for unstable angina. Plan for LHC today to redefine coronary anatomy and assess patency of LAD stent.   3. HTN: BP is well controlled on current regimen.  4. HLD: LDL at goal at 61. Continue statin.   5: Prediabetes: FPG ok. Continue routine surveillance per PCP.    LOS: 3 days    Brittainy M. Rosita Fire, PA-C 10/31/2015 7:40 AM

## 2015-10-31 NOTE — Interval H&P Note (Signed)
History and Physical Interval Note:  10/31/2015 2:09 PM  Courtney Steele  has presented today for surgery, with the diagnosis of unstable angina  The various methods of treatment have been discussed with the patient and family. After consideration of risks, benefits and other options for treatment, the patient has consented to  Procedure(s): Left Heart Cath and Coronary Angiography (N/A) as a surgical intervention .  The patient's history has been reviewed, patient examined, no change in status, stable for surgery.  I have reviewed the patient's chart and labs.  Questions were answered to the patient's satisfaction.   Cath Lab Visit (complete for each Cath Lab visit)  Clinical Evaluation Leading to the Procedure:   ACS: Yes.    Non-ACS:    Anginal Classification: CCS III  Anti-ischemic medical therapy: Maximal Therapy (2 or more classes of medications)  Non-Invasive Test Results: No non-invasive testing performed  Prior CABG: No previous CABG        Courtney Steele [facility] 10/31/2015 2:09 PM

## 2015-10-31 NOTE — Progress Notes (Signed)
Physician Discharge Summary  Patient ID: Courtney Steele MRN: [id number] DOB/AGE: February 01, 1951 64 y.o.   Primary Cardiologist: Previously Dr. Ron Parker Requesting Dr. Martinique  Admit date: 10/28/2015 Discharge date: 10/31/2015  Admission Diagnoses: Chest Pain with Moderate Risk for Cardiac Etiology  Discharge Diagnoses:  Principal Problem:   Unstable angina [facility]) Active Problems:   CAD (coronary artery disease)   Hypertension   Hyperlipidemia   Prediabetes   Pain in the chest   Discharged Condition: stable  Hospital Course:  64 y.o. female with past medical history of CAD (s/p DES to LAD in 2012), HLD, pre-diabetes and HTN. She underwent cardiac catheterization with DES to proximal LAD in 06/2011. She had recurrent chest pain and had cath 07/14/11 which revealed patent stent in the LAD and 50% stenosis in the mid-LAD with medical therapy recommended for her treatment. She had recurrent chest pain and had a third cath on 08/30/2011 with pressure wire analysis which revealed continued patency of prox LAD stent and nonobstructive CAD and normal pressure wire analysis of LAD. Last nuclear stress test was in 03/2013 which showed a small inferior scar with no evidence of ischemia.  She presented to [facility] on 10/28/15 with complaint of chest pain, described and resting chest tightness radiating across her chest, into her neck and down her left arm, "waxing and waning" in intensity. While in the ED, her initial troponin was negative. EKG showed NSR with known incomplete right bundle branch block. Given her history and nature of her symptoms, she was admitted to telemetry. Cardiac enzymes were cycled and remained negative x 3. She had relief of pain with IV NTG. On 10/31/15, she underwent a cardiac cath by Dr. Martinique for definitive assessment. This showed nonobstructive CAD. The stent in the proximal LAD was widely patent. The proximal to mid RCA had 10% stenosis. LV systolic function was normal at 55-65%.  Continued medical therapy was recommended. Her Ranexa was increased to 1000 mg BID. She left the cath lab in stable condition. She was monitored post cath and had no post cath complications. She was last seen and examined by Dr. Oval Linsey who determined she was stable for discharge home. Post hospital f/u has been scheduled with Rosaria Ferries, PA-C on 11/08/15. She was given a work excuse to return to work on 11/04/15.    Consults: None  Significant Diagnostic Studies:  Cardiac Panel (last 3 results)  Recent Labs  10/28/15 1645 10/28/15 2227 10/29/15 0347  TROPONINI <0.03 <0.03 <0.03   LHC 10/31/15 Conclusion     Prox RCA to Mid RCA lesion, 10% stenosed.  Mid LAD lesion, 30% stenosed.  The left ventricular systolic function is normal.  1. Nonobstructive CAD. The stent in the proximal LAD is widely patent.  2. Normal LV function.  Plan: continue medical therapy. Consider alternative causes for chest pain.     Treatments: See Hospital Course  Discharge Exam: Blood pressure 120/66, pulse 66, temperature 98.6 F (37 C), temperature source Oral, resp. rate 14, height 5\' 3"  (1.6 m), weight 220 lb 12.8 oz (100.154 kg), SpO2 98 %.   Disposition: 01-Home or Self Care      Discharge Instructions    Diet - low sodium heart healthy    Complete by:  As directed      Increase activity slowly    Complete by:  As directed             Medication List    TAKE these medications  albuterol 108 (90 BASE) MCG/ACT inhaler  Commonly known as:  PROVENTIL HFA;VENTOLIN HFA  Inhale 1-2 puffs into the lungs every 6 (six) hours as needed for wheezing or shortness of breath.     amLODipine 2.5 MG tablet  Commonly known as:  NORVASC  Take 1 tablet (2.5 mg total) by mouth daily.     aspirin EC 81 MG tablet  Take 81 mg by mouth daily.     atorvastatin 40 MG tablet  Commonly known as:  LIPITOR  Take 1 tablet (40 mg total) by mouth daily.     calcium carbonate 200 MG capsule   Take 250 mg by mouth daily.     clopidogrel 75 MG tablet  Commonly known as:  PLAVIX  Take 1 tablet (75 mg total) by mouth daily.     DULoxetine 30 MG capsule  Commonly known as:  CYMBALTA  Take 90 mg by mouth daily.     EPINEPHrine 0.3 mg/0.3 mL Soaj injection  Commonly known as:  EPI-PEN  Inject 0.3 mLs (0.3 mg total) into the muscle once.     ergocalciferol 50000 UNITS capsule  Commonly known as:  VITAMIN D2  Take 50,000 Units by mouth once a week. Take on Saturday     isosorbide mononitrate 30 MG 24 hr tablet  Commonly known as:  IMDUR  Take 0.5 tablets (15 mg total) by mouth daily.     metoprolol succinate 25 MG 24 hr tablet  Commonly known as:  TOPROL-XL  Take 1 tablet (25 mg total) by mouth daily.     naproxen 500 MG tablet  Commonly known as:  NAPROSYN  Take 1 tablet (500 mg total) by mouth 2 (two) times daily.     nitroGLYCERIN 0.4 MG SL tablet  Commonly known as:  NITROSTAT  Place 1 tablet (0.4 mg total) under the tongue every 5 (five) minutes as needed. For chest pain     ondansetron 8 MG disintegrating tablet  Commonly known as:  ZOFRAN ODT  Take 1 tablet (8 mg total) by mouth every 8 (eight) hours as needed for nausea or vomiting.     pantoprazole 40 MG tablet  Commonly known as:  PROTONIX  TAKE 1 TABLET BY MOUTH DAILY.     pregabalin 75 MG capsule  Commonly known as:  LYRICA  Take 225 mg by mouth 2 (two) times daily.     ranolazine 1000 MG SR tablet  Commonly known as:  RANEXA  Take 1 tablet (1,000 mg total) by mouth 2 (two) times daily.     traMADol 50 MG tablet  Commonly known as:  ULTRAM  Take 50 mg by mouth 3 (three) times daily as needed. For pain       Follow-up Information    Follow up with [facility] On 11/08/2015.   Specialty:  Cardiology   Why:  3:00 PM Cardiology follow-up with Dr. Doug Sou PA   Contact information:   [address] [place], [place]: >30 MINUTES  Signed: Lyda Jester 10/31/2015, 4:31 PM

## 2015-10-31 NOTE — H&P (View-Only) (Signed)
Subjective:  Continues to have some vague chest discomfort that waxes and wanes.  Troponins have all been negative.  Mild dizziness according to nurse.  Blood pressure is soft.  Objective:  Vital Signs in the last 24 hours: BP 103/54 mmHg  Pulse 71  Temp(Src) 98.2 F (36.8 C) (Oral)  Resp 14  Ht 5\' 3"  (1.6 m)  Wt 99.338 kg (219 lb)  BMI 38.80 kg/m2  SpO2 96%  Physical Exam: Obese white female currently in no acute distress Lungs:  Clear Cardiac:  Regular rhythm, normal S1 and S2, no S3 Extremities:  No edema present  Intake/Output from previous day: 11/19 0701 - 11/20 0700 In: [id number] [P.O.:1380; I.V.:24] Out: 3425 [Urine:3425]  Weight Filed Weights   10/28/15 1831 10/29/15 0455 10/30/15 0400  Weight: 99.746 kg (219 lb 14.4 oz) 100.018 kg (220 lb 8 oz) 99.338 kg (219 lb)    Lab Results: Basic Metabolic Panel:  Recent Labs  10/28/15 1313 10/29/15 0347  NA 142 143  K 3.7 3.7  CL 105 106  CO2 26 32  GLUCOSE 130* 103*  BUN 13 16  CREATININE 0.73 0.80   CBC:  Recent Labs  10/28/15 1313 10/29/15 0347  WBC 5.0 6.1  HGB 12.8 12.2  HCT 37.9 36.2  MCV 91.3 91.6  PLT 196 175   Cardiac Enzymes: Troponin (Point of Care Test)  Recent Labs  10/28/15 1335  TROPIPOC 0.00   Cardiac Panel (last 3 results)  Recent Labs  10/28/15 1645 10/28/15 2227 10/29/15 0347  TROPONINI <0.03 <0.03 <0.03    Telemetry: Sinus rhythm  EKG: Sinus rhythm with incomplete right bundle branch block and left axis deviation  Assessment/Plan:  1.  Unstable angina pectoris with negative cardiac enzymes 2.  Hypertension controlled 3.  Fibromyalgia 4.  Hyperlipidemia  Recommendations:  Catheterization planned on Monday. Cardiac catheterization was discussed with the patient fully including risks of myocardial infarction, death, stroke, bleeding, arrhythmia, dye allergy, renal insufficiency or bleeding.  The patient understands and is willing to proceed.  Possibility of  intervention discussed with patient at the same setting.  Enzymes have been negative and we'll hold off on heparin in the absence of EKG changes right now.      Kerry Hough  MD [facility] Cardiology  10/30/2015, 8:30 AM

## 2015-10-31 NOTE — Progress Notes (Signed)
Patient discharge paperwork gone over in detail with patient. All questions answered to patient satisfaction. IV removed intact. Telemetry discontinued. Patient discharge to home with husband.

## 2015-11-01 MED FILL — Heparin Sodium (Porcine) 2 Unit/ML in Sodium Chloride 0.9%: INTRAMUSCULAR | Qty: 1000 | Status: AC

## 2015-11-01 NOTE — Discharge Summary (Signed)
Physician Discharge Summary  Patient ID: Courtney Steele MRN: [id number] DOB/AGE: November 26, 1951 64 y.o.   Primary Cardiologist: Previously Dr. Ron Parker Requesting Dr. Martinique  Admit date: 10/28/2015 Discharge date: 10/31/2015  Admission Diagnoses: Chest Pain with Moderate Risk for Cardiac Etiology  Discharge Diagnoses:  Principal Problem:  Unstable angina [facility]) Active Problems:  CAD (coronary artery disease)  Hypertension  Hyperlipidemia  Prediabetes  Pain in the chest   Discharged Condition: stable  Hospital Course:  64 y.o. female with past medical history of CAD (s/p DES to LAD in 2012), HLD, pre-diabetes and HTN. She underwent cardiac catheterization with DES to proximal LAD in 06/2011. She had recurrent chest pain and had cath 07/14/11 which revealed patent stent in the LAD and 50% stenosis in the mid-LAD with medical therapy recommended for her treatment. She had recurrent chest pain and had a third cath on 08/30/2011 with pressure wire analysis which revealed continued patency of prox LAD stent and nonobstructive CAD and normal pressure wire analysis of LAD. Last nuclear stress test was in 03/2013 which showed a small inferior scar with no evidence of ischemia.  She presented to [facility] on 10/28/15 with complaint of chest pain, described and resting chest tightness radiating across her chest, into her neck and down her left arm, "waxing and waning" in intensity. While in the ED, her initial troponin was negative. EKG showed NSR with known incomplete right bundle branch block. Given her history and nature of her symptoms, she was admitted to telemetry. Cardiac enzymes were cycled and remained negative x 3. She had relief of pain with IV NTG. On 10/31/15, she underwent a cardiac cath by Dr. Martinique for definitive assessment. This showed nonobstructive CAD. The stent in the proximal LAD was widely patent. The proximal to mid RCA had 10% stenosis. LV systolic function was normal at 55-65%.  Continued medical therapy was recommended. Her Ranexa was increased to 1000 mg BID. She left the cath lab in stable condition. She was monitored post cath and had no post cath complications. She was last seen and examined by Dr. Oval Linsey who determined she was stable for discharge home. Post hospital f/u has been scheduled with Rosaria Ferries, PA-C on 11/08/15. She was given a work excuse to return to work on 11/04/15.    Consults: None  Significant Diagnostic Studies:  Cardiac Panel (last 3 results)  Recent Labs (last 2 labs)      Recent Labs  10/28/15 1645 10/28/15 2227 10/29/15 0347  TROPONINI <0.03 <0.03 <0.03     LHC 10/31/15 Conclusion     Prox RCA to Mid RCA lesion, 10% stenosed.  Mid LAD lesion, 30% stenosed.  The left ventricular systolic function is normal.  1. Nonobstructive CAD. The stent in the proximal LAD is widely patent.  2. Normal LV function.  Plan: continue medical therapy. Consider alternative causes for chest pain.     Treatments: See Hospital Course  Discharge Exam: Blood pressure 120/66, pulse 66, temperature 98.6 F (37 C), temperature source Oral, resp. rate 14, height 5\' 3"  (1.6 m), weight 220 lb 12.8 oz (100.154 kg), SpO2 98 %.   Disposition: 01-Home or Self Care      Discharge Instructions    Diet - low sodium heart healthy  Complete by: As directed      Increase activity slowly  Complete by: As directed             Medication List    TAKE these medications  albuterol 108 (90 BASE) MCG/ACT inhaler  Commonly known as: PROVENTIL HFA;VENTOLIN HFA  Inhale 1-2 puffs into the lungs every 6 (six) hours as needed for wheezing or shortness of breath.     amLODipine 2.5 MG tablet  Commonly known as: NORVASC  Take 1 tablet (2.5 mg total) by mouth daily.     aspirin EC 81 MG tablet  Take 81 mg by mouth daily.     atorvastatin 40 MG tablet  Commonly known as:  LIPITOR  Take 1 tablet (40 mg total) by mouth daily.     calcium carbonate 200 MG capsule  Take 250 mg by mouth daily.     clopidogrel 75 MG tablet  Commonly known as: PLAVIX  Take 1 tablet (75 mg total) by mouth daily.     DULoxetine 30 MG capsule  Commonly known as: CYMBALTA  Take 90 mg by mouth daily.     EPINEPHrine 0.3 mg/0.3 mL Soaj injection  Commonly known as: EPI-PEN  Inject 0.3 mLs (0.3 mg total) into the muscle once.     ergocalciferol 50000 UNITS capsule  Commonly known as: VITAMIN D2  Take 50,000 Units by mouth once a week. Take on Saturday     isosorbide mononitrate 30 MG 24 hr tablet  Commonly known as: IMDUR  Take 0.5 tablets (15 mg total) by mouth daily.     metoprolol succinate 25 MG 24 hr tablet  Commonly known as: TOPROL-XL  Take 1 tablet (25 mg total) by mouth daily.     naproxen 500 MG tablet  Commonly known as: NAPROSYN  Take 1 tablet (500 mg total) by mouth 2 (two) times daily.     nitroGLYCERIN 0.4 MG SL tablet  Commonly known as: NITROSTAT  Place 1 tablet (0.4 mg total) under the tongue every 5 (five) minutes as needed. For chest pain     ondansetron 8 MG disintegrating tablet  Commonly known as: ZOFRAN ODT  Take 1 tablet (8 mg total) by mouth every 8 (eight) hours as needed for nausea or vomiting.     pantoprazole 40 MG tablet  Commonly known as: PROTONIX  TAKE 1 TABLET BY MOUTH DAILY.     pregabalin 75 MG capsule  Commonly known as: LYRICA  Take 225 mg by mouth 2 (two) times daily.     ranolazine 1000 MG SR tablet  Commonly known as: RANEXA  Take 1 tablet (1,000 mg total) by mouth 2 (two) times daily.     traMADol 50 MG tablet  Commonly known as: ULTRAM  Take 50 mg by mouth 3 (three) times daily as needed. For pain       Follow-up Information    Follow up with [facility] On 11/08/2015.   Specialty: Cardiology   Why: 3:00  PM Cardiology follow-up with Dr. Doug Sou PA   Contact information:   [address] [place], [place]: >30 MINUTES  Signed: Lyda Jester 10/31/2015, 4:31 PM        Cosigned by: Skeet Latch, MD at 11/01/2015 1:41 PM  Revision History     Date/Time User Provider Type Action   11/01/2015 1:41 PM Skeet Latch, MD Physician Cosign   10/31/2015 4:32 PM [place], PA-C Physician Assistant Sign

## 2015-11-08 ENCOUNTER — Ambulatory Visit: Payer: 59 | Admitting: Physician Assistant

## 2015-11-14 ENCOUNTER — Encounter: Payer: Self-pay | Admitting: Cardiology

## 2015-11-14 ENCOUNTER — Ambulatory Visit (INDEPENDENT_AMBULATORY_CARE_PROVIDER_SITE_OTHER): Payer: 59 | Admitting: Cardiology

## 2015-11-14 VITALS — BP 128/80 | HR 76 | Ht 63.0 in | Wt 226.2 lb

## 2015-11-14 DIAGNOSIS — I2510 Atherosclerotic heart disease of native coronary artery without angina pectoris: Secondary | ICD-10-CM | POA: Diagnosis not present

## 2015-11-14 DIAGNOSIS — I200 Unstable angina: Secondary | ICD-10-CM

## 2015-11-14 DIAGNOSIS — I10 Essential (primary) hypertension: Secondary | ICD-10-CM | POA: Diagnosis not present

## 2015-11-14 DIAGNOSIS — E669 Obesity, unspecified: Secondary | ICD-10-CM

## 2015-11-14 DIAGNOSIS — M797 Fibromyalgia: Secondary | ICD-10-CM

## 2015-11-14 DIAGNOSIS — E785 Hyperlipidemia, unspecified: Secondary | ICD-10-CM

## 2015-11-14 DIAGNOSIS — Z9861 Coronary angioplasty status: Secondary | ICD-10-CM

## 2015-11-14 NOTE — Assessment & Plan Note (Signed)
BMI 40 

## 2015-11-14 NOTE — Assessment & Plan Note (Signed)
Controlled.  

## 2015-11-14 NOTE — Assessment & Plan Note (Signed)
DES to the LAD July 06, 2011  / Relook catheter July 14, 2011, Sept 2012, negative Myoview April 2014, negative cath Nov 2016

## 2015-11-14 NOTE — Assessment & Plan Note (Signed)
Admitted 10/28/15-10/31/15, cath showed patent RCA, normal LVF, no other significant disease.

## 2015-11-14 NOTE — Assessment & Plan Note (Signed)
Followed by Dr Hawks. Rx'd with Cymbalta, Lyrica, and prn NSAIDs 

## 2015-11-14 NOTE — Assessment & Plan Note (Signed)
LDL 61 10/29/15

## 2015-11-14 NOTE — Patient Instructions (Signed)
Kerin Ransom, [place], has made no changes today in your current medications or treatment plan.  Your physician recommends that you schedule a follow-up appointment in 6 months with Dr Martinique. You will receive a reminder letter in the mail two months in advance. If you don't receive a letter, please call our office to schedule the follow-up appointment.  If you need a refill on your cardiac medications before your next appointment, please call your pharmacy.

## 2015-11-14 NOTE — Progress Notes (Signed)
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 She has a history of CAD and had an LAD DES in 2012. She had a negative cath in Aug 2012, Sept 2012, and a negative Myoview in April 2014. She presented to [facility] on 10/28/15 after she had SSCP "pressure" similar to her pre PCI symptoms on her way to work. She was admitted and ruled out for an MI. Cath done 10/31/15 showed patent LAD stent without significant residual disease. EF was 55-65%. Her Ranexa was increased. She is in the office ttoday for follow up. She denies any recurrent chest pain.    Current Outpatient Prescriptions  Medication Sig Dispense Refill  . albuterol (PROVENTIL HFA;VENTOLIN HFA) 108 (90 BASE) MCG/ACT inhaler Inhale 1-2 puffs into the lungs every 6 (six) hours as needed for wheezing or shortness of breath. 1 Inhaler 0  . amLODipine (NORVASC) 2.5 MG tablet Take 1 tablet (2.5 mg total) by mouth daily. 90 tablet 3  . aspirin EC 81 MG tablet Take 81 mg by mouth daily.    Marland Kitchen atorvastatin (LIPITOR) 40 MG tablet Take 1 tablet (40 mg total) by mouth daily. 90 tablet 3  . calcium carbonate 200 MG capsule Take 250 mg by mouth daily.      . clopidogrel (PLAVIX) 75 MG tablet Take 1 tablet (75 mg total) by mouth daily. 90 tablet 3  . DULoxetine (CYMBALTA) 30 MG capsule Take 90 mg by mouth daily.     Marland Kitchen EPINEPHrine 0.3 mg/0.3 mL IJ SOAJ injection Inject 0.3 mLs (0.3 mg total) into the muscle once. 1 Device 0  . ergocalciferol (VITAMIN D2) 50000 UNITS capsule Take 50,000 Units by mouth once a week. Take on Saturday    . isosorbide mononitrate (IMDUR) 30 MG 24 hr tablet Take 0.5 tablets (15 mg total) by mouth daily. 90 tablet 3  . metoprolol succinate (TOPROL-XL) 25 MG 24 hr tablet Take 1 tablet (25 mg total) by mouth daily. 90 tablet 3  . naproxen (NAPROSYN) 500  MG tablet Take 1 tablet (500 mg total) by mouth 2 (two) times daily. (Patient taking differently: Take 500 mg by mouth 2 (two) times daily as needed (pain). ) 20 tablet 0  . nitroGLYCERIN (NITROSTAT) 0.4 MG SL tablet Place 1 tablet (0.4 mg total) under the tongue every 5 (five) minutes as needed. For chest pain 25 tablet 3  . ondansetron (ZOFRAN ODT) 8 MG disintegrating tablet Take 1 tablet (8 mg total) by mouth every 8 (eight) hours as needed for nausea or vomiting. 10 tablet 0  . pantoprazole (PROTONIX) 40 MG tablet TAKE 1 TABLET BY MOUTH DAILY. 30 tablet 11  . pregabalin (LYRICA) 75 MG capsule Take 225 mg by mouth 2 (two) times daily.     . ranolazine (RANEXA) 1000 MG SR tablet Take 1 tablet (1,000 mg total) by mouth 2 (two) times daily. 180 tablet 3  . traMADol (ULTRAM) 50 MG tablet Take 50 mg by mouth 3 (three) times daily as needed. For pain    . [DISCONTINUED] albuterol (PROVENTIL,VENTOLIN) 90 MCG/ACT inhaler Inhale 1-2 puffs into the lungs every 4 (four) hours as needed. For wheezing     No current facility-administered medications for this visit.    Allergies  Allergen Reactions  . Beef-Derived Products   . Benazepril Cough  .  Erythromycin Diarrhea  . Indomethacin Diarrhea  . Other     No mammal protein substances  . Biaxin [Clarithromycin] Rash  . Penicillins Rash    Social History   Social History  . Marital Status: Married    Spouse Name: N/A  . Number of Children: 2  . Years of Education: N/A   Occupational History  . [employer]   Social History Main Topics  . Smoking status: Never Smoker   . Smokeless tobacco: Never Used  . Alcohol Use: 0.0 oz/week    0 Standard drinks or equivalent per week     Comment: Occasional - few drinks a month  . Drug Use: No  . Sexual Activity: Not on file   Other Topics Concern  . Not on file   Social History Narrative     Review of Systems: General: negative for chills, fever, night sweats or weight changes.    Cardiovascular: negative for chest pain, dyspnea on exertion, edema, orthopnea, palpitations, paroxysmal nocturnal dyspnea or shortness of breath Dermatological: negative for rash Respiratory: negative for cough or wheezing Urologic: negative for hematuria Abdominal: negative for nausea, vomiting, diarrhea, bright red blood per rectum, melena, or hematemesis Neurologic: negative for visual changes, syncope, or dizziness All other systems reviewed and are otherwise negative except as noted above.    Blood pressure 128/80, pulse 76, height 5\' 3"  (1.6 m), weight 226 lb 3.2 oz (102.604 kg).  General appearance: alert, cooperative, no distress and moderately obese Neck: no JVD Lungs: clear to auscultation bilaterally Heart: regular rate and rhythm Extremities: Rt radial site without hematoma Neurologic: Grossly normal   ASSESSMENT AND PLAN:   Unstable angina ([place]) Admitted 10/28/15-10/31/15, cath showed patent RCA, normal LVF, no other significant disease.  CAD S/P LAD DES 2012 DES to the LAD July 06, 2011  / Relook catheter July 14, 2011, Sept 2012, negative Myoview April 2014, negative cath Nov 2016  Hypertension Controlled  Hyperlipidemia LDL 61 10/29/15  Obesity BMI 40  Fibromyalgia Followed by Dr Lenna Gilford. Rx'd with Cymbalta, Lyrica, and prn NSAIDs   PLAN  Same Rx, f/u with Dr Martinique in 6 months. She asked about taking NSAIDs for her fibromyalgia. She says she only takes them on a PRN basis "when the weather is changing". I told her I thought PRN use would be OK.   Courtney Steele K PA-C 11/14/2015 8:21 AM

## 2016-01-03 DIAGNOSIS — M797 Fibromyalgia: Secondary | ICD-10-CM | POA: Diagnosis not present

## 2016-01-03 DIAGNOSIS — M150 Primary generalized (osteo)arthritis: Secondary | ICD-10-CM | POA: Diagnosis not present

## 2016-01-03 DIAGNOSIS — Z79899 Other long term (current) drug therapy: Secondary | ICD-10-CM | POA: Diagnosis not present

## 2016-02-07 MED FILL — PANTOPRAZOLE SOD DR 40 MG T: 40 | 90 days supply | Qty: 90 | Fill #1

## 2016-02-07 MED FILL — CLOPIDOGREL 75 MG TABLET: 75 | 90 days supply | Qty: 90 | Fill #1

## 2016-02-27 MED FILL — ISOSORBIDE MN ER 30 MG TAB: 30 | 90 days supply | Qty: 45 | Fill #1

## 2016-02-27 MED FILL — DULoxetine HCL 30 MG CPEP: 30 | 90 days supply | Qty: 270 | Fill #0 | Status: TO

## 2016-03-22 MED FILL — LYRICA 75 MG CAPSULE: 75 | 90 days supply | Qty: 270 | Fill #0 | Status: TO

## 2016-03-22 MED FILL — RANEXA ER 1,000 MG TABLET: 1000 | 90 days supply | Qty: 180 | Fill #1 | Status: TO

## 2016-04-04 MED FILL — METOPROLOL SUCC ER 25 MG TA: 25 | 90 days supply | Qty: 90 | Fill #3

## 2016-04-04 MED FILL — AMLODIPINE BESYLATE 2.5 MG: 2.5 | 90 days supply | Qty: 90 | Fill #3

## 2016-05-02 DIAGNOSIS — M797 Fibromyalgia: Secondary | ICD-10-CM | POA: Diagnosis not present

## 2016-05-02 DIAGNOSIS — E7800 Pure hypercholesterolemia, unspecified: Secondary | ICD-10-CM | POA: Diagnosis not present

## 2016-05-02 DIAGNOSIS — F322 Major depressive disorder, single episode, severe without psychotic features: Secondary | ICD-10-CM | POA: Diagnosis not present

## 2016-05-02 DIAGNOSIS — E118 Type 2 diabetes mellitus with unspecified complications: Secondary | ICD-10-CM | POA: Diagnosis not present

## 2016-05-02 DIAGNOSIS — I119 Hypertensive heart disease without heart failure: Secondary | ICD-10-CM | POA: Diagnosis not present

## 2016-05-02 DIAGNOSIS — I2510 Atherosclerotic heart disease of native coronary artery without angina pectoris: Secondary | ICD-10-CM | POA: Diagnosis not present

## 2016-05-03 ENCOUNTER — Other Ambulatory Visit: Payer: Self-pay | Admitting: Physician Assistant

## 2016-05-03 MED FILL — PANTOPRAZOLE SOD DR 40 MG T: 40 | 90 days supply | Qty: 90 | Fill #2 | Status: TO

## 2016-05-04 MED FILL — CLOPIDOGREL 75 MG TABLET: 75 | 90 days supply | Qty: 90 | Fill #0

## 2016-05-04 MED FILL — ATORVASTATIN 20 MG TABLET: 20 | 90 days supply | Qty: 90 | Fill #0

## 2016-05-15 ENCOUNTER — Ambulatory Visit (INDEPENDENT_AMBULATORY_CARE_PROVIDER_SITE_OTHER): Payer: Medicare Other | Admitting: Cardiology

## 2016-05-15 ENCOUNTER — Encounter: Payer: Self-pay | Admitting: Cardiology

## 2016-05-15 VITALS — BP 117/70 | HR 87 | Ht 63.0 in | Wt 226.0 lb

## 2016-05-15 DIAGNOSIS — Z9861 Coronary angioplasty status: Secondary | ICD-10-CM

## 2016-05-15 DIAGNOSIS — I2510 Atherosclerotic heart disease of native coronary artery without angina pectoris: Secondary | ICD-10-CM

## 2016-05-15 DIAGNOSIS — E785 Hyperlipidemia, unspecified: Secondary | ICD-10-CM

## 2016-05-15 DIAGNOSIS — I10 Essential (primary) hypertension: Secondary | ICD-10-CM

## 2016-05-15 DIAGNOSIS — M797 Fibromyalgia: Secondary | ICD-10-CM | POA: Diagnosis not present

## 2016-05-15 DIAGNOSIS — E669 Obesity, unspecified: Secondary | ICD-10-CM

## 2016-05-15 NOTE — Assessment & Plan Note (Signed)
Followed by PCP

## 2016-05-15 NOTE — Patient Instructions (Signed)
Medication Instructions:  Your physician recommends that you continue on your current medications as directed. Please refer to the Current Medication list given to you today.   Labwork: None ordered  Testing/Procedures: None ordered  Follow-Up: Your physician wants you to follow-up in: 6 months with Dr.Jordan You will receive a reminder letter in the mail two months in advance. If you don't receive a letter, please call our office to schedule the follow-up appointment.   Any Other Special Instructions Will Be Listed Below (If Applicable).     If you need a refill on your cardiac medications before your next appointment, please call your pharmacy.   

## 2016-05-15 NOTE — Assessment & Plan Note (Signed)
Controlled.  

## 2016-05-15 NOTE — Assessment & Plan Note (Signed)
DES to the LAD July 06, 2011  / Relook catheter July 14, 2011, Sept 2012, negative Myoview April 2014, negative cath Nov 2016 No complaints of chest pain

## 2016-05-15 NOTE — Progress Notes (Signed)
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 She has a history of CAD and had an LAD DES in 2012. She had a negative cath in Aug 2012, Sept 2012, and a negative Myoview in April 2014. She presented to [facility] on 10/28/15 after she had SSCP "pressure" similar to her pre PCI symptoms. She was admitted and ruled out for an MI. Cath done 10/31/15 showed patent LAD stent without significant residual disease. EF was 55-65%. Her Ranexa was increased. She has done well since. She is in the office today for a 6 month follow up. She denies any recurrent chest pain.    Current Outpatient Prescriptions  Medication Sig Dispense Refill  . albuterol (PROVENTIL HFA;VENTOLIN HFA) 108 (90 BASE) MCG/ACT inhaler Inhale 1-2 puffs into the lungs every 6 (six) hours as needed for wheezing or shortness of breath. 1 Inhaler 0  . amLODipine (NORVASC) 2.5 MG tablet Take 1 tablet (2.5 mg total) by mouth daily. 90 tablet 3  . aspirin EC 81 MG tablet Take 81 mg by mouth daily.    Marland Kitchen atorvastatin (LIPITOR) 20 MG tablet TAKE 1 TABLET BY MOUTH DAILY. 90 tablet 0  . clopidogrel (PLAVIX) 75 MG tablet TAKE 1 TABLET BY MOUTH DAILY. 90 tablet 0  . DULoxetine (CYMBALTA) 30 MG capsule Take 90 mg by mouth daily.     Marland Kitchen EPINEPHrine 0.3 mg/0.3 mL IJ SOAJ injection Inject 0.3 mLs (0.3 mg total) into the muscle once. 1 Device 0  . ergocalciferol (VITAMIN D2) 50000 UNITS capsule Take 50,000 Units by mouth once a week. Take on Saturday    . isosorbide mononitrate (IMDUR) 30 MG 24 hr tablet Take 0.5 tablets (15 mg total) by mouth daily. 90 tablet 3  . metoprolol succinate (TOPROL-XL) 25 MG 24 hr tablet Take 1 tablet (25 mg total) by mouth daily. 90 tablet 3  . naproxen (NAPROSYN) 500 MG tablet Take 1 tablet (500 mg total) by mouth 2 (two) times  daily. (Patient taking differently: Take 500 mg by mouth 2 (two) times daily as needed (pain). ) 20 tablet 0  . nitroGLYCERIN (NITROSTAT) 0.4 MG SL tablet Place 1 tablet (0.4 mg total) under the tongue every 5 (five) minutes as needed. For chest pain 25 tablet 3  . ondansetron (ZOFRAN ODT) 8 MG disintegrating tablet Take 1 tablet (8 mg total) by mouth every 8 (eight) hours as needed for nausea or vomiting. 10 tablet 0  . pantoprazole (PROTONIX) 40 MG tablet TAKE 1 TABLET BY MOUTH DAILY. 30 tablet 11  . pregabalin (LYRICA) 75 MG capsule Take 225 mg by mouth 2 (two) times daily.     . ranolazine (RANEXA) 1000 MG SR tablet Take 1 tablet (1,000 mg total) by mouth 2 (two) times daily. 180 tablet 3  . traMADol (ULTRAM) 50 MG tablet Take 50 mg by mouth 3 (three) times daily as needed. For pain    . [DISCONTINUED] albuterol (PROVENTIL,VENTOLIN) 90 MCG/ACT inhaler Inhale 1-2 puffs into the lungs every 4 (four) hours as needed. For wheezing     No current facility-administered medications for this visit.    Allergies  Allergen Reactions  . Beef-Derived Products   . Benazepril Cough  . Erythromycin Diarrhea  . Indomethacin Diarrhea  . Other     No  mammal protein substances  . Biaxin [Clarithromycin] Rash  . Penicillins Rash    Social History   Social History  . Marital Status: Married    Spouse Name: N/A  . Number of Children: 2  . Years of Education: N/A   Occupational History  . [employer]   Social History Main Topics  . Smoking status: Never Smoker   . Smokeless tobacco: Never Used  . Alcohol Use: 0.0 oz/week    0 Standard drinks or equivalent per week     Comment: Occasional - few drinks a month  . Drug Use: No  . Sexual Activity: Not on file   Other Topics Concern  . Not on file   Social History Narrative     Review of Systems: General: negative for chills, fever, night sweats or weight changes.  Cardiovascular: negative for chest pain, dyspnea on exertion,  edema, orthopnea, palpitations, paroxysmal nocturnal dyspnea or shortness of breath Dermatological: negative for rash Respiratory: negative for cough or wheezing Urologic: negative for hematuria Abdominal: negative for nausea, vomiting, diarrhea, bright red blood per rectum, melena, or hematemesis Neurologic: negative for visual changes, syncope, or dizziness All other systems reviewed and are otherwise negative except as noted above.    Blood pressure 117/70, pulse 87, height 5\' 3"  (1.6 m), weight 226 lb (102.513 kg).  General appearance: alert, cooperative, no distress and moderately obese Neck: no carotid bruit and no JVD Lungs: clear to auscultation bilaterally Heart: regular rate and rhythm Neurologic: Grossly normal  EKG NSR, incomplete RBBB  ASSESSMENT AND PLAN:   CAD S/P LAD DES 2012 DES to the LAD July 06, 2011  / Relook catheter July 14, 2011, Sept 2012, negative Myoview April 2014, negative cath Nov 2016 No complaints of chest pain  Hypertension Controlled  Hyperlipidemia Followed by PCP  Fibromyalgia Followed by Dr Lenna Gilford. Rx'd with Cymbalta, Lyrica, and prn NSAIDs  Obesity BMI 40   PLAN  Same Rx-f/u Dr Martinique in 6 months  Kerin Ransom PA-C 05/15/2016 2:13 PM

## 2016-05-15 NOTE — Assessment & Plan Note (Signed)
Followed by Dr Lenna Gilford. Rx'd with Cymbalta, Lyrica, and prn NSAIDs

## 2016-05-15 NOTE — Assessment & Plan Note (Signed)
BMI 40 

## 2016-06-13 ENCOUNTER — Other Ambulatory Visit: Payer: Self-pay | Admitting: *Deleted

## 2016-06-13 DIAGNOSIS — I10 Essential (primary) hypertension: Secondary | ICD-10-CM

## 2016-06-13 DIAGNOSIS — I2510 Atherosclerotic heart disease of native coronary artery without angina pectoris: Secondary | ICD-10-CM

## 2016-06-13 MED ORDER — AMLODIPINE BESYLATE 2.5 MG PO TABS: 2.5000 mg | ORAL_TABLET | Freq: Every day | ORAL | Status: DC

## 2016-06-13 MED ORDER — RANOLAZINE ER 1000 MG PO TB12: 1000.0000 mg | ORAL_TABLET | Freq: Two times a day (BID) | ORAL | Status: DC

## 2016-06-13 MED ORDER — CLOPIDOGREL BISULFATE 75 MG PO TABS: 75.0000 mg | ORAL_TABLET | Freq: Every day | ORAL | Status: DC

## 2016-06-13 MED ORDER — METOPROLOL SUCCINATE ER 25 MG PO TB24: 25.0000 mg | ORAL_TABLET | Freq: Every day | ORAL | Status: DC

## 2016-06-13 MED ORDER — ATORVASTATIN CALCIUM 20 MG PO TABS: 20.0000 mg | ORAL_TABLET | Freq: Every day | ORAL | Status: DC

## 2016-06-13 MED ORDER — ISOSORBIDE MONONITRATE ER 30 MG PO TB24
15.0000 mg | ORAL_TABLET | Freq: Every day | ORAL | Status: DC
Start: 2016-06-13 — End: 2016-07-31

## 2016-06-13 MED ORDER — NITROGLYCERIN 0.4 MG SL SUBL: 0.4000 mg | SUBLINGUAL_TABLET | SUBLINGUAL | Status: DC | PRN

## 2016-06-13 NOTE — Telephone Encounter (Signed)
Rx Refill

## 2016-07-02 DIAGNOSIS — M797 Fibromyalgia: Secondary | ICD-10-CM | POA: Diagnosis not present

## 2016-07-02 DIAGNOSIS — Z79899 Other long term (current) drug therapy: Secondary | ICD-10-CM | POA: Diagnosis not present

## 2016-07-02 DIAGNOSIS — M150 Primary generalized (osteo)arthritis: Secondary | ICD-10-CM | POA: Diagnosis not present

## 2016-07-24 ENCOUNTER — Encounter (HOSPITAL_COMMUNITY): Payer: Self-pay | Admitting: *Deleted

## 2016-07-24 DIAGNOSIS — R197 Diarrhea, unspecified: Secondary | ICD-10-CM | POA: Diagnosis present

## 2016-07-24 DIAGNOSIS — E119 Type 2 diabetes mellitus without complications: Secondary | ICD-10-CM | POA: Diagnosis not present

## 2016-07-24 DIAGNOSIS — I10 Essential (primary) hypertension: Secondary | ICD-10-CM | POA: Insufficient documentation

## 2016-07-24 DIAGNOSIS — Z7982 Long term (current) use of aspirin: Secondary | ICD-10-CM | POA: Diagnosis not present

## 2016-07-24 DIAGNOSIS — I2510 Atherosclerotic heart disease of native coronary artery without angina pectoris: Secondary | ICD-10-CM | POA: Diagnosis not present

## 2016-07-24 DIAGNOSIS — Z79899 Other long term (current) drug therapy: Secondary | ICD-10-CM | POA: Diagnosis not present

## 2016-07-24 DIAGNOSIS — K5792 Diverticulitis of intestine, part unspecified, without perforation or abscess without bleeding: Secondary | ICD-10-CM | POA: Diagnosis not present

## 2016-07-24 DIAGNOSIS — K5732 Diverticulitis of large intestine without perforation or abscess without bleeding: Secondary | ICD-10-CM | POA: Diagnosis not present

## 2016-07-24 MED ORDER — ONDANSETRON 4 MG PO TBDP
ORAL_TABLET | ORAL | Status: AC
  Filled 2016-07-24: qty 1

## 2016-07-24 MED ORDER — ACETAMINOPHEN 325 MG PO TABS
ORAL_TABLET | ORAL | Status: AC
  Filled 2016-07-24: qty 2

## 2016-07-24 MED ORDER — ONDANSETRON 4 MG PO TBDP
4.0000 mg | ORAL_TABLET | Freq: Once | ORAL | Status: AC | PRN
  Administered 2016-07-24: 4 mg via ORAL

## 2016-07-24 MED ORDER — ACETAMINOPHEN 325 MG PO TABS
650.0000 mg | ORAL_TABLET | Freq: Once | ORAL | Status: AC
  Administered 2016-07-24: 650 mg via ORAL

## 2016-07-24 NOTE — ED Triage Notes (Signed)
Pt c/o left upper and lower abdominal pain, diarrhea, fever, and nausea starting today.  Diarrhea x 1, temp at home 100.4

## 2016-07-25 ENCOUNTER — Encounter (HOSPITAL_COMMUNITY): Payer: Self-pay | Admitting: Radiology

## 2016-07-25 ENCOUNTER — Emergency Department (HOSPITAL_COMMUNITY): Payer: Medicare Other

## 2016-07-25 ENCOUNTER — Emergency Department (HOSPITAL_COMMUNITY)
Admission: EM | Admit: 2016-07-25 | Discharge: 2016-07-25 | Disposition: A | Discharge status: Home / Self Care | Payer: Medicare Other | Attending: Emergency Medicine | Admitting: Emergency Medicine

## 2016-07-25 DIAGNOSIS — K5792 Diverticulitis of intestine, part unspecified, without perforation or abscess without bleeding: Secondary | ICD-10-CM

## 2016-07-25 DIAGNOSIS — K5732 Diverticulitis of large intestine without perforation or abscess without bleeding: Secondary | ICD-10-CM | POA: Diagnosis not present

## 2016-07-25 LAB — COMPREHENSIVE METABOLIC PANEL
ALT: 16 U/L (ref 14–54)
ANION GAP: 8 (ref 5–15)
AST: 20 U/L (ref 15–41)
Albumin: 3.6 g/dL (ref 3.5–5.0)
Alkaline Phosphatase: 53 U/L (ref 38–126)
BILIRUBIN TOTAL: 1 mg/dL (ref 0.3–1.2)
BUN: 19 mg/dL (ref 6–20)
CO2: 25 mmol/L (ref 22–32)
Calcium: 9.1 mg/dL (ref 8.9–10.3)
Chloride: 107 mmol/L (ref 101–111)
Creatinine, Ser: 0.83 mg/dL (ref 0.44–1.00)
Glucose, Bld: 128 mg/dL — ABNORMAL HIGH (ref 65–99)
POTASSIUM: 3.9 mmol/L (ref 3.5–5.1)
Sodium: 140 mmol/L (ref 135–145)
TOTAL PROTEIN: 6.4 g/dL — AB (ref 6.5–8.1)

## 2016-07-25 LAB — URINALYSIS, ROUTINE W REFLEX MICROSCOPIC
Bilirubin Urine: NEGATIVE
Glucose, UA: NEGATIVE mg/dL
Hgb urine dipstick: NEGATIVE
KETONES UR: NEGATIVE mg/dL
NITRITE: NEGATIVE
PH: 6 (ref 5.0–8.0)
Protein, ur: NEGATIVE mg/dL
Specific Gravity, Urine: 1.024 (ref 1.005–1.030)

## 2016-07-25 LAB — URINE MICROSCOPIC-ADD ON

## 2016-07-25 LAB — CBC
HEMATOCRIT: 40.5 % (ref 36.0–46.0)
HEMOGLOBIN: 13.2 g/dL (ref 12.0–15.0)
MCH: 30.7 pg (ref 26.0–34.0)
MCHC: 32.6 g/dL (ref 30.0–36.0)
MCV: 94.2 fL (ref 78.0–100.0)
Platelets: 223 10*3/uL (ref 150–400)
RBC: 4.3 MIL/uL (ref 3.87–5.11)
RDW: 14.1 % (ref 11.5–15.5)
WBC: 10.1 10*3/uL (ref 4.0–10.5)

## 2016-07-25 LAB — LIPASE, BLOOD: Lipase: 24 U/L (ref 11–51)

## 2016-07-25 MED ORDER — ONDANSETRON HCL 4 MG/2ML IJ SOLN
4.0000 mg | Freq: Once | INTRAMUSCULAR | Status: AC
  Administered 2016-07-25: 4 mg via INTRAVENOUS
  Filled 2016-07-25: qty 2

## 2016-07-25 MED ORDER — METRONIDAZOLE 500 MG PO TABS: 500.0000 mg | ORAL_TABLET | Freq: Two times a day (BID) | ORAL | 0 refills | Status: DC

## 2016-07-25 MED ORDER — ONDANSETRON 4 MG PO TBDP: 4.0000 mg | ORAL_TABLET | Freq: Three times a day (TID) | ORAL | 0 refills | Status: DC | PRN

## 2016-07-25 MED ORDER — CIPROFLOXACIN HCL 500 MG PO TABS: 500.0000 mg | ORAL_TABLET | Freq: Two times a day (BID) | ORAL | 0 refills | Status: DC

## 2016-07-25 MED ORDER — OXYCODONE-ACETAMINOPHEN 5-325 MG PO TABS: 1.0000 | ORAL_TABLET | Freq: Four times a day (QID) | ORAL | 0 refills | Status: DC | PRN

## 2016-07-25 MED ORDER — IOPAMIDOL (ISOVUE-300) INJECTION 61%
INTRAVENOUS | Status: AC
  Administered 2016-07-25: 100 mL
  Filled 2016-07-25: qty 100

## 2016-07-25 MED ORDER — MORPHINE SULFATE (PF) 4 MG/ML IV SOLN
4.0000 mg | Freq: Once | INTRAVENOUS | Status: AC
  Administered 2016-07-25: 4 mg via INTRAVENOUS
  Filled 2016-07-25: qty 1

## 2016-07-25 MED ORDER — SODIUM CHLORIDE 0.9 % IV BOLUS (SEPSIS)
1000.0000 mL | Freq: Once | INTRAVENOUS | Status: AC
  Administered 2016-07-25: 1000 mL via INTRAVENOUS

## 2016-07-25 MED ORDER — CIPROFLOXACIN IN D5W 400 MG/200ML IV SOLN
400.0000 mg | Freq: Once | INTRAVENOUS | Status: AC
  Administered 2016-07-25: 400 mg via INTRAVENOUS
  Filled 2016-07-25: qty 200

## 2016-07-25 MED ORDER — METRONIDAZOLE IN NACL 5-0.79 MG/ML-% IV SOLN
500.0000 mg | Freq: Once | INTRAVENOUS | Status: AC
  Administered 2016-07-25: 500 mg via INTRAVENOUS
  Filled 2016-07-25: qty 100

## 2016-07-25 NOTE — ED Provider Notes (Signed)
Patient reexamined 0930:  No acute abdomen. Discuss CT scan with patient and her husband. Discharge medications Cipro, Flagyl, Percocet, Zofran and milligrams   Nat Christen, MD 07/25/16 [phone]

## 2016-07-25 NOTE — ED Provider Notes (Signed)
[facility] DEPT Provider Note   CSN: [id number] Arrival date & time: 07/24/16  2315     History   Chief Complaint Chief Complaint  Patient presents with  . Nausea  . Diarrhea  . Fever    HPI Courtney Steele is a 65 y.o. female.  HPI  This is a 65 year old female with history of diabetes, coronary artery disease, diverticulitis who presents with abdominal pain and diarrhea. Patient reports onset of symptoms on Monday. She reports sharp mostly left lower quadrant pain that is nonradiating. Currently her pain is 5 out of 10. She also reports some right lower quadrant pain. She reports fevers at home to 100.4. She also reports one episode of nonbloody diarrhea yesterday. She reports nausea without vomiting. She has a history of diverticulitis which felt somewhat similar at least with the left sided pain.  Past Medical History:  Diagnosis Date  . Borderline diabetes   . CAD (coronary artery disease)    a. 2012: DES to LAD   . Degenerative joint disease   . Diabetes mellitus   . Fibromyalgia   . Groin hematoma   . Hematoma    Radial catheter site July 13, 2011  . Hyperlipidemia   . Hypertension   . Morbid obesity ([place])   . Osteoarthritis     Patient Active Problem List   Diagnosis Date Noted  . Obesity 11/14/2015  . Pain in the chest   . Unstable angina ([place]) 10/28/2015  . Prediabetes 10/28/2015  . Allergy with anaphylaxis due to food 08/08/2015  . Osteoarthritis   . Midsternal chest pain 02/23/2012  . Dizziness 10/29/2011  . CAD S/P LAD DES 2012   . Hypertension   . Hyperlipidemia   . Fibromyalgia   . Groin hematoma   . Hematoma     Past Surgical History:  Procedure Laterality Date  . ABDOMINAL HYSTERECTOMY    . ANGIOPLASTY    . BUNIONECTOMY    . CARDIAC CATHETERIZATION     revealing patency of the LAD stent with mild compromise of  the diagonal and moderate mid LAD stenosis, but does not appear to  be high grade.   Marland Kitchen CARDIAC CATHETERIZATION N/A  10/31/2015   Procedure: Left Heart Cath and Coronary Angiography;  Surgeon: Peter M Martinique, MD;  Location: [facility];  Service: Cardiovascular;  Laterality: N/A;  . CARPAL TUNNEL RELEASE    . CHOLECYSTECTOMY    . KNEE ARTHROSCOPY    . TONSILLECTOMY      OB History    No data available       Home Medications    Prior to Admission medications   Medication Sig Start Date End Date Taking? Authorizing Provider  albuterol (PROVENTIL HFA;VENTOLIN HFA) 108 (90 BASE) MCG/ACT inhaler Inhale 1-2 puffs into the lungs every 6 (six) hours as needed for wheezing or shortness of breath. 05/10/15   Debby Freiberg, MD  amLODipine (NORVASC) 2.5 MG tablet Take 1 tablet (2.5 mg total) by mouth daily. 06/13/16   Erlene Quan, PA-C  aspirin EC 81 MG tablet Take 81 mg by mouth daily.    Historical Provider, MD  atorvastatin (LIPITOR) 20 MG tablet Take 1 tablet (20 mg total) by mouth daily. 06/13/16   Erlene Quan, PA-C  clopidogrel (PLAVIX) 75 MG tablet Take 1 tablet (75 mg total) by mouth daily. 06/13/16   Erlene Quan, PA-C  DULoxetine (CYMBALTA) 30 MG capsule Take 90 mg by mouth daily.     Historical Provider,  MD  EPINEPHrine 0.3 mg/0.3 mL IJ SOAJ injection Inject 0.3 mLs (0.3 mg total) into the muscle once. 05/10/15   Debby Freiberg, MD  ergocalciferol (VITAMIN D2) 50000 UNITS capsule Take 50,000 Units by mouth once a week. Take on Saturday    Historical Provider, MD  isosorbide mononitrate (IMDUR) 30 MG 24 hr tablet Take 0.5 tablets (15 mg total) by mouth daily. 06/13/16   Erlene Quan, PA-C  metoprolol succinate (TOPROL-XL) 25 MG 24 hr tablet Take 1 tablet (25 mg total) by mouth daily. 06/13/16   Erlene Quan, PA-C  naproxen (NAPROSYN) 500 MG tablet Take 1 tablet (500 mg total) by mouth 2 (two) times daily. Patient taking differently: Take 500 mg by mouth 2 (two) times daily as needed (pain).  05/19/14   Jola Schmidt, MD  nitroGLYCERIN (NITROSTAT) 0.4 MG SL tablet Place 1 tablet (0.4 mg total) under the  tongue every 5 (five) minutes as needed. For chest pain 06/13/16   Erlene Quan, PA-C  ondansetron (ZOFRAN ODT) 8 MG disintegrating tablet Take 1 tablet (8 mg total) by mouth every 8 (eight) hours as needed for nausea or vomiting. 05/19/14   Jola Schmidt, MD  pantoprazole (PROTONIX) 40 MG tablet TAKE 1 TABLET BY MOUTH DAILY. 09/06/15   Carlena Bjornstad, MD  pregabalin (LYRICA) 75 MG capsule Take 225 mg by mouth 2 (two) times daily.     Historical Provider, MD  ranolazine (RANEXA) 1000 MG SR tablet Take 1 tablet (1,000 mg total) by mouth 2 (two) times daily. 06/13/16 06/13/17  Erlene Quan, PA-C  traMADol (ULTRAM) 50 MG tablet Take 50 mg by mouth 3 (three) times daily as needed. For pain    Historical Provider, MD    Family History Family History  Problem Relation Age of Onset  . Other Mother     died pulmonary issues  . Coronary artery disease Father 40    died- CABG-HTN- AAA    Social History Social History  Substance Use Topics  . Smoking status: Never Smoker  . Smokeless tobacco: Never Used  . Alcohol use 0.0 oz/week     Comment: Occasional - few drinks a month     Allergies   Beef-derived products; Benazepril; Erythromycin; Indomethacin; Other; Biaxin [clarithromycin]; and Penicillins   Review of Systems Review of Systems  Constitutional: Positive for fever.  Respiratory: Negative for cough, chest tightness and shortness of breath.   Cardiovascular: Negative for chest pain.  Gastrointestinal: Positive for abdominal pain, diarrhea and nausea. Negative for vomiting.  Genitourinary: Negative for dysuria.  All other systems reviewed and are negative.    Physical Exam Updated Vital Signs BP 119/55   Pulse 71   Temp 98.8 F (37.1 C) (Oral)   Resp 12   Ht 5\' 2"  (1.575 m)   Wt 220 lb (99.8 kg)   SpO2 97%   BMI 40.24 kg/m   Physical Exam  Constitutional: She is oriented to person, place, and time. She appears well-developed and well-nourished.  Overweight  HENT:  Head:  Normocephalic and atraumatic.  Cardiovascular: Normal rate, regular rhythm and normal heart sounds.   No murmur heard. Pulmonary/Chest: Effort normal. No respiratory distress. She has no wheezes.  Abdominal: Soft. Bowel sounds are normal. She exhibits no mass. There is tenderness. There is no guarding.  Left lower and right lower quadrant tenderness to palpation without rebound or guarding  Neurological: She is alert and oriented to person, place, and time.  Skin: Skin is warm and dry.  Psychiatric: She has a normal mood and affect.  Nursing note and vitals reviewed.    ED Treatments / Results  Labs (all labs ordered are listed, but only abnormal results are displayed) Labs Reviewed  COMPREHENSIVE METABOLIC PANEL - Abnormal; Notable for the following:       Result Value   Glucose, Bld 128 (*)    Total Protein 6.4 (*)    All other components within normal limits  URINALYSIS, ROUTINE W REFLEX MICROSCOPIC (NOT AT [facility]) - Abnormal; Notable for the following:    Leukocytes, UA TRACE (*)    All other components within normal limits  URINE MICROSCOPIC-ADD ON - Abnormal; Notable for the following:    Squamous Epithelial / LPF 6-30 (*)    Bacteria, UA FEW (*)    All other components within normal limits  LIPASE, BLOOD  CBC    EKG  EKG Interpretation None       Radiology No results found.  Procedures Procedures (including critical care time)  Medications Ordered in ED Medications  ondansetron (ZOFRAN-ODT) 4 MG disintegrating tablet (not administered)  acetaminophen (TYLENOL) 325 MG tablet (not administered)  sodium chloride 0.9 % bolus 1,000 mL (not administered)  ondansetron (ZOFRAN) injection 4 mg (not administered)  morphine 4 MG/ML injection 4 mg (not administered)  ciprofloxacin (CIPRO) IVPB 400 mg (not administered)  metroNIDAZOLE (FLAGYL) IVPB 500 mg (not administered)  ondansetron (ZOFRAN-ODT) disintegrating tablet 4 mg (4 mg Oral Given 07/24/16 2332)    acetaminophen (TYLENOL) tablet 650 mg (650 mg Oral Given 07/24/16 2332)     Initial Impression / Assessment and Plan / ED Course  I have reviewed the triage vital signs and the nursing notes.  Pertinent labs & imaging results that were available during my care of the patient were reviewed by me and considered in my medical decision making (see chart for details).  Clinical Course    Patient presents with fever, abdominal pain, and one episode of diarrhea. History of diverticulitis with similar symptoms; however, she is never had right-sided pain before. She is tender both the left and the right lower quadrants. She is otherwise nontoxic. Temperature of 100.4 upon arrival. Patient was given pain and nausea medication as well as fluids. She was empirically given Cipro and Flagyl. Given the right-sided pain, CT abdomen was obtained given that this is atypical. She could have diverticulitis in both the right and left colon or her symptoms could be masking an appendicitis. On recheck, she reports that she is more comfortable. She is awaiting CT.  Signed out to oncoming provider.  Final Clinical Impressions(s) / ED Diagnoses   Final diagnoses:  Diverticulitis of intestine without perforation or abscess without bleeding    New Prescriptions New Prescriptions   No medications on file     Merryl Hacker, MD 07/25/16 2253

## 2016-07-25 NOTE — Discharge Instructions (Signed)
You have descending colon diverticulitis.  Clear liquids for next 12-24 hours. Medication for pain and nausea, Cipro, Flagyl.

## 2016-07-25 NOTE — ED Notes (Signed)
Pt ambulated to restroom. Pt tolerated well.  

## 2016-07-31 ENCOUNTER — Other Ambulatory Visit: Payer: Self-pay

## 2016-07-31 DIAGNOSIS — I10 Essential (primary) hypertension: Secondary | ICD-10-CM

## 2016-07-31 DIAGNOSIS — I2510 Atherosclerotic heart disease of native coronary artery without angina pectoris: Secondary | ICD-10-CM

## 2016-07-31 MED ORDER — RANOLAZINE ER 1000 MG PO TB12: 1000.0000 mg | ORAL_TABLET | Freq: Two times a day (BID) | ORAL | 3 refills | Status: DC

## 2016-07-31 MED ORDER — METOPROLOL SUCCINATE ER 25 MG PO TB24: 25.0000 mg | ORAL_TABLET | Freq: Every day | ORAL | 3 refills | Status: DC

## 2016-07-31 MED ORDER — CLOPIDOGREL BISULFATE 75 MG PO TABS: 75.0000 mg | ORAL_TABLET | Freq: Every day | ORAL | 0 refills | Status: DC

## 2016-07-31 MED ORDER — PANTOPRAZOLE SODIUM 40 MG PO TBEC: 40.0000 mg | DELAYED_RELEASE_TABLET | Freq: Every day | ORAL | 11 refills | Status: DC

## 2016-07-31 MED ORDER — ATORVASTATIN CALCIUM 20 MG PO TABS: 20.0000 mg | ORAL_TABLET | Freq: Every day | ORAL | 0 refills | Status: DC

## 2016-07-31 MED ORDER — AMLODIPINE BESYLATE 2.5 MG PO TABS: 2.5000 mg | ORAL_TABLET | Freq: Every day | ORAL | 3 refills | Status: DC

## 2016-07-31 MED ORDER — ISOSORBIDE MONONITRATE ER 30 MG PO TB24: 15.0000 mg | ORAL_TABLET | Freq: Every day | ORAL | 3 refills | Status: DC

## 2016-08-02 ENCOUNTER — Other Ambulatory Visit: Payer: Self-pay

## 2016-08-24 DIAGNOSIS — M1711 Unilateral primary osteoarthritis, right knee: Secondary | ICD-10-CM | POA: Diagnosis not present

## 2016-08-24 DIAGNOSIS — M25561 Pain in right knee: Secondary | ICD-10-CM | POA: Diagnosis not present

## 2016-08-24 DIAGNOSIS — M2241 Chondromalacia patellae, right knee: Secondary | ICD-10-CM | POA: Diagnosis not present

## 2016-08-24 DIAGNOSIS — M7121 Synovial cyst of popliteal space [Baker], right knee: Secondary | ICD-10-CM | POA: Diagnosis not present

## 2016-10-03 DIAGNOSIS — M25561 Pain in right knee: Secondary | ICD-10-CM | POA: Diagnosis not present

## 2016-10-03 DIAGNOSIS — M2241 Chondromalacia patellae, right knee: Secondary | ICD-10-CM | POA: Diagnosis not present

## 2016-10-03 DIAGNOSIS — M1711 Unilateral primary osteoarthritis, right knee: Secondary | ICD-10-CM | POA: Diagnosis not present

## 2016-10-10 DIAGNOSIS — M25561 Pain in right knee: Secondary | ICD-10-CM | POA: Diagnosis not present

## 2016-10-10 DIAGNOSIS — M1711 Unilateral primary osteoarthritis, right knee: Secondary | ICD-10-CM | POA: Diagnosis not present

## 2016-10-10 DIAGNOSIS — M7121 Synovial cyst of popliteal space [Baker], right knee: Secondary | ICD-10-CM | POA: Diagnosis not present

## 2016-10-10 DIAGNOSIS — M2241 Chondromalacia patellae, right knee: Secondary | ICD-10-CM | POA: Diagnosis not present

## 2016-10-17 DIAGNOSIS — M7121 Synovial cyst of popliteal space [Baker], right knee: Secondary | ICD-10-CM | POA: Diagnosis not present

## 2016-10-17 DIAGNOSIS — M1711 Unilateral primary osteoarthritis, right knee: Secondary | ICD-10-CM | POA: Diagnosis not present

## 2016-10-17 DIAGNOSIS — M2241 Chondromalacia patellae, right knee: Secondary | ICD-10-CM | POA: Diagnosis not present

## 2016-10-17 DIAGNOSIS — M25561 Pain in right knee: Secondary | ICD-10-CM | POA: Diagnosis not present

## 2016-10-30 ENCOUNTER — Other Ambulatory Visit: Payer: Self-pay | Admitting: Cardiology

## 2016-10-30 DIAGNOSIS — I119 Hypertensive heart disease without heart failure: Secondary | ICD-10-CM | POA: Diagnosis not present

## 2016-10-30 DIAGNOSIS — Z23 Encounter for immunization: Secondary | ICD-10-CM | POA: Diagnosis not present

## 2016-10-30 DIAGNOSIS — K5730 Diverticulosis of large intestine without perforation or abscess without bleeding: Secondary | ICD-10-CM | POA: Diagnosis not present

## 2016-10-30 DIAGNOSIS — Z0000 Encounter for general adult medical examination without abnormal findings: Secondary | ICD-10-CM | POA: Diagnosis not present

## 2016-10-30 DIAGNOSIS — I2510 Atherosclerotic heart disease of native coronary artery without angina pectoris: Secondary | ICD-10-CM | POA: Diagnosis not present

## 2016-10-30 DIAGNOSIS — E7800 Pure hypercholesterolemia, unspecified: Secondary | ICD-10-CM | POA: Diagnosis not present

## 2016-10-30 DIAGNOSIS — E118 Type 2 diabetes mellitus with unspecified complications: Secondary | ICD-10-CM | POA: Diagnosis not present

## 2016-10-30 DIAGNOSIS — F322 Major depressive disorder, single episode, severe without psychotic features: Secondary | ICD-10-CM | POA: Diagnosis not present

## 2016-10-30 DIAGNOSIS — M797 Fibromyalgia: Secondary | ICD-10-CM | POA: Diagnosis not present

## 2016-10-30 DIAGNOSIS — M8580 Other specified disorders of bone density and structure, unspecified site: Secondary | ICD-10-CM | POA: Diagnosis not present

## 2016-10-30 DIAGNOSIS — M1990 Unspecified osteoarthritis, unspecified site: Secondary | ICD-10-CM | POA: Diagnosis not present

## 2016-10-30 DIAGNOSIS — E756 Lipid storage disorder, unspecified: Secondary | ICD-10-CM | POA: Diagnosis not present

## 2016-11-07 ENCOUNTER — Ambulatory Visit (INDEPENDENT_AMBULATORY_CARE_PROVIDER_SITE_OTHER): Payer: Medicare Other | Admitting: Cardiology

## 2016-11-07 ENCOUNTER — Encounter: Payer: Self-pay | Admitting: Cardiology

## 2016-11-07 VITALS — BP 116/78 | HR 80 | Ht 64.0 in | Wt 220.0 lb

## 2016-11-07 DIAGNOSIS — Z9861 Coronary angioplasty status: Secondary | ICD-10-CM

## 2016-11-07 DIAGNOSIS — M797 Fibromyalgia: Secondary | ICD-10-CM

## 2016-11-07 DIAGNOSIS — E785 Hyperlipidemia, unspecified: Secondary | ICD-10-CM | POA: Diagnosis not present

## 2016-11-07 DIAGNOSIS — I10 Essential (primary) hypertension: Secondary | ICD-10-CM | POA: Diagnosis not present

## 2016-11-07 DIAGNOSIS — I2510 Atherosclerotic heart disease of native coronary artery without angina pectoris: Secondary | ICD-10-CM

## 2016-11-07 DIAGNOSIS — E669 Obesity, unspecified: Secondary | ICD-10-CM | POA: Diagnosis not present

## 2016-11-07 DIAGNOSIS — T7800XD Anaphylactic reaction due to unspecified food, subsequent encounter: Secondary | ICD-10-CM

## 2016-11-07 NOTE — Assessment & Plan Note (Signed)
BMI 37. No change in her wgt since LOV

## 2016-11-07 NOTE — Assessment & Plan Note (Signed)
2016,  severe allergic reaction to meat protein

## 2016-11-07 NOTE — Assessment & Plan Note (Signed)
Controlled.  

## 2016-11-07 NOTE — Assessment & Plan Note (Signed)
Followed by her PCP- last LDL was 61 in 2016

## 2016-11-07 NOTE — Assessment & Plan Note (Signed)
DES to the LAD July 06, 2011  / Relook catheter July 14, 2011, Sept 2012, negative Myoview April 2014, negative cath Nov 2016. Ranexa added then for exertional chest pain and this helped, no complaints of chest pain

## 2016-11-07 NOTE — Patient Instructions (Signed)
Your physician wants you to follow-up in: 6 MONTHS or sooner if needed. You will receive a reminder letter in the mail two months in advance. If you don't receive a letter, please call our office to schedule the follow-up appointment.   If you need a refill on your cardiac medications before your next appointment, please call your pharmacy.

## 2016-11-07 NOTE — Progress Notes (Signed)
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 She has a history of CAD and had an LAD DES in 2012. She had a negative cath in Aug 2012, Sept 2012, and a negative Myoview in April 2014. She presented to [facility] on 10/28/15 after she had SSCP "pressure" similar to her pre PCI symptoms. She was admitted and ruled out for an MI. Cath done 10/31/15 showed patent LAD stent without significant residual disease. EF was 55-65%. Her Ranexa was increased. She has done well since. She is in the office today for a 6 month follow up. She denies any recurrent chest pain. She was at water aerobics for an hour today without problems.    Current Outpatient Prescriptions  Medication Sig Dispense Refill  . albuterol (PROVENTIL HFA;VENTOLIN HFA) 108 (90 BASE) MCG/ACT inhaler Inhale 1-2 puffs into the lungs every 6 (six) hours as needed for wheezing or shortness of breath. 1 Inhaler 0  . amLODipine (NORVASC) 2.5 MG tablet Take 1 tablet (2.5 mg total) by mouth daily. 90 tablet 3  . aspirin EC 81 MG tablet Take 81 mg by mouth daily.    Marland Kitchen atorvastatin (LIPITOR) 20 MG tablet TAKE 1 TABLET BY MOUTH  DAILY 90 tablet 0  . clopidogrel (PLAVIX) 75 MG tablet Take 1 tablet (75 mg total) by mouth daily. 90 tablet 0  . DULoxetine (CYMBALTA) 30 MG capsule Take 90 mg by mouth daily.     Marland Kitchen EPINEPHrine 0.3 mg/0.3 mL IJ SOAJ injection Inject 0.3 mLs (0.3 mg total) into the muscle once. 1 Device 0  . ergocalciferol (VITAMIN D2) 50000 UNITS capsule Take 50,000 Units by mouth once a week. Take on Saturday    . isosorbide mononitrate (IMDUR) 30 MG 24 hr tablet Take 0.5 tablets (15 mg total) by mouth daily. 90 tablet 3  . metoprolol succinate (TOPROL-XL) 25 MG 24 hr tablet Take 1 tablet (25 mg total) by mouth daily. 90 tablet 3  .  nitroGLYCERIN (NITROSTAT) 0.4 MG SL tablet Place 1 tablet (0.4 mg total) under the tongue every 5 (five) minutes as needed. For chest pain 25 tablet 3  . ondansetron (ZOFRAN ODT) 4 MG disintegrating tablet Take 1 tablet (4 mg total) by mouth every 8 (eight) hours as needed for nausea or vomiting. 20 tablet 0  . oxyCODONE-acetaminophen (PERCOCET/ROXICET) 5-325 MG tablet Take 1-2 tablets by mouth every 6 (six) hours as needed for severe pain. 15 tablet 0  . pantoprazole (PROTONIX) 40 MG tablet Take 1 tablet (40 mg total) by mouth daily. 30 tablet 11  . pregabalin (LYRICA) 75 MG capsule Take 225 mg by mouth 2 (two) times daily.     . ranolazine (RANEXA) 1000 MG SR tablet Take 1 tablet (1,000 mg total) by mouth 2 (two) times daily. 180 tablet 3  . traMADol (ULTRAM) 50 MG tablet Take 50 mg by mouth 3 (three) times daily as needed. For pain     No current facility-administered medications for this visit.     Allergies  Allergen Reactions  . Beef-Derived Products   . Benazepril Cough  . Erythromycin Diarrhea  . Indomethacin Diarrhea  . Other     No mammal protein substances  . Biaxin [Clarithromycin] Rash  . Penicillins Rash    Social History  Social History  . Marital status: Married    Spouse name: N/A  . Number of children: 2  . Years of education: N/A   Occupational History  . [employer]   Social History Main Topics  . Smoking status: Never Smoker  . Smokeless tobacco: Never Used  . Alcohol use 0.0 oz/week     Comment: Occasional - few drinks a month  . Drug use: No  . Sexual activity: Not on file   Other Topics Concern  . Not on file   Social History Narrative  . No narrative on file     Review of Systems: General: negative for chills, fever, night sweats or weight changes.  Cardiovascular: negative for chest pain, dyspnea on exertion, edema, orthopnea, palpitations, paroxysmal nocturnal dyspnea or shortness of breath Dermatological: negative for  rash Respiratory: negative for cough or wheezing Urologic: negative for hematuria Abdominal: negative for nausea, vomiting, diarrhea, bright red blood per rectum, melena, or hematemesis Episode of diverticulitis in Aug- seen in ED Neurologic: negative for visual changes, syncope, or dizziness All other systems reviewed and are otherwise negative except as noted above.    Blood pressure 116/78, pulse 80, height 5\' 4"  (1.626 m), weight 220 lb (99.8 kg).  General appearance: alert, cooperative, no distress and morbidly obese Neck: no carotid bruit and no JVD Lungs: clear to auscultation bilaterally Heart: regular rate and rhythm Extremities: extremities normal, atraumatic, no cyanosis or edema Skin: Skin color, texture, turgor normal. No rashes or lesions Neurologic: Grossly normal   ASSESSMENT AND PLAN:   CAD S/P LAD DES 2012 DES to the LAD July 06, 2011  / Relook catheter July 14, 2011, Sept 2012, negative Myoview April 2014, negative cath Nov 2016. Ranexa added then for exertional chest pain and this helped, no complaints of chest pain  Dyslipidemia, goal LDL below 70 Followed by her PCP- last LDL was 61 in 2016  Obesity (BMI 30-39.9) BMI 37. No change in her wgt since LOV  Essential hypertension Controlled  Allergy with anaphylaxis due to food 2016,  severe allergic reaction to meat protein  Fibromyalgia Followed by Dr Lenna Gilford. Rx'd with Cymbalta, Lyrica, and prn NSAIDs   PLAN  Same Rx. Her PCP checks her labs. F/U Dr Martinique in 6 months.   Kerin Ransom PA-C 11/07/2016 3:44 PM

## 2016-11-07 NOTE — Assessment & Plan Note (Signed)
Followed by Dr Lenna Gilford. Rx'd with Cymbalta, Lyrica, and prn NSAIDs

## 2016-11-15 DIAGNOSIS — L821 Other seborrheic keratosis: Secondary | ICD-10-CM | POA: Diagnosis not present

## 2016-11-29 DIAGNOSIS — M1711 Unilateral primary osteoarthritis, right knee: Secondary | ICD-10-CM | POA: Diagnosis not present

## 2016-12-20 ENCOUNTER — Other Ambulatory Visit: Payer: Self-pay | Admitting: *Deleted

## 2016-12-20 MED ORDER — CLOPIDOGREL BISULFATE 75 MG PO TABS: 75.0000 mg | ORAL_TABLET | Freq: Every day | ORAL | 2 refills | Status: DC

## 2016-12-20 NOTE — Telephone Encounter (Signed)
Rx has been sent to the pharmacy electronically. ° °

## 2017-01-02 DIAGNOSIS — M150 Primary generalized (osteo)arthritis: Secondary | ICD-10-CM | POA: Diagnosis not present

## 2017-01-02 DIAGNOSIS — Z6838 Body mass index (BMI) 38.0-38.9, adult: Secondary | ICD-10-CM | POA: Diagnosis not present

## 2017-01-02 DIAGNOSIS — E669 Obesity, unspecified: Secondary | ICD-10-CM | POA: Diagnosis not present

## 2017-01-02 DIAGNOSIS — M797 Fibromyalgia: Secondary | ICD-10-CM | POA: Diagnosis not present

## 2017-01-02 DIAGNOSIS — Z79899 Other long term (current) drug therapy: Secondary | ICD-10-CM | POA: Diagnosis not present

## 2017-03-18 ENCOUNTER — Other Ambulatory Visit: Payer: Self-pay | Admitting: Cardiology

## 2017-03-18 DIAGNOSIS — I2510 Atherosclerotic heart disease of native coronary artery without angina pectoris: Secondary | ICD-10-CM

## 2017-03-18 MED ORDER — ATORVASTATIN CALCIUM 20 MG PO TABS: 20.0000 mg | ORAL_TABLET | Freq: Every day | ORAL | 0 refills | Status: DC

## 2017-03-18 NOTE — Telephone Encounter (Signed)
Rx(s) sent to pharmacy electronically.  

## 2017-06-17 DIAGNOSIS — S8012XA Contusion of left lower leg, initial encounter: Secondary | ICD-10-CM | POA: Diagnosis not present

## 2017-07-02 DIAGNOSIS — E669 Obesity, unspecified: Secondary | ICD-10-CM | POA: Diagnosis not present

## 2017-07-02 DIAGNOSIS — M797 Fibromyalgia: Secondary | ICD-10-CM | POA: Diagnosis not present

## 2017-07-02 DIAGNOSIS — Z79899 Other long term (current) drug therapy: Secondary | ICD-10-CM | POA: Diagnosis not present

## 2017-07-02 DIAGNOSIS — M150 Primary generalized (osteo)arthritis: Secondary | ICD-10-CM | POA: Diagnosis not present

## 2017-07-02 DIAGNOSIS — Z6837 Body mass index (BMI) 37.0-37.9, adult: Secondary | ICD-10-CM | POA: Diagnosis not present

## 2017-07-08 ENCOUNTER — Other Ambulatory Visit: Payer: Self-pay | Admitting: Cardiology

## 2017-08-21 ENCOUNTER — Other Ambulatory Visit: Payer: Self-pay | Admitting: *Deleted

## 2017-08-21 MED ORDER — CLOPIDOGREL BISULFATE 75 MG PO TABS: 75.0000 mg | ORAL_TABLET | Freq: Every day | ORAL | 0 refills | Status: DC

## 2017-09-16 ENCOUNTER — Other Ambulatory Visit: Payer: Self-pay | Admitting: Cardiology

## 2017-09-16 DIAGNOSIS — I2510 Atherosclerotic heart disease of native coronary artery without angina pectoris: Secondary | ICD-10-CM

## 2017-10-02 DIAGNOSIS — R1032 Left lower quadrant pain: Secondary | ICD-10-CM | POA: Diagnosis not present

## 2017-10-02 DIAGNOSIS — Z23 Encounter for immunization: Secondary | ICD-10-CM | POA: Diagnosis not present

## 2017-10-08 ENCOUNTER — Telehealth: Payer: Self-pay | Admitting: Cardiology

## 2017-10-08 DIAGNOSIS — I10 Essential (primary) hypertension: Secondary | ICD-10-CM

## 2017-10-08 DIAGNOSIS — I2510 Atherosclerotic heart disease of native coronary artery without angina pectoris: Secondary | ICD-10-CM

## 2017-10-08 DIAGNOSIS — E785 Hyperlipidemia, unspecified: Secondary | ICD-10-CM | POA: Insufficient documentation

## 2017-10-08 DIAGNOSIS — M1990 Unspecified osteoarthritis, unspecified site: Secondary | ICD-10-CM | POA: Insufficient documentation

## 2017-10-08 DIAGNOSIS — R7303 Prediabetes: Secondary | ICD-10-CM | POA: Insufficient documentation

## 2017-10-08 NOTE — Telephone Encounter (Signed)
New Message   *STAT* If patient is at the pharmacy, call can be transferred to refill team.   1. Which medications need to be refilled? (please list name of each medication and dose if known) Imdur 30mg , Renexa 1000mg , Norvacs 2.5mg , Metoprolol 25mg   2. Which pharmacy/location (including street and city if local pharmacy) is medication to be sent to? Per pt wants to switch pharmacy to [facility] in [place]   3. Do they need a 30 day or 90 day supply? [place]

## 2017-10-09 ENCOUNTER — Other Ambulatory Visit: Payer: Self-pay

## 2017-10-09 ENCOUNTER — Other Ambulatory Visit: Payer: Self-pay | Admitting: *Deleted

## 2017-10-09 DIAGNOSIS — I2510 Atherosclerotic heart disease of native coronary artery without angina pectoris: Secondary | ICD-10-CM

## 2017-10-09 DIAGNOSIS — I10 Essential (primary) hypertension: Secondary | ICD-10-CM

## 2017-10-09 MED ORDER — AMLODIPINE BESYLATE 2.5 MG PO TABS: 2.5000 mg | ORAL_TABLET | Freq: Every day | ORAL | 0 refills | Status: DC

## 2017-10-09 MED ORDER — ISOSORBIDE MONONITRATE ER 30 MG PO TB24: 15.0000 mg | ORAL_TABLET | Freq: Every day | ORAL | 0 refills | Status: DC

## 2017-10-09 MED ORDER — METOPROLOL SUCCINATE ER 25 MG PO TB24: 25.0000 mg | ORAL_TABLET | Freq: Every day | ORAL | 0 refills | Status: DC

## 2017-10-09 MED ORDER — RANOLAZINE ER 1000 MG PO TB12: 1000.0000 mg | ORAL_TABLET | Freq: Two times a day (BID) | ORAL | 0 refills | Status: DC

## 2017-10-21 NOTE — Progress Notes (Signed)
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 She has a history of CAD and had an LAD DES in 2012. She had a negative cath in Aug 2012, Sept 2012, and a negative Myoview in April 2014. She presented to [facility] on 10/28/15 after she had SSCP "pressure" similar to her pre PCI symptoms. She was admitted and ruled out for an MI. Cath done 10/31/15 showed patent LAD stent without significant residual disease. EF was 55-65%. Her Ranexa was increased.   On follow up today she is doing very well. No chest pain or SOB. Hasn't had to use sl Ntg. Doing water aerobics 2-3 days/week. She is off Lyrica now and tapering Cymbalta. She has chronic fatigue syndrome and fibromyalgia. Gets most relief with Advil but worried about taking it with Plavix. She has lost 9 lbs this year.    Current Outpatient Medications  Medication Sig Dispense Refill  . albuterol (PROVENTIL HFA;VENTOLIN HFA) 108 (90 BASE) MCG/ACT inhaler Inhale 1-2 puffs into the lungs every 6 (six) hours as needed for wheezing or shortness of breath. 1 Inhaler 0  . amLODipine (NORVASC) 2.5 MG tablet Take 1 tablet (2.5 mg total) daily by mouth. 90 tablet 3  . aspirin EC 81 MG tablet Take 81 mg by mouth daily.    Marland Kitchen atorvastatin (LIPITOR) 20 MG tablet Take 1 tablet (20 mg total) daily by mouth. 90 tablet 3  . DULoxetine (CYMBALTA) 30 MG capsule Take 30 mg daily by mouth.     . EPINEPHrine 0.3 mg/0.3 mL IJ SOAJ injection Inject 0.3 mLs (0.3 mg total) into the muscle once. 1 Device 0  . ergocalciferol (VITAMIN D2) 50000 UNITS capsule Take 50,000 Units by mouth once a week. Take on Saturday    . isosorbide mononitrate (IMDUR) 30 MG 24 hr tablet Take 0.5 tablets (15 mg total) daily by mouth. 45 tablet 3  . metoprolol succinate (TOPROL-XL) 25 MG 24 hr tablet Take 1 tablet (25 mg total) daily by mouth. 90 tablet 3  .  nitroGLYCERIN (NITROSTAT) 0.4 MG SL tablet Place 1 tablet (0.4 mg total) under the tongue every 5 (five) minutes as needed. For chest pain 25 tablet 3  . ondansetron (ZOFRAN ODT) 4 MG disintegrating tablet Take 1 tablet (4 mg total) by mouth every 8 (eight) hours as needed for nausea or vomiting. 20 tablet 0  . oxyCODONE-acetaminophen (PERCOCET/ROXICET) 5-325 MG tablet Take 1-2 tablets by mouth every 6 (six) hours as needed for severe pain. 15 tablet 0  . pantoprazole (PROTONIX) 40 MG tablet TAKE 1 TABLET BY MOUTH DAILY 90 tablet 0  . ranolazine (RANEXA) 1000 MG SR tablet Take 1 tablet (1,000 mg total) 2 (two) times daily by mouth. 180 tablet 3  . traMADol (ULTRAM) 50 MG tablet Take 50 mg by mouth 3 (three) times daily as needed. For pain     No current facility-administered medications for this visit.     Allergies  Allergen Reactions  . Beef-Derived Products Anaphylaxis  . Other Anaphylaxis    No mammal protein substances  . Benazepril Cough  . Erythromycin Diarrhea  . Indomethacin Diarrhea  . Biaxin [Clarithromycin] Rash  . Penicillins Rash    Social History   Socioeconomic History  . Marital status: Married    Spouse name: Not on file  . Number of children: 2  . Years  of education: Not on file  . Highest education level: Not on file  Social Needs  . Financial resource strain: Not on file  . Food insecurity - worry: Not on file  . Food insecurity - inability: Not on file  . Transportation needs - medical: Not on file  . Transportation needs - non-medical: Not on file  Occupational History  . Occupation: URGENT CARE    Employer: [employer]  Tobacco Use  . Smoking status: Never Smoker  . Smokeless tobacco: Never Used  Substance and Sexual Activity  . Alcohol use: Yes    Alcohol/week: 0.0 oz    Comment: Occasional - few drinks a month  . Drug use: No  . Sexual activity: Not on file  Other Topics Concern  . Not on file  Social History Narrative  . Not on file      Review of Systems: As noted in HPI.  All other systems reviewed and are otherwise negative except as noted above.    Blood pressure 140/78, pulse 70, height 5\' 4"  (1.626 m), weight 211 lb (95.7 kg).  GENERAL:  Well appearing overweight WF HEENT:  PERRL, EOMI, sclera are clear. Oropharynx is clear. NECK:  No jugular venous distention, carotid upstroke brisk and symmetric, no bruits, no thyromegaly or adenopathy LUNGS:  Clear to auscultation bilaterally CHEST:  Unremarkable HEART:  RRR,  PMI not displaced or sustained,S1 and S2 within normal limits, no S3, no S4: no clicks, no rubs, no murmurs ABD:  Soft, nontender. BS +, no masses or bruits. No hepatomegaly, no splenomegaly EXT:  2 + pulses throughout, no edema, no cyanosis no clubbing SKIN:  Warm and dry.  No rashes NEURO:  Alert and oriented x 3. Cranial nerves II through XII intact. PSYCH:  Cognitively intact  Laboratory data:  Dated 10/30/16: cholesterol 177, triglycerides 132, HDL 51, LDL 100. A1c 5.8%. Chemistries normal.  Ecg today shows NSR rate 70. Incomplete RBBB. I have personally reviewed and interpreted this study.  ASSESSMENT AND PLAN:  1. CAD s/p stenting of the LAD in 2012 with Resolute integrity stent. Repeat cath in November 2016 showed patent stent with nonobstructive disease. She does not need long term DAPT so we will stop Plavix now. Continue exercise program.  2. HTN- controlled.  3. Dyslipidemia- scheduled for follow up lab work with primary care in 2 weeks.  4. Obesity. Encouraged continued weight loss.  PLAN  Follow up in one year.  [name] MD,FACC 10/22/2017 10:16 AM

## 2017-10-22 ENCOUNTER — Ambulatory Visit (INDEPENDENT_AMBULATORY_CARE_PROVIDER_SITE_OTHER): Payer: Medicare Other | Admitting: Cardiology

## 2017-10-22 ENCOUNTER — Encounter: Payer: Self-pay | Admitting: Cardiology

## 2017-10-22 VITALS — BP 140/78 | HR 70 | Ht 64.0 in | Wt 211.0 lb

## 2017-10-22 DIAGNOSIS — Z9861 Coronary angioplasty status: Secondary | ICD-10-CM

## 2017-10-22 DIAGNOSIS — I2510 Atherosclerotic heart disease of native coronary artery without angina pectoris: Secondary | ICD-10-CM

## 2017-10-22 DIAGNOSIS — I10 Essential (primary) hypertension: Secondary | ICD-10-CM

## 2017-10-22 DIAGNOSIS — E785 Hyperlipidemia, unspecified: Secondary | ICD-10-CM

## 2017-10-22 MED ORDER — ISOSORBIDE MONONITRATE ER 30 MG PO TB24: 15.0000 mg | ORAL_TABLET | Freq: Every day | ORAL | 3 refills | Status: DC

## 2017-10-22 MED ORDER — METOPROLOL SUCCINATE ER 25 MG PO TB24: 25.0000 mg | ORAL_TABLET | Freq: Every day | ORAL | 3 refills | Status: DC

## 2017-10-22 MED ORDER — RANOLAZINE ER 1000 MG PO TB12: 1000.0000 mg | ORAL_TABLET | Freq: Two times a day (BID) | ORAL | 3 refills | Status: DC

## 2017-10-22 MED ORDER — ATORVASTATIN CALCIUM 20 MG PO TABS: 20.0000 mg | ORAL_TABLET | Freq: Every day | ORAL | 3 refills | Status: DC

## 2017-10-22 MED ORDER — AMLODIPINE BESYLATE 2.5 MG PO TABS: 2.5000 mg | ORAL_TABLET | Freq: Every day | ORAL | 3 refills | Status: DC

## 2017-10-22 NOTE — Patient Instructions (Addendum)
Stop taking Plavix  Continue your other therapy  I will see you in one year. 

## 2017-10-24 ENCOUNTER — Other Ambulatory Visit: Payer: Self-pay | Admitting: *Deleted

## 2017-10-24 DIAGNOSIS — I2510 Atherosclerotic heart disease of native coronary artery without angina pectoris: Secondary | ICD-10-CM

## 2017-10-24 MED ORDER — PANTOPRAZOLE SODIUM 40 MG PO TBEC: 40.0000 mg | DELAYED_RELEASE_TABLET | Freq: Every day | ORAL | 3 refills | Status: DC

## 2017-10-24 MED ORDER — ISOSORBIDE MONONITRATE ER 30 MG PO TB24: 15.0000 mg | ORAL_TABLET | Freq: Every day | ORAL | 3 refills | Status: DC

## 2017-11-04 DIAGNOSIS — E756 Lipid storage disorder, unspecified: Secondary | ICD-10-CM | POA: Diagnosis not present

## 2017-11-04 DIAGNOSIS — Z0000 Encounter for general adult medical examination without abnormal findings: Secondary | ICD-10-CM | POA: Diagnosis not present

## 2017-11-04 DIAGNOSIS — E118 Type 2 diabetes mellitus with unspecified complications: Secondary | ICD-10-CM | POA: Diagnosis not present

## 2017-11-04 DIAGNOSIS — Z23 Encounter for immunization: Secondary | ICD-10-CM | POA: Diagnosis not present

## 2017-11-04 DIAGNOSIS — M797 Fibromyalgia: Secondary | ICD-10-CM | POA: Diagnosis not present

## 2017-11-04 DIAGNOSIS — I2510 Atherosclerotic heart disease of native coronary artery without angina pectoris: Secondary | ICD-10-CM | POA: Diagnosis not present

## 2017-11-04 DIAGNOSIS — M8580 Other specified disorders of bone density and structure, unspecified site: Secondary | ICD-10-CM | POA: Diagnosis not present

## 2017-11-04 DIAGNOSIS — F322 Major depressive disorder, single episode, severe without psychotic features: Secondary | ICD-10-CM | POA: Diagnosis not present

## 2017-11-04 DIAGNOSIS — E7800 Pure hypercholesterolemia, unspecified: Secondary | ICD-10-CM | POA: Diagnosis not present

## 2017-11-04 DIAGNOSIS — M1990 Unspecified osteoarthritis, unspecified site: Secondary | ICD-10-CM | POA: Diagnosis not present

## 2017-11-04 DIAGNOSIS — I119 Hypertensive heart disease without heart failure: Secondary | ICD-10-CM | POA: Diagnosis not present

## 2017-11-05 ENCOUNTER — Other Ambulatory Visit: Payer: Self-pay | Admitting: Family Medicine

## 2017-11-05 DIAGNOSIS — Z1231 Encounter for screening mammogram for malignant neoplasm of breast: Secondary | ICD-10-CM

## 2017-11-12 ENCOUNTER — Other Ambulatory Visit: Payer: Self-pay | Admitting: Family Medicine

## 2017-11-12 DIAGNOSIS — R1032 Left lower quadrant pain: Secondary | ICD-10-CM | POA: Diagnosis not present

## 2017-11-13 ENCOUNTER — Ambulatory Visit
Admission: RE | Admit: 2017-11-13 | Discharge: 2017-11-13 | Disposition: A | Discharge status: Home / Self Care | Payer: Medicare Other | Source: Ambulatory Visit | Attending: Family Medicine | Admitting: Family Medicine

## 2017-11-13 DIAGNOSIS — R1032 Left lower quadrant pain: Secondary | ICD-10-CM

## 2017-11-13 DIAGNOSIS — K5732 Diverticulitis of large intestine without perforation or abscess without bleeding: Secondary | ICD-10-CM | POA: Diagnosis not present

## 2017-11-13 MED ORDER — IOPAMIDOL (ISOVUE-300) INJECTION 61%
100.0000 mL | Freq: Once | INTRAVENOUS | Status: AC | PRN
  Administered 2017-11-13: 100 mL via INTRAVENOUS

## 2017-11-26 ENCOUNTER — Other Ambulatory Visit: Payer: Self-pay | Admitting: Cardiology

## 2017-11-26 NOTE — Telephone Encounter (Signed)
REFILL 

## 2017-12-04 ENCOUNTER — Ambulatory Visit
Admission: RE | Admit: 2017-12-04 | Discharge: 2017-12-04 | Disposition: A | Discharge status: Home / Self Care | Payer: Medicare Other | Source: Ambulatory Visit | Attending: Family Medicine | Admitting: Family Medicine

## 2017-12-04 DIAGNOSIS — Z1231 Encounter for screening mammogram for malignant neoplasm of breast: Secondary | ICD-10-CM

## 2018-01-02 DIAGNOSIS — M797 Fibromyalgia: Secondary | ICD-10-CM | POA: Diagnosis not present

## 2018-01-02 DIAGNOSIS — M150 Primary generalized (osteo)arthritis: Secondary | ICD-10-CM | POA: Diagnosis not present

## 2018-01-02 DIAGNOSIS — Z6836 Body mass index (BMI) 36.0-36.9, adult: Secondary | ICD-10-CM | POA: Diagnosis not present

## 2018-01-02 DIAGNOSIS — Z79899 Other long term (current) drug therapy: Secondary | ICD-10-CM | POA: Diagnosis not present

## 2018-01-02 DIAGNOSIS — E669 Obesity, unspecified: Secondary | ICD-10-CM | POA: Diagnosis not present

## 2018-01-08 DIAGNOSIS — M8588 Other specified disorders of bone density and structure, other site: Secondary | ICD-10-CM | POA: Diagnosis not present

## 2018-01-21 DIAGNOSIS — E119 Type 2 diabetes mellitus without complications: Secondary | ICD-10-CM | POA: Diagnosis not present

## 2018-05-06 DIAGNOSIS — I2510 Atherosclerotic heart disease of native coronary artery without angina pectoris: Secondary | ICD-10-CM | POA: Diagnosis not present

## 2018-05-06 DIAGNOSIS — E7800 Pure hypercholesterolemia, unspecified: Secondary | ICD-10-CM | POA: Diagnosis not present

## 2018-05-06 DIAGNOSIS — I119 Hypertensive heart disease without heart failure: Secondary | ICD-10-CM | POA: Diagnosis not present

## 2018-05-06 DIAGNOSIS — E118 Type 2 diabetes mellitus with unspecified complications: Secondary | ICD-10-CM | POA: Diagnosis not present

## 2018-07-02 DIAGNOSIS — M150 Primary generalized (osteo)arthritis: Secondary | ICD-10-CM | POA: Diagnosis not present

## 2018-07-02 DIAGNOSIS — E669 Obesity, unspecified: Secondary | ICD-10-CM | POA: Diagnosis not present

## 2018-07-02 DIAGNOSIS — Z79899 Other long term (current) drug therapy: Secondary | ICD-10-CM | POA: Diagnosis not present

## 2018-07-02 DIAGNOSIS — Z6836 Body mass index (BMI) 36.0-36.9, adult: Secondary | ICD-10-CM | POA: Diagnosis not present

## 2018-07-02 DIAGNOSIS — M797 Fibromyalgia: Secondary | ICD-10-CM | POA: Diagnosis not present

## 2018-10-09 DIAGNOSIS — Z23 Encounter for immunization: Secondary | ICD-10-CM | POA: Diagnosis not present

## 2018-10-15 ENCOUNTER — Other Ambulatory Visit: Payer: Self-pay | Admitting: Gastroenterology

## 2018-10-15 DIAGNOSIS — M797 Fibromyalgia: Secondary | ICD-10-CM | POA: Diagnosis not present

## 2018-10-15 DIAGNOSIS — R1032 Left lower quadrant pain: Secondary | ICD-10-CM | POA: Diagnosis not present

## 2018-10-15 DIAGNOSIS — R1012 Left upper quadrant pain: Secondary | ICD-10-CM | POA: Diagnosis not present

## 2018-10-15 DIAGNOSIS — Z8719 Personal history of other diseases of the digestive system: Secondary | ICD-10-CM | POA: Diagnosis not present

## 2018-10-15 DIAGNOSIS — E756 Lipid storage disorder, unspecified: Secondary | ICD-10-CM | POA: Diagnosis not present

## 2018-10-15 DIAGNOSIS — E118 Type 2 diabetes mellitus with unspecified complications: Secondary | ICD-10-CM | POA: Diagnosis not present

## 2018-10-21 ENCOUNTER — Ambulatory Visit
Admission: RE | Admit: 2018-10-21 | Discharge: 2018-10-21 | Disposition: A | Discharge status: Home / Self Care | Payer: Medicare Other | Source: Ambulatory Visit | Attending: Gastroenterology | Admitting: Gastroenterology

## 2018-10-21 DIAGNOSIS — R1032 Left lower quadrant pain: Secondary | ICD-10-CM

## 2018-10-21 DIAGNOSIS — K5730 Diverticulosis of large intestine without perforation or abscess without bleeding: Secondary | ICD-10-CM | POA: Diagnosis not present

## 2018-10-21 MED ORDER — IOPAMIDOL (ISOVUE-300) INJECTION 61%
100.0000 mL | Freq: Once | INTRAVENOUS | Status: AC | PRN
  Administered 2018-10-21: 100 mL via INTRAVENOUS

## 2018-10-30 DIAGNOSIS — R935 Abnormal findings on diagnostic imaging of other abdominal regions, including retroperitoneum: Secondary | ICD-10-CM | POA: Diagnosis not present

## 2018-10-30 DIAGNOSIS — R1032 Left lower quadrant pain: Secondary | ICD-10-CM | POA: Diagnosis not present

## 2018-10-31 NOTE — Progress Notes (Signed)
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 She has a history of CAD and had an LAD DES in 2012. She had a negative cath in Aug 2012, Sept 2012, and a negative Myoview in April 2014. She presented to [facility] on 10/28/15 after she had SSCP "pressure" similar to her pre PCI symptoms. She was admitted and ruled out for an MI. Cath done 10/31/15 showed patent LAD stent without significant residual disease. EF was 55-65%. Her Ranexa was increased. Plavix was discontinued last year.  On follow up today she is doing well from a cardiac standpoint. No chest pain or SOB. Hasn't had to use sl Ntg. The only angina she had was when she reduced her Ranexa dose for a couple of days. She has recently had more problems with diverticulitis. plans to have a colonoscopy with Dr Oletta Lamas next week. this has limited her exercise. She has chronic fatigue syndrome and fibromyalgia.    Current Outpatient Medications  Medication Sig Dispense Refill  . albuterol (PROVENTIL HFA;VENTOLIN HFA) 108 (90 BASE) MCG/ACT inhaler Inhale 1-2 puffs into the lungs every 6 (six) hours as needed for wheezing or shortness of breath. 1 Inhaler 0  . amLODipine (NORVASC) 2.5 MG tablet Take 1 tablet (2.5 mg total) daily by mouth. 90 tablet 3  . aspirin EC 81 MG tablet Take 81 mg by mouth daily.    . DULoxetine (CYMBALTA) 60 MG capsule TK 1 C PO QD  1  . EPINEPHrine 0.3 mg/0.3 mL IJ SOAJ injection Inject 0.3 mLs (0.3 mg total) into the muscle once. 1 Device 0  . ergocalciferol (VITAMIN D2) 50000 UNITS capsule Take 50,000 Units by mouth once a week. Take on Saturday    . isosorbide mononitrate (IMDUR) 30 MG 24 hr tablet Take 0.5 tablets (15 mg total) daily by mouth. 45 tablet 3  . metoprolol succinate (TOPROL-XL) 25 MG 24 hr tablet Take 1 tablet (25 mg total) daily by mouth. 90 tablet 3  . nitroGLYCERIN  (NITROSTAT) 0.4 MG SL tablet Place 1 tablet (0.4 mg total) under the tongue every 5 (five) minutes as needed. For chest pain 25 tablet 3  . ondansetron (ZOFRAN ODT) 4 MG disintegrating tablet Take 1 tablet (4 mg total) by mouth every 8 (eight) hours as needed for nausea or vomiting. 20 tablet 0  . oxyCODONE-acetaminophen (PERCOCET/ROXICET) 5-325 MG tablet Take 1-2 tablets by mouth every 6 (six) hours as needed for severe pain. 15 tablet 0  . pantoprazole (PROTONIX) 40 MG tablet Take 1 tablet (40 mg total) daily by mouth. 90 tablet 3  . ranolazine (RANEXA) 1000 MG SR tablet Take 1 tablet (1,000 mg total) 2 (two) times daily by mouth. 180 tablet 3  . traMADol (ULTRAM) 50 MG tablet Take 50 mg by mouth 3 (three) times daily as needed. For pain    . atorvastatin (LIPITOR) 40 MG tablet Take 1 tablet (40 mg total) by mouth daily. 90 tablet 3  . pregabalin (LYRICA) 75 MG capsule TK 1 C PO BID  4   No current facility-administered medications for this visit.     Allergies  Allergen Reactions  . Beef-Derived Products Anaphylaxis  . Other Anaphylaxis    No mammal protein substances  . Benazepril Cough  . Erythromycin Diarrhea  . Indomethacin Diarrhea  . Biaxin [Clarithromycin] Rash  . Penicillins Rash  Social History   Socioeconomic History  . Marital status: Married    Spouse name: Not on file  . Number of children: 2  . Years of education: Not on file  . Highest education level: Not on file  Occupational History  . Occupation: URGENT CARE    Employer: [employer]  Social Needs  . Financial resource strain: Not on file  . Food insecurity:    Worry: Not on file    Inability: Not on file  . Transportation needs:    Medical: Not on file    Non-medical: Not on file  Tobacco Use  . Smoking status: Never Smoker  . Smokeless tobacco: Never Used  Substance and Sexual Activity  . Alcohol use: Yes    Alcohol/week: 0.0 standard drinks    Comment: Occasional - few drinks a month  . Drug  use: No  . Sexual activity: Not on file  Lifestyle  . Physical activity:    Days per week: Not on file    Minutes per session: Not on file  . Stress: Not on file  Relationships  . Social connections:    Talks on phone: Not on file    Gets together: Not on file    Attends religious service: Not on file    Active member of club or organization: Not on file    Attends meetings of clubs or organizations: Not on file    Relationship status: Not on file  . Intimate partner violence:    Fear of current or ex partner: Not on file    Emotionally abused: Not on file    Physically abused: Not on file    Forced sexual activity: Not on file  Other Topics Concern  . Not on file  Social History Narrative  . Not on file     Review of Systems: As noted in HPI.  All other systems reviewed and are otherwise negative except as noted above.    Blood pressure 122/66, pulse 79, height 5\' 4"  (1.626 m), weight 213 lb (96.6 kg), SpO2 96 %.  GENERAL:  Well appearing WF in NAD HEENT:  PERRL, EOMI, sclera are clear. Oropharynx is clear. NECK:  No jugular venous distention, carotid upstroke brisk and symmetric, no bruits, no thyromegaly or adenopathy LUNGS:  Clear to auscultation bilaterally CHEST:  Unremarkable HEART:  RRR,  PMI not displaced or sustained,S1 and S2 within normal limits, no S3, no S4: no clicks, no rubs, no murmurs ABD:  Soft, nontender. BS +, no masses or bruits. No hepatomegaly, no splenomegaly EXT:  2 + pulses throughout, no edema, no cyanosis no clubbing SKIN:  Warm and dry.  No rashes NEURO:  Alert and oriented x 3. Cranial nerves II through XII intact. PSYCH:  Cognitively intact   Laboratory data:  Dated 10/30/16: cholesterol 177, triglycerides 132, HDL 51, LDL 100. A1c 5.8%. Chemistries normal. Dated 05/06/18: cholesterol  183, triglycerides 202, HDL 42, LDL 100. A1c 5.9% Dated 10/15/18: normal CBC and chemistries.   Ecg today shows NSR rate 79. Incomplete RBBB. I have  personally reviewed and interpreted this study.  ASSESSMENT AND PLAN:  1. CAD s/p stenting of the LAD in 2012 with Resolute integrity stent. Repeat cath in November 2016 showed patent stent with nonobstructive disease. She has stable angina class 1-2. Continue current medication.  2. HTN- controlled.  3. Dyslipidemia- goal LDL < 70. Last LDL 100. Will increase lipitor to 40 mg daily repeat lipids and LFTs in 3 months. 4. Obesity.  Encouraged continued weight loss.  PLAN  Follow up in one year.   Martinique MD,FACC 11/04/2018 2:15 PM

## 2018-11-04 ENCOUNTER — Encounter: Payer: Self-pay | Admitting: Cardiology

## 2018-11-04 ENCOUNTER — Ambulatory Visit (INDEPENDENT_AMBULATORY_CARE_PROVIDER_SITE_OTHER): Payer: Medicare Other | Admitting: Cardiology

## 2018-11-04 VITALS — BP 122/66 | HR 79 | Ht 64.0 in | Wt 213.0 lb

## 2018-11-04 DIAGNOSIS — I10 Essential (primary) hypertension: Secondary | ICD-10-CM

## 2018-11-04 DIAGNOSIS — E785 Hyperlipidemia, unspecified: Secondary | ICD-10-CM | POA: Diagnosis not present

## 2018-11-04 DIAGNOSIS — I2510 Atherosclerotic heart disease of native coronary artery without angina pectoris: Secondary | ICD-10-CM | POA: Diagnosis not present

## 2018-11-04 MED ORDER — ATORVASTATIN CALCIUM 40 MG PO TABS: 40.0000 mg | ORAL_TABLET | Freq: Every day | ORAL | 3 refills | Status: DC

## 2018-11-04 NOTE — Patient Instructions (Signed)
Increase lipitor to 40 mg daily  Continue your other therapy  Follow up in one year.

## 2018-11-05 ENCOUNTER — Other Ambulatory Visit: Payer: Self-pay

## 2018-11-05 MED ORDER — PANTOPRAZOLE SODIUM 40 MG PO TBEC: 40.0000 mg | DELAYED_RELEASE_TABLET | Freq: Every day | ORAL | 3 refills | Status: DC

## 2018-11-07 ENCOUNTER — Other Ambulatory Visit: Payer: Self-pay | Admitting: Cardiology

## 2018-11-07 DIAGNOSIS — I2510 Atherosclerotic heart disease of native coronary artery without angina pectoris: Secondary | ICD-10-CM

## 2018-11-11 ENCOUNTER — Other Ambulatory Visit: Payer: Self-pay

## 2018-11-11 DIAGNOSIS — K5730 Diverticulosis of large intestine without perforation or abscess without bleeding: Secondary | ICD-10-CM | POA: Diagnosis not present

## 2018-11-11 DIAGNOSIS — I10 Essential (primary) hypertension: Secondary | ICD-10-CM

## 2018-11-11 DIAGNOSIS — M8580 Other specified disorders of bone density and structure, unspecified site: Secondary | ICD-10-CM | POA: Diagnosis not present

## 2018-11-11 DIAGNOSIS — M797 Fibromyalgia: Secondary | ICD-10-CM | POA: Diagnosis not present

## 2018-11-11 DIAGNOSIS — M1990 Unspecified osteoarthritis, unspecified site: Secondary | ICD-10-CM | POA: Diagnosis not present

## 2018-11-11 DIAGNOSIS — I2510 Atherosclerotic heart disease of native coronary artery without angina pectoris: Secondary | ICD-10-CM | POA: Diagnosis not present

## 2018-11-11 DIAGNOSIS — E1169 Type 2 diabetes mellitus with other specified complication: Secondary | ICD-10-CM | POA: Diagnosis not present

## 2018-11-11 DIAGNOSIS — I119 Hypertensive heart disease without heart failure: Secondary | ICD-10-CM | POA: Diagnosis not present

## 2018-11-11 DIAGNOSIS — F322 Major depressive disorder, single episode, severe without psychotic features: Secondary | ICD-10-CM | POA: Diagnosis not present

## 2018-11-11 DIAGNOSIS — Z23 Encounter for immunization: Secondary | ICD-10-CM | POA: Diagnosis not present

## 2018-11-11 DIAGNOSIS — Z0000 Encounter for general adult medical examination without abnormal findings: Secondary | ICD-10-CM | POA: Diagnosis not present

## 2018-11-11 DIAGNOSIS — E756 Lipid storage disorder, unspecified: Secondary | ICD-10-CM | POA: Diagnosis not present

## 2018-11-11 DIAGNOSIS — E7800 Pure hypercholesterolemia, unspecified: Secondary | ICD-10-CM | POA: Diagnosis not present

## 2018-11-11 MED ORDER — METOPROLOL SUCCINATE ER 25 MG PO TB24: 25.0000 mg | ORAL_TABLET | Freq: Every day | ORAL | 3 refills | Status: DC

## 2018-11-11 MED ORDER — AMLODIPINE BESYLATE 2.5 MG PO TABS: 2.5000 mg | ORAL_TABLET | Freq: Every day | ORAL | 3 refills | Status: DC

## 2018-11-14 ENCOUNTER — Other Ambulatory Visit: Payer: Self-pay

## 2018-11-14 DIAGNOSIS — I2510 Atherosclerotic heart disease of native coronary artery without angina pectoris: Secondary | ICD-10-CM

## 2018-11-14 MED ORDER — RANOLAZINE ER 1000 MG PO TB12: 1000.0000 mg | ORAL_TABLET | Freq: Two times a day (BID) | ORAL | 3 refills | Status: DC

## 2018-11-18 ENCOUNTER — Other Ambulatory Visit: Payer: Self-pay | Admitting: Cardiology

## 2018-11-18 DIAGNOSIS — I2510 Atherosclerotic heart disease of native coronary artery without angina pectoris: Secondary | ICD-10-CM

## 2018-11-25 DIAGNOSIS — R933 Abnormal findings on diagnostic imaging of other parts of digestive tract: Secondary | ICD-10-CM | POA: Diagnosis not present

## 2018-11-25 DIAGNOSIS — K6389 Other specified diseases of intestine: Secondary | ICD-10-CM | POA: Diagnosis not present

## 2018-11-25 DIAGNOSIS — D124 Benign neoplasm of descending colon: Secondary | ICD-10-CM | POA: Diagnosis not present

## 2018-11-25 DIAGNOSIS — K5730 Diverticulosis of large intestine without perforation or abscess without bleeding: Secondary | ICD-10-CM | POA: Diagnosis not present

## 2018-11-28 DIAGNOSIS — D124 Benign neoplasm of descending colon: Secondary | ICD-10-CM | POA: Diagnosis not present

## 2018-12-31 DIAGNOSIS — Z79899 Other long term (current) drug therapy: Secondary | ICD-10-CM | POA: Diagnosis not present

## 2018-12-31 DIAGNOSIS — M2550 Pain in unspecified joint: Secondary | ICD-10-CM | POA: Diagnosis not present

## 2018-12-31 DIAGNOSIS — F329 Major depressive disorder, single episode, unspecified: Secondary | ICD-10-CM | POA: Diagnosis not present

## 2018-12-31 DIAGNOSIS — M150 Primary generalized (osteo)arthritis: Secondary | ICD-10-CM | POA: Diagnosis not present

## 2018-12-31 DIAGNOSIS — E669 Obesity, unspecified: Secondary | ICD-10-CM | POA: Diagnosis not present

## 2018-12-31 DIAGNOSIS — Z6838 Body mass index (BMI) 38.0-38.9, adult: Secondary | ICD-10-CM | POA: Diagnosis not present

## 2018-12-31 DIAGNOSIS — M797 Fibromyalgia: Secondary | ICD-10-CM | POA: Diagnosis not present

## 2019-01-15 ENCOUNTER — Other Ambulatory Visit: Payer: Self-pay | Admitting: Family Medicine

## 2019-01-15 DIAGNOSIS — Z1231 Encounter for screening mammogram for malignant neoplasm of breast: Secondary | ICD-10-CM

## 2019-01-16 ENCOUNTER — Ambulatory Visit
Admission: RE | Admit: 2019-01-16 | Discharge: 2019-01-16 | Disposition: A | Discharge status: Home / Self Care | Payer: Medicare Other | Source: Ambulatory Visit | Attending: Family Medicine | Admitting: Family Medicine

## 2019-01-16 DIAGNOSIS — Z1231 Encounter for screening mammogram for malignant neoplasm of breast: Secondary | ICD-10-CM | POA: Diagnosis not present

## 2019-01-21 DIAGNOSIS — E119 Type 2 diabetes mellitus without complications: Secondary | ICD-10-CM | POA: Diagnosis not present

## 2019-02-03 DIAGNOSIS — K5730 Diverticulosis of large intestine without perforation or abscess without bleeding: Secondary | ICD-10-CM | POA: Diagnosis not present

## 2019-02-03 DIAGNOSIS — D124 Benign neoplasm of descending colon: Secondary | ICD-10-CM | POA: Diagnosis not present

## 2019-04-21 DIAGNOSIS — H2513 Age-related nuclear cataract, bilateral: Secondary | ICD-10-CM | POA: Diagnosis not present

## 2019-04-21 DIAGNOSIS — H25013 Cortical age-related cataract, bilateral: Secondary | ICD-10-CM | POA: Diagnosis not present

## 2019-04-21 DIAGNOSIS — H35371 Puckering of macula, right eye: Secondary | ICD-10-CM | POA: Diagnosis not present

## 2019-04-21 DIAGNOSIS — H2511 Age-related nuclear cataract, right eye: Secondary | ICD-10-CM | POA: Diagnosis not present

## 2019-04-21 DIAGNOSIS — H25043 Posterior subcapsular polar age-related cataract, bilateral: Secondary | ICD-10-CM | POA: Diagnosis not present

## 2019-04-21 DIAGNOSIS — H18413 Arcus senilis, bilateral: Secondary | ICD-10-CM | POA: Diagnosis not present

## 2019-04-29 ENCOUNTER — Telehealth: Payer: Self-pay | Admitting: Cardiology

## 2019-04-29 NOTE — Telephone Encounter (Signed)
   [facility] Pre-operative Risk Assessment    Request for surgical clearance:  1. What type of surgery is being performed? Cataract Extraction w/Intraocular Lens Implantation of the Right Eye followed by the Left Eye   2. When is this surgery scheduled? 05/04/19 and 05/18/19   3. What type of clearance is required (medical clearance vs. Pharmacy clearance to hold med vs. Both)? Medical   4. Are there any medications that need to be held prior to surgery and how long? None   5. Practice name and name of physician performing surgery? [facility]/ Dr. Darleen Crocker   6. What is your office phone number [phone]    7.   What is your office fax number [phone]  8.   Anesthesia type (None, local, MAC, general) ? Topical with IV medication   Courtney Steele 04/29/2019, 4:45 PM  _________________________________________________________________   (provider comments below)

## 2019-04-29 NOTE — Telephone Encounter (Signed)
   Primary Cardiologist: Peter Martinique, MD  Chart reviewed as part of pre-operative protocol coverage. Cataract extractions are recognized in guidelines as low risk surgeries that do not typically require specific preoperative testing or holding of blood thinner therapy. Therefore, given past medical history and time since last visit, based on ACC/AHA guidelines, Courtney Steele would be at acceptable risk for the planned procedure without further cardiovascular testing.   I will route this recommendation to the requesting party via Epic fax function and remove from pre-op pool.  Please call with questions.  [place], PA 04/29/2019, 4:54 PM

## 2019-05-04 DIAGNOSIS — H2511 Age-related nuclear cataract, right eye: Secondary | ICD-10-CM | POA: Diagnosis not present

## 2019-05-05 DIAGNOSIS — H2512 Age-related nuclear cataract, left eye: Secondary | ICD-10-CM | POA: Diagnosis not present

## 2019-05-13 DIAGNOSIS — E1169 Type 2 diabetes mellitus with other specified complication: Secondary | ICD-10-CM | POA: Diagnosis not present

## 2019-05-13 DIAGNOSIS — Z1159 Encounter for screening for other viral diseases: Secondary | ICD-10-CM | POA: Diagnosis not present

## 2019-05-18 DIAGNOSIS — H2512 Age-related nuclear cataract, left eye: Secondary | ICD-10-CM | POA: Diagnosis not present

## 2019-07-01 DIAGNOSIS — Z79899 Other long term (current) drug therapy: Secondary | ICD-10-CM | POA: Diagnosis not present

## 2019-07-01 DIAGNOSIS — M150 Primary generalized (osteo)arthritis: Secondary | ICD-10-CM | POA: Diagnosis not present

## 2019-07-01 DIAGNOSIS — M797 Fibromyalgia: Secondary | ICD-10-CM | POA: Diagnosis not present

## 2019-08-01 ENCOUNTER — Other Ambulatory Visit: Payer: Self-pay | Admitting: Cardiology

## 2019-08-28 DIAGNOSIS — Z23 Encounter for immunization: Secondary | ICD-10-CM | POA: Diagnosis not present

## 2019-10-25 ENCOUNTER — Other Ambulatory Visit: Payer: Self-pay | Admitting: Cardiology

## 2019-10-25 DIAGNOSIS — I10 Essential (primary) hypertension: Secondary | ICD-10-CM

## 2019-10-27 ENCOUNTER — Other Ambulatory Visit: Payer: Self-pay | Admitting: Cardiology

## 2019-10-27 DIAGNOSIS — I10 Essential (primary) hypertension: Secondary | ICD-10-CM

## 2019-10-30 ENCOUNTER — Other Ambulatory Visit: Payer: Self-pay

## 2019-10-30 DIAGNOSIS — I10 Essential (primary) hypertension: Secondary | ICD-10-CM

## 2019-10-30 MED ORDER — AMLODIPINE BESYLATE 2.5 MG PO TABS: 2.5000 mg | ORAL_TABLET | Freq: Every day | ORAL | 0 refills | Status: DC

## 2019-11-01 ENCOUNTER — Other Ambulatory Visit: Payer: Self-pay | Admitting: Cardiology

## 2019-11-02 ENCOUNTER — Other Ambulatory Visit: Payer: Self-pay | Admitting: Cardiology

## 2019-11-17 NOTE — Progress Notes (Signed)
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 She has a history of CAD and had an LAD DES in 2012. She had a negative cath in Aug 2012, Sept 2012, and a negative Myoview in April 2014. She presented to [facility] on 10/28/15 after she had SSCP "pressure" similar to her pre PCI symptoms. She was admitted and ruled out for an MI. Cath done 10/31/15 showed patent LAD stent without significant residual disease. EF was 55-65%. Her Ranexa was increased. Plavix was discontinued last year.  On follow up today she is doing well from a cardiac standpoint. No chest pain or SOB. Hasn't had to use sl Ntg.  She has chronic fatigue syndrome and fibromyalgia. She did have colonoscopy last year with a benign polyp.   Current Outpatient Medications  Medication Sig Dispense Refill  . albuterol (PROVENTIL HFA;VENTOLIN HFA) 108 (90 BASE) MCG/ACT inhaler Inhale 1-2 puffs into the lungs every 6 (six) hours as needed for wheezing or shortness of breath. 1 Inhaler 0  . amLODipine (NORVASC) 2.5 MG tablet Take 1 tablet (2.5 mg total) by mouth daily. 90 tablet 3  . aspirin EC 81 MG tablet Take 81 mg by mouth daily.    . DULoxetine (CYMBALTA) 60 MG capsule TK 1 C PO QD  1  . EPINEPHrine 0.3 mg/0.3 mL IJ SOAJ injection Inject 0.3 mLs (0.3 mg total) into the muscle once. 1 Device 0  . ergocalciferol (VITAMIN D2) 50000 UNITS capsule Take 50,000 Units by mouth once a week. Take on Saturday    . isosorbide mononitrate (IMDUR) 30 MG 24 hr tablet TAKE 1/2 TABLET BY MOUTH DAILY 45 tablet 0  . isosorbide mononitrate (IMDUR) 30 MG 24 hr tablet Take 0.5 tablets (15 mg total) by mouth daily. 45 tablet 3  . meloxicam (MOBIC) 15 MG tablet     . metoprolol succinate (TOPROL-XL) 25 MG 24 hr tablet TAKE 1 TABLET BY MOUTH DAILY. 90 tablet 0  . nitroGLYCERIN (NITROSTAT) 0.4 MG SL tablet Place 1 tablet (0.4 mg  total) under the tongue every 5 (five) minutes as needed. For chest pain 25 tablet 3  . ondansetron (ZOFRAN ODT) 4 MG disintegrating tablet Take 1 tablet (4 mg total) by mouth every 8 (eight) hours as needed for nausea or vomiting. 20 tablet 0  . oxyCODONE-acetaminophen (PERCOCET/ROXICET) 5-325 MG tablet Take 1-2 tablets by mouth every 6 (six) hours as needed for severe pain. 15 tablet 0  . pantoprazole (PROTONIX) 40 MG tablet TAKE 1 TABLET(40 MG) BY MOUTH DAILY 90 tablet 0  . pregabalin (LYRICA) 75 MG capsule TK 1 C PO BID  4  . ranolazine (RANEXA) 1000 MG SR tablet Take 1 tablet (1,000 mg total) by mouth 2 (two) times daily. 180 tablet 3  . traMADol (ULTRAM) 50 MG tablet Take 50 mg by mouth 3 (three) times daily as needed. For pain    . atorvastatin (LIPITOR) 40 MG tablet Take 1 tablet (40 mg total) by mouth daily. 90 tablet 3   No current facility-administered medications for this visit.     Allergies  Allergen Reactions  . Beef-Derived Products Anaphylaxis  . Other Anaphylaxis    No mammal protein substances  . Benazepril Cough  . Erythromycin Diarrhea  . Indomethacin Diarrhea  . Biaxin [Clarithromycin] Rash  . Penicillins Rash    Social History  Socioeconomic History  . Marital status: Married    Spouse name: Not on file  . Number of children: 2  . Years of education: Not on file  . Highest education level: Not on file  Occupational History  . Occupation: URGENT CARE    Employer: [employer]  Social Needs  . Financial resource strain: Not on file  . Food insecurity    Worry: Not on file    Inability: Not on file  . Transportation needs    Medical: Not on file    Non-medical: Not on file  Tobacco Use  . Smoking status: Never Smoker  . Smokeless tobacco: Never Used  Substance and Sexual Activity  . Alcohol use: Yes    Alcohol/week: 0.0 standard drinks    Comment: Occasional - few drinks a month  . Drug use: No  . Sexual activity: Not on file  Lifestyle  .  Physical activity    Days per week: Not on file    Minutes per session: Not on file  . Stress: Not on file  Relationships  . Social Herbalist on phone: Not on file    Gets together: Not on file    Attends religious service: Not on file    Active member of club or organization: Not on file    Attends meetings of clubs or organizations: Not on file    Relationship status: Not on file  . Intimate partner violence    Fear of current or ex partner: Not on file    Emotionally abused: Not on file    Physically abused: Not on file    Forced sexual activity: Not on file  Other Topics Concern  . Not on file  Social History Narrative  . Not on file     Review of Systems: As noted in HPI.  All other systems reviewed and are otherwise negative except as noted above.    Blood pressure 140/89, pulse 98, temperature (!) 96.8 F (36 C), height 5\' 3"  (1.6 m), weight 223 lb 9.6 oz (101.4 kg), SpO2 98 %.  GENERAL:  Well appearing WF in NAD HEENT:  PERRL, EOMI, sclera are clear. Oropharynx is clear. NECK:  No jugular venous distention, carotid upstroke brisk and symmetric, no bruits, no thyromegaly or adenopathy LUNGS:  Clear to auscultation bilaterally CHEST:  Unremarkable HEART:  RRR,  PMI not displaced or sustained,S1 and S2 within normal limits, no S3, no S4: no clicks, no rubs, no murmurs ABD:  Soft, nontender. BS +, no masses or bruits. No hepatomegaly, no splenomegaly EXT:  2 + pulses throughout, no edema, no cyanosis no clubbing SKIN:  Warm and dry.  No rashes NEURO:  Alert and oriented x 3. Cranial nerves II through XII intact. PSYCH:  Cognitively intact   Laboratory data:  Dated 10/30/16: cholesterol 177, triglycerides 132, HDL 51, LDL 100. A1c 5.8%. Chemistries normal. Dated 05/06/18: cholesterol  183, triglycerides 202, HDL 42, LDL 100. A1c 5.9% Dated 10/15/18: normal CBC and chemistries.  Dated 07/01/19: normal CBC and chemistries.  Dated 05/13/19: cholesterol 151,  triglycerides 182, HDL 46, LDL 69. A1c 5.8%.   Ecg today shows NSR rate 83. Incomplete RBBB. No change from prior. I have personally reviewed and interpreted this study.  ASSESSMENT AND PLAN:  1. CAD s/p stenting of the LAD in 2012 with Resolute integrity stent. Repeat cath in November 2016 showed patent stent with nonobstructive disease. She has stable angina class 1-2. Continue current medication including beta  blocker, nitrates, amlodipine and Ranexa.  2. HTN- controlled.  3. Dyslipidemia- at goal LDL < 70. Last LDL 69. Continue current therapy 4. Obesity. Encouraged continued weight loss.  PLAN  Follow up in one year.  Nyima Vanacker Martinique MD,FACC 11/18/2019 3:25 PM

## 2019-11-18 ENCOUNTER — Other Ambulatory Visit: Payer: Self-pay

## 2019-11-18 ENCOUNTER — Encounter: Payer: Self-pay | Admitting: Cardiology

## 2019-11-18 ENCOUNTER — Ambulatory Visit (INDEPENDENT_AMBULATORY_CARE_PROVIDER_SITE_OTHER): Payer: Medicare Other | Admitting: Cardiology

## 2019-11-18 VITALS — BP 140/89 | HR 98 | Temp 96.8°F | Ht 63.0 in | Wt 223.6 lb

## 2019-11-18 DIAGNOSIS — E785 Hyperlipidemia, unspecified: Secondary | ICD-10-CM | POA: Diagnosis not present

## 2019-11-18 DIAGNOSIS — I2510 Atherosclerotic heart disease of native coronary artery without angina pectoris: Secondary | ICD-10-CM

## 2019-11-18 DIAGNOSIS — I10 Essential (primary) hypertension: Secondary | ICD-10-CM | POA: Diagnosis not present

## 2019-11-18 MED ORDER — ATORVASTATIN CALCIUM 40 MG PO TABS: 40.0000 mg | ORAL_TABLET | Freq: Every day | ORAL | 3 refills | Status: DC

## 2019-11-18 MED ORDER — AMLODIPINE BESYLATE 2.5 MG PO TABS: 2.5000 mg | ORAL_TABLET | Freq: Every day | ORAL | 3 refills | Status: DC

## 2019-11-18 MED ORDER — ISOSORBIDE MONONITRATE ER 30 MG PO TB24: 15.0000 mg | ORAL_TABLET | Freq: Every day | ORAL | 3 refills | Status: DC

## 2019-11-18 MED ORDER — RANOLAZINE ER 1000 MG PO TB12: 1000.0000 mg | ORAL_TABLET | Freq: Two times a day (BID) | ORAL | 3 refills | Status: DC

## 2019-11-26 ENCOUNTER — Other Ambulatory Visit: Payer: Self-pay

## 2019-11-30 ENCOUNTER — Other Ambulatory Visit: Payer: Self-pay

## 2019-11-30 DIAGNOSIS — I2510 Atherosclerotic heart disease of native coronary artery without angina pectoris: Secondary | ICD-10-CM

## 2019-11-30 MED ORDER — ISOSORBIDE MONONITRATE ER 30 MG PO TB24: 15.0000 mg | ORAL_TABLET | Freq: Every day | ORAL | 3 refills | Status: DC

## 2019-12-14 ENCOUNTER — Other Ambulatory Visit: Payer: Self-pay

## 2019-12-14 MED ORDER — ATORVASTATIN CALCIUM 40 MG PO TABS: 40.0000 mg | ORAL_TABLET | Freq: Every day | ORAL | 3 refills | Status: DC

## 2019-12-31 DIAGNOSIS — Z79899 Other long term (current) drug therapy: Secondary | ICD-10-CM | POA: Diagnosis not present

## 2019-12-31 DIAGNOSIS — M150 Primary generalized (osteo)arthritis: Secondary | ICD-10-CM | POA: Diagnosis not present

## 2019-12-31 DIAGNOSIS — M797 Fibromyalgia: Secondary | ICD-10-CM | POA: Diagnosis not present

## 2019-12-31 DIAGNOSIS — M2550 Pain in unspecified joint: Secondary | ICD-10-CM | POA: Diagnosis not present

## 2020-01-01 DIAGNOSIS — Z79899 Other long term (current) drug therapy: Secondary | ICD-10-CM | POA: Diagnosis not present

## 2020-01-01 DIAGNOSIS — M150 Primary generalized (osteo)arthritis: Secondary | ICD-10-CM | POA: Diagnosis not present

## 2020-01-12 ENCOUNTER — Other Ambulatory Visit: Payer: Self-pay | Admitting: Cardiology

## 2020-01-12 DIAGNOSIS — I10 Essential (primary) hypertension: Secondary | ICD-10-CM

## 2020-01-26 ENCOUNTER — Other Ambulatory Visit: Payer: Self-pay | Admitting: Cardiology

## 2020-02-09 ENCOUNTER — Other Ambulatory Visit: Payer: Self-pay | Admitting: Family Medicine

## 2020-02-09 DIAGNOSIS — Z1231 Encounter for screening mammogram for malignant neoplasm of breast: Secondary | ICD-10-CM

## 2020-02-12 ENCOUNTER — Other Ambulatory Visit: Payer: Self-pay

## 2020-02-12 ENCOUNTER — Ambulatory Visit
Admission: RE | Admit: 2020-02-12 | Discharge: 2020-02-12 | Disposition: A | Discharge status: Home / Self Care | Payer: Medicare Other | Source: Ambulatory Visit | Attending: Family Medicine | Admitting: Family Medicine

## 2020-02-12 DIAGNOSIS — Z1231 Encounter for screening mammogram for malignant neoplasm of breast: Secondary | ICD-10-CM | POA: Diagnosis not present

## 2020-02-15 ENCOUNTER — Ambulatory Visit: Payer: Medicare Other | Attending: Internal Medicine

## 2020-02-15 DIAGNOSIS — Z23 Encounter for immunization: Secondary | ICD-10-CM | POA: Insufficient documentation

## 2020-02-15 NOTE — Progress Notes (Signed)
   Covid-19 Vaccination Clinic  Name:  Courtney Steele    MRN: [id number] DOB: 1951-01-21  02/15/2020  [name] was observed post Covid-19 immunization for 15 minutes without incident. She was provided with Vaccine Information Sheet and instruction to access the V-Safe system.   [name] was instructed to call 911 with any severe reactions post vaccine: Marland Kitchen Difficulty breathing  . Swelling of face and throat  . A fast heartbeat  . A bad rash all over body  . Dizziness and weakness   Immunizations Administered    Name Date Dose VIS Date Route   Pfizer COVID-19 Vaccine 02/15/2020 10:52 AM 0.3 mL 11/20/2019 Intramuscular   Manufacturer: [place]   Lot: [id number]   [place]: [id number]

## 2020-03-07 DIAGNOSIS — H26493 Other secondary cataract, bilateral: Secondary | ICD-10-CM | POA: Diagnosis not present

## 2020-03-09 DIAGNOSIS — M65842 Other synovitis and tenosynovitis, left hand: Secondary | ICD-10-CM | POA: Diagnosis not present

## 2020-03-09 DIAGNOSIS — M65342 Trigger finger, left ring finger: Secondary | ICD-10-CM | POA: Diagnosis not present

## 2020-03-09 DIAGNOSIS — M1812 Unilateral primary osteoarthritis of first carpometacarpal joint, left hand: Secondary | ICD-10-CM | POA: Diagnosis not present

## 2020-03-16 ENCOUNTER — Ambulatory Visit: Payer: Medicare Other | Attending: Internal Medicine

## 2020-03-16 DIAGNOSIS — Z23 Encounter for immunization: Secondary | ICD-10-CM

## 2020-03-16 NOTE — Progress Notes (Signed)
   Covid-19 Vaccination Clinic  Name:  Courtney Steele    MRN: [id number] DOB: Mar 06, 1951  03/16/2020  [name] was observed post Covid-19 immunization for 30 minutes based on pre-vaccination screening without incident. She was provided with Vaccine Information Sheet and instruction to access the V-Safe system.   [name] was instructed to call 911 with any severe reactions post vaccine: Marland Kitchen Difficulty breathing  . Swelling of face and throat  . A fast heartbeat  . A bad rash all over body  . Dizziness and weakness   Immunizations Administered    Name Date Dose VIS Date Route   Pfizer COVID-19 Vaccine 03/16/2020 11:28 AM 0.3 mL 11/20/2019 Intramuscular   Manufacturer: [employer]   Lot: [id number]   [place]: [id number]

## 2020-03-18 ENCOUNTER — Other Ambulatory Visit: Payer: Self-pay

## 2020-03-18 ENCOUNTER — Emergency Department (HOSPITAL_BASED_OUTPATIENT_CLINIC_OR_DEPARTMENT_OTHER)
Admission: EM | Admit: 2020-03-18 | Discharge: 2020-03-18 | Disposition: A | Discharge status: Home / Self Care | Payer: Medicare Other | Attending: Emergency Medicine | Admitting: Emergency Medicine

## 2020-03-18 ENCOUNTER — Encounter (HOSPITAL_BASED_OUTPATIENT_CLINIC_OR_DEPARTMENT_OTHER): Payer: Self-pay

## 2020-03-18 DIAGNOSIS — E785 Hyperlipidemia, unspecified: Secondary | ICD-10-CM | POA: Diagnosis not present

## 2020-03-18 DIAGNOSIS — Z7982 Long term (current) use of aspirin: Secondary | ICD-10-CM | POA: Diagnosis not present

## 2020-03-18 DIAGNOSIS — Z888 Allergy status to other drugs, medicaments and biological substances status: Secondary | ICD-10-CM | POA: Insufficient documentation

## 2020-03-18 DIAGNOSIS — Z880 Allergy status to penicillin: Secondary | ICD-10-CM | POA: Diagnosis not present

## 2020-03-18 DIAGNOSIS — Z882 Allergy status to sulfonamides status: Secondary | ICD-10-CM | POA: Insufficient documentation

## 2020-03-18 DIAGNOSIS — E119 Type 2 diabetes mellitus without complications: Secondary | ICD-10-CM | POA: Diagnosis not present

## 2020-03-18 DIAGNOSIS — I10 Essential (primary) hypertension: Secondary | ICD-10-CM | POA: Insufficient documentation

## 2020-03-18 DIAGNOSIS — T7840XA Allergy, unspecified, initial encounter: Secondary | ICD-10-CM | POA: Insufficient documentation

## 2020-03-18 DIAGNOSIS — Z79899 Other long term (current) drug therapy: Secondary | ICD-10-CM | POA: Insufficient documentation

## 2020-03-18 DIAGNOSIS — I2510 Atherosclerotic heart disease of native coronary artery without angina pectoris: Secondary | ICD-10-CM | POA: Diagnosis not present

## 2020-03-18 DIAGNOSIS — R21 Rash and other nonspecific skin eruption: Secondary | ICD-10-CM | POA: Diagnosis present

## 2020-03-18 DIAGNOSIS — Z7984 Long term (current) use of oral hypoglycemic drugs: Secondary | ICD-10-CM | POA: Insufficient documentation

## 2020-03-18 MED ORDER — PREDNISONE 50 MG PO TABS: 50.0000 mg | ORAL_TABLET | Freq: Every day | ORAL | 0 refills | Status: AC

## 2020-03-18 MED ORDER — METHYLPREDNISOLONE SODIUM SUCC 125 MG IJ SOLR
125.0000 mg | Freq: Once | INTRAMUSCULAR | Status: AC
  Administered 2020-03-18: 125 mg via INTRAVENOUS
  Filled 2020-03-18: qty 2

## 2020-03-18 MED ORDER — FAMOTIDINE IN NACL 20-0.9 MG/50ML-% IV SOLN
20.0000 mg | Freq: Once | INTRAVENOUS | Status: AC
  Administered 2020-03-18: 15:00:00 20 mg via INTRAVENOUS
  Filled 2020-03-18: qty 50

## 2020-03-18 MED ORDER — FAMOTIDINE 20 MG PO TABS: 20.0000 mg | ORAL_TABLET | Freq: Two times a day (BID) | ORAL | 0 refills | Status: DC

## 2020-03-18 NOTE — Discharge Instructions (Addendum)
Please take the prednisone burst as well as the famotidine, as prescribed.  I encourage you to continue taking your H1 blockers at home.  Please avoid red meat given your alpha gal allergy.  You may continue your gabapentin, however if the rash returns you will need to discontinue immediately and return to the ED for evaluation.  Please follow-up with your primary care provider regarding today's encounter.  Return to the ED or seek immediate medical attention should you experience any new or worsening symptoms.

## 2020-03-18 NOTE — ED Notes (Signed)
Pt resting comfortably, reports feeling slight improvement in symptoms.

## 2020-03-18 NOTE — ED Notes (Signed)
ED Provider at bedside. 

## 2020-03-18 NOTE — ED Provider Notes (Addendum)
[facility] DE[facility] Provider Note   CSN: [id number] Arrival date & time: 03/18/20  1136     History Chief Complaint  Patient presents with  . Rash    Courtney Steele is a 69 y.o. female with PMH significant for alpha gal allergy who presents the ED with acute onset rash involving trunk.  Patient reports that she had a small amount of beef last evening for the first time in over a year.  She was unsure as to whether or not she would be able to tolerate it.  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  She reports taking Benadryl, Zyrtec, and Allegra.  She denies any shortness of breath symptoms, tongue swelling, abdominal discomfort, nausea or vomiting, or other extra dermal symptoms.  She is resting currently on exam and is in no acute distress.  She also reports having a COVID-19 vaccine administered last week and is unsure whether or not that played a role.  HPI     Past Medical History:  Diagnosis Date  . Borderline diabetes   . CAD (coronary artery disease)    a. 2012: DES to LAD   . Degenerative joint disease   . Diabetes mellitus   . Fibromyalgia   . Groin hematoma   . Hematoma    Radial catheter site July 13, 2011  . Hyperlipidemia   . Hypertension   . Morbid obesity ([place])   . Osteoarthritis     Patient Active Problem List   Diagnosis Date Noted  . Morbid obesity ([place])   . Hypertension   . Hyperlipidemia   . Degenerative joint disease   . CAD (coronary artery disease)   . Borderline diabetes   . Obesity (BMI 30-39.9) 11/14/2015  . Pain in the chest   . Unstable angina ([place]) 10/28/2015  . Prediabetes 10/28/2015  . Allergy with anaphylaxis due to food 08/08/2015  . Osteoarthritis   . Midsternal chest pain 02/23/2012  . Dizziness 10/29/2011  . CAD S/P LAD DES 2012   . Essential hypertension   . Dyslipidemia, goal LDL below 70   . Fibromyalgia   . Groin hematoma   .  Hematoma     Past Surgical History:  Procedure Laterality Date  . ABDOMINAL HYSTERECTOMY    . ANGIOPLASTY    . BUNIONECTOMY    . CARDIAC CATHETERIZATION     revealing patency of the LAD stent with mild compromise of  the diagonal and moderate mid LAD stenosis, but does not appear to  be high grade.   Marland Kitchen CARDIAC CATHETERIZATION N/A 10/31/2015   Procedure: Left Heart Cath and Coronary Angiography;  Surgeon: Peter M Martinique, MD;  Location: [facility];  Service: Cardiovascular;  Laterality: N/A;  . CARPAL TUNNEL RELEASE    . CATARACT EXTRACTION    . CHOLECYSTECTOMY    . KNEE ARTHROSCOPY    . TONSILLECTOMY       OB History   No obstetric history on file.     Family History  Problem Relation Age of Onset  . Other Mother        died pulmonary issues  . Coronary artery disease Father 58       died- CABG-HTN- AAA  . Breast cancer Maternal Aunt   . Breast cancer Paternal Aunt   . Breast cancer Cousin     Social History   Tobacco Use  . Smoking status: Never  Smoker  . Smokeless tobacco: Never Used  Substance Use Topics  . Alcohol use: Yes    Alcohol/week: 0.0 standard drinks    Comment: occ  . Drug use: No    Home Medications Prior to Admission medications   Medication Sig Start Date End Date Taking? Authorizing Provider  albuterol (PROVENTIL HFA;VENTOLIN HFA) 108 (90 BASE) MCG/ACT inhaler Inhale 1-2 puffs into the lungs every 6 (six) hours as needed for wheezing or shortness of breath. 05/10/15   Debby Freiberg, MD  amLODipine (NORVASC) 2.5 MG tablet Take 1 tablet (2.5 mg total) by mouth daily. 11/18/19   Martinique, Peter M, MD  aspirin EC 81 MG tablet Take 81 mg by mouth daily.    [provider]  atorvastatin (LIPITOR) 40 MG tablet Take 1 tablet (40 mg total) by mouth daily. 12/14/19 12/08/20  Martinique, Peter M, MD  DULoxetine (CYMBALTA) 60 MG capsule TK 1 C PO QD 08/18/18   [provider]  EPINEPHrine 0.3 mg/0.3 mL IJ SOAJ injection Inject 0.3 mLs (0.3  mg total) into the muscle once. 05/10/15   Debby Freiberg, MD  ergocalciferol (VITAMIN D2) 50000 UNITS capsule Take 50,000 Units by mouth once a week. Take on Saturday    [provider]  famotidine (PEPCID) 20 MG tablet Take 1 tablet (20 mg total) by mouth 2 (two) times daily for 10 days. 03/18/20 03/28/20  Corena Herter, PA-C  isosorbide mononitrate (IMDUR) 30 MG 24 hr tablet TAKE 1/2 TABLET BY MOUTH DAILY 11/18/18   Martinique, Peter M, MD  isosorbide mononitrate (IMDUR) 30 MG 24 hr tablet Take 0.5 tablets (15 mg total) by mouth daily. 11/30/19   Erlene Quan, PA-C  meloxicam (MOBIC) 15 MG tablet  07/13/19   [provider]  metoprolol succinate (TOPROL-XL) 25 MG 24 hr tablet TAKE 1 TABLET BY MOUTH DAILY 01/12/20   Martinique, Peter M, MD  nitroGLYCERIN (NITROSTAT) 0.4 MG SL tablet Place 1 tablet (0.4 mg total) under the tongue every 5 (five) minutes as needed. For chest pain 06/13/16   Kerin Ransom K, PA-C  ondansetron (ZOFRAN ODT) 4 MG disintegrating tablet Take 1 tablet (4 mg total) by mouth every 8 (eight) hours as needed for nausea or vomiting. 07/25/16   Horton, Barbette Hair, MD  oxyCODONE-acetaminophen (PERCOCET/ROXICET) 5-325 MG tablet Take 1-2 tablets by mouth every 6 (six) hours as needed for severe pain. 07/25/16   Horton, Barbette Hair, MD  pantoprazole (PROTONIX) 40 MG tablet TAKE 1 TABLET(40 MG) BY MOUTH DAILY 01/26/20   Martinique, Peter M, MD  predniSONE (DELTASONE) 50 MG tablet Take 1 tablet (50 mg total) by mouth daily with breakfast for 5 days. 03/18/20 03/23/20  Corena Herter, PA-C  pregabalin (LYRICA) 75 MG capsule TK 1 C PO BID 10/17/18   [provider]  ranolazine (RANEXA) 1000 MG SR tablet Take 1 tablet (1,000 mg total) by mouth 2 (two) times daily. 11/18/19   Martinique, Peter M, MD  traMADol (ULTRAM) 50 MG tablet Take 50 mg by mouth 3 (three) times daily as needed. For pain    [provider]    Allergies    Beef-derived products, Other, Benazepril, Erythromycin,  Indomethacin, Lisinopril, Biaxin [clarithromycin], and Penicillins  Review of Systems   Review of Systems  Respiratory: Negative for shortness of breath and wheezing.   Gastrointestinal: Negative for abdominal pain and nausea.  Skin: Positive for rash.    Physical Exam Updated Vital Signs BP 116/60 (BP Location: Right Arm)   Pulse  69   Temp 98.8 F (37.1 C) (Oral)   Resp 16   Ht 5\' 4"  (1.626 m)   Wt 101.2 kg   SpO2 94%   BMI 38.28 kg/m   Physical Exam Vitals and nursing note reviewed. Exam conducted with a chaperone present.  Constitutional:      Appearance: Normal appearance.  HENT:     Head: Normocephalic and atraumatic.     Mouth/Throat:     Comments: Patent oropharynx.  No tongue swelling or floor mouth induration. Eyes:     General: No scleral icterus.    Extraocular Movements: Extraocular movements intact.     Conjunctiva/sclera: Conjunctivae normal.     Pupils: Pupils are equal, round, and reactive to light.  Cardiovascular:     Rate and Rhythm: Normal rate and regular rhythm.     Pulses: Normal pulses.     Heart sounds: Normal heart sounds.  Pulmonary:     Comments: No increased respiratory effort.  No wheezing.  Breath sounds intact bilaterally.  No tachypnea.  Chest rise symmetric.  No accessory muscle use. Abdominal:     General: Abdomen is flat. There is no distension.     Palpations: Abdomen is soft.     Tenderness: There is no abdominal tenderness. There is no guarding.  Musculoskeletal:     Cervical back: Normal range of motion. No rigidity.  Skin:    General: Skin is dry.     Capillary Refill: Capillary refill takes less than 2 seconds.     Comments: Diffuse rash consisting of erythematous wheals involving torso and upper arms.  No extension into legs, neck, or face.  Nontender to palpation.  Neurological:     Mental Status: She is alert.     GCS: GCS eye subscore is 4. GCS verbal subscore is 5. GCS motor subscore is 6.  Psychiatric:        Mood  and Affect: Mood normal.        Behavior: Behavior normal.        Thought Content: Thought content normal.      ED Results / Procedures / Treatments   Labs (all labs ordered are listed, but only abnormal results are displayed) Labs Reviewed - No data to display  EKG None  Radiology No results found.  Procedures Procedures (including critical care time)  Medications Ordered in ED Medications  methylPREDNISolone sodium succinate (SOLU-MEDROL) 125 mg/2 mL injection 125 mg (125 mg Intravenous Given 03/18/20 1453)  famotidine (PEPCID) IVPB 20 mg premix (20 mg Intravenous New Bag/Given 03/18/20 1456)    ED Course  I have reviewed the triage vital signs and the nursing notes.  Pertinent labs & imaging results that were available during my care of the patient were reviewed by me and considered in my medical decision making (see chart for details).    MDM Rules/Calculators/A&P                      While patient has dermatologic manifestations consistent with an allergic reaction, she denies any gastrointestinal or respiratory symptoms otherwise concerning for anaphylaxis.  Patient already took H1 blockers while at home prior to arrival to the ED.  Will administer IV Solu-Medrol in addition to IV famotidine.  Suspect that her allergic reaction was due to her alpha gal allergy rather than her gabapentin that she began over a month ago.  Also have low suspicion for COVID-19 vaccine last week as precipitating factor.  Encouraged her to discontinue red  meat.  She may continue her gabapentin, however if her rash returns she will have to discontinue immediately.   On reevaluation, patient is feeling improved.  Her rash is improved significantly she is denying any significant pruritus.  No other symptoms.  She feels back back to baseline.  Will discharge home with a short course of prednisone which she states is well-tolerated.  We will also prescribe a course of famotidine H2 blockers.  Discussed  case with Dr. Gilford Raid who agrees with assessment and plan.   Strict ED return precautions discussed.  All of the evaluation and work-up results were discussed with the patient and any family at bedside. They were provided opportunity to ask any additional questions and have none at this time. They have expressed understanding of verbal discharge instructions as well as return precautions and are agreeable to the plan.    Final Clinical Impression(s) / ED Diagnoses Final diagnoses:  Allergic reaction, initial encounter    Rx / DC Orders ED Discharge Orders         Ordered    predniSONE (DELTASONE) 50 MG tablet  Daily with breakfast     03/18/20 1634    famotidine (PEPCID) 20 MG tablet  2 times daily     03/18/20 1634           Corena Herter, PA-C 03/18/20 1635    Corena Herter, PA-C 03/18/20 1636    Isla Pence, MD 03/19/20 [phone]

## 2020-03-18 NOTE — ED Triage Notes (Addendum)
Pt c/o redness/rash to face and trunk started ~9am-took benadryl, zyrtec, allegra PTA-states may be r/t to allergic reaction to beef with known hx and ate beef last night-also states she had covid vaccine #2 on 4/7-NAD-steady gait

## 2020-04-06 DIAGNOSIS — M65342 Trigger finger, left ring finger: Secondary | ICD-10-CM | POA: Diagnosis not present

## 2020-04-22 ENCOUNTER — Telehealth: Payer: Self-pay | Admitting: Cardiology

## 2020-04-22 DIAGNOSIS — H34219 Partial retinal artery occlusion, unspecified eye: Secondary | ICD-10-CM

## 2020-04-22 DIAGNOSIS — H18413 Arcus senilis, bilateral: Secondary | ICD-10-CM | POA: Diagnosis not present

## 2020-04-22 DIAGNOSIS — H26492 Other secondary cataract, left eye: Secondary | ICD-10-CM | POA: Diagnosis not present

## 2020-04-22 DIAGNOSIS — H26491 Other secondary cataract, right eye: Secondary | ICD-10-CM | POA: Diagnosis not present

## 2020-04-22 DIAGNOSIS — E119 Type 2 diabetes mellitus without complications: Secondary | ICD-10-CM | POA: Diagnosis not present

## 2020-04-22 NOTE — Telephone Encounter (Signed)
Spoke to patient Dr.Jordan advised to have carotid doppler.Scheduler will be calling to schedule.

## 2020-04-22 NOTE — Telephone Encounter (Signed)
Yes we should get carotid dopplers on her  Courtney Burggraf Martinique MD, [facility]

## 2020-04-22 NOTE — Telephone Encounter (Signed)
Also routed to primary nurse

## 2020-04-22 NOTE — Telephone Encounter (Signed)
Took DOD call today from the patient's ophthalmologist.  On exam she was noted to have a Hollenhorst plaque.  This is obviously concerning for possible atheroembolism.  I will direct this to Dr. Martinique who may want to order carotid Dopplers on her.  She already is on statin therapy.   Dr. Debara Pickett

## 2020-04-22 NOTE — Telephone Encounter (Signed)
Ophthalmology office calling to speak with DOD

## 2020-04-27 ENCOUNTER — Ambulatory Visit (HOSPITAL_COMMUNITY): Payer: Medicare Other

## 2020-04-27 NOTE — Telephone Encounter (Signed)
Carotid dopplers scheduled 05/05/20 at 3:00 pm.

## 2020-05-05 ENCOUNTER — Other Ambulatory Visit: Payer: Self-pay

## 2020-05-05 ENCOUNTER — Ambulatory Visit (HOSPITAL_COMMUNITY)
Admission: RE | Admit: 2020-05-05 | Discharge: 2020-05-05 | Disposition: A | Discharge status: Home / Self Care | Payer: Medicare Other | Source: Ambulatory Visit | Attending: Cardiology | Admitting: Cardiology

## 2020-05-05 DIAGNOSIS — H34219 Partial retinal artery occlusion, unspecified eye: Secondary | ICD-10-CM | POA: Insufficient documentation

## 2020-07-19 ENCOUNTER — Other Ambulatory Visit: Payer: Self-pay | Admitting: Cardiology

## 2020-07-19 DIAGNOSIS — I10 Essential (primary) hypertension: Secondary | ICD-10-CM

## 2020-07-21 DIAGNOSIS — D1723 Benign lipomatous neoplasm of skin and subcutaneous tissue of right leg: Secondary | ICD-10-CM | POA: Diagnosis not present

## 2020-09-12 DIAGNOSIS — Z23 Encounter for immunization: Secondary | ICD-10-CM | POA: Diagnosis not present

## 2020-09-25 ENCOUNTER — Other Ambulatory Visit: Payer: Self-pay | Admitting: Cardiology

## 2020-09-25 DIAGNOSIS — I10 Essential (primary) hypertension: Secondary | ICD-10-CM

## 2020-09-30 DIAGNOSIS — J45909 Unspecified asthma, uncomplicated: Secondary | ICD-10-CM | POA: Diagnosis not present

## 2020-09-30 DIAGNOSIS — J069 Acute upper respiratory infection, unspecified: Secondary | ICD-10-CM | POA: Diagnosis not present

## 2020-10-01 DIAGNOSIS — Z03818 Encounter for observation for suspected exposure to other biological agents ruled out: Secondary | ICD-10-CM | POA: Diagnosis not present

## 2020-10-01 DIAGNOSIS — R059 Cough, unspecified: Secondary | ICD-10-CM | POA: Diagnosis not present

## 2020-10-01 DIAGNOSIS — J40 Bronchitis, not specified as acute or chronic: Secondary | ICD-10-CM | POA: Diagnosis not present

## 2020-10-01 DIAGNOSIS — R509 Fever, unspecified: Secondary | ICD-10-CM | POA: Diagnosis not present

## 2020-10-01 DIAGNOSIS — Z8709 Personal history of other diseases of the respiratory system: Secondary | ICD-10-CM | POA: Diagnosis not present

## 2020-10-06 DIAGNOSIS — E119 Type 2 diabetes mellitus without complications: Secondary | ICD-10-CM | POA: Diagnosis not present

## 2020-10-06 DIAGNOSIS — H26491 Other secondary cataract, right eye: Secondary | ICD-10-CM | POA: Diagnosis not present

## 2020-10-06 DIAGNOSIS — Z961 Presence of intraocular lens: Secondary | ICD-10-CM | POA: Diagnosis not present

## 2020-10-06 DIAGNOSIS — H34211 Partial retinal artery occlusion, right eye: Secondary | ICD-10-CM | POA: Diagnosis not present

## 2020-10-25 ENCOUNTER — Other Ambulatory Visit: Payer: Self-pay | Admitting: Cardiology

## 2020-10-27 DIAGNOSIS — Z23 Encounter for immunization: Secondary | ICD-10-CM | POA: Diagnosis not present

## 2020-11-10 ENCOUNTER — Other Ambulatory Visit: Payer: Self-pay | Admitting: Cardiology

## 2020-11-11 ENCOUNTER — Other Ambulatory Visit: Payer: Self-pay | Admitting: Physician Assistant

## 2020-11-11 ENCOUNTER — Ambulatory Visit
Admission: RE | Admit: 2020-11-11 | Discharge: 2020-11-11 | Disposition: A | Discharge status: Home / Self Care | Payer: Medicare Other | Source: Ambulatory Visit | Attending: Physician Assistant | Admitting: Physician Assistant

## 2020-11-11 DIAGNOSIS — R079 Chest pain, unspecified: Secondary | ICD-10-CM | POA: Diagnosis not present

## 2020-11-11 DIAGNOSIS — I700 Atherosclerosis of aorta: Secondary | ICD-10-CM | POA: Diagnosis not present

## 2020-11-11 DIAGNOSIS — R109 Unspecified abdominal pain: Secondary | ICD-10-CM | POA: Diagnosis not present

## 2020-11-11 DIAGNOSIS — I119 Hypertensive heart disease without heart failure: Secondary | ICD-10-CM | POA: Diagnosis not present

## 2020-11-11 DIAGNOSIS — I2510 Atherosclerotic heart disease of native coronary artery without angina pectoris: Secondary | ICD-10-CM | POA: Diagnosis not present

## 2020-11-11 DIAGNOSIS — E1169 Type 2 diabetes mellitus with other specified complication: Secondary | ICD-10-CM | POA: Diagnosis not present

## 2020-11-25 ENCOUNTER — Emergency Department (HOSPITAL_COMMUNITY)
Admission: EM | Admit: 2020-11-25 | Discharge: 2020-11-26 | Disposition: A | Discharge status: Home / Self Care | Payer: Medicare Other | Attending: Emergency Medicine | Admitting: Emergency Medicine

## 2020-11-25 ENCOUNTER — Other Ambulatory Visit: Payer: Self-pay

## 2020-11-25 ENCOUNTER — Encounter (HOSPITAL_COMMUNITY): Payer: Self-pay | Admitting: Emergency Medicine

## 2020-11-25 DIAGNOSIS — Z7982 Long term (current) use of aspirin: Secondary | ICD-10-CM | POA: Diagnosis not present

## 2020-11-25 DIAGNOSIS — K449 Diaphragmatic hernia without obstruction or gangrene: Secondary | ICD-10-CM | POA: Diagnosis not present

## 2020-11-25 DIAGNOSIS — Z955 Presence of coronary angioplasty implant and graft: Secondary | ICD-10-CM | POA: Diagnosis not present

## 2020-11-25 DIAGNOSIS — Z79899 Other long term (current) drug therapy: Secondary | ICD-10-CM | POA: Diagnosis not present

## 2020-11-25 DIAGNOSIS — R0789 Other chest pain: Secondary | ICD-10-CM | POA: Diagnosis not present

## 2020-11-25 DIAGNOSIS — R079 Chest pain, unspecified: Secondary | ICD-10-CM | POA: Diagnosis not present

## 2020-11-25 DIAGNOSIS — I2510 Atherosclerotic heart disease of native coronary artery without angina pectoris: Secondary | ICD-10-CM | POA: Diagnosis not present

## 2020-11-25 DIAGNOSIS — R000 Tachycardia, unspecified: Secondary | ICD-10-CM | POA: Diagnosis not present

## 2020-11-25 DIAGNOSIS — I700 Atherosclerosis of aorta: Secondary | ICD-10-CM | POA: Diagnosis not present

## 2020-11-25 DIAGNOSIS — E119 Type 2 diabetes mellitus without complications: Secondary | ICD-10-CM | POA: Insufficient documentation

## 2020-11-25 DIAGNOSIS — Z9049 Acquired absence of other specified parts of digestive tract: Secondary | ICD-10-CM | POA: Diagnosis not present

## 2020-11-25 DIAGNOSIS — K5730 Diverticulosis of large intestine without perforation or abscess without bleeding: Secondary | ICD-10-CM | POA: Diagnosis not present

## 2020-11-25 DIAGNOSIS — I10 Essential (primary) hypertension: Secondary | ICD-10-CM | POA: Diagnosis not present

## 2020-11-25 LAB — CBC
HCT: 40.8 % (ref 36.0–46.0)
Hemoglobin: 13.2 g/dL (ref 12.0–15.0)
MCH: 30.6 pg (ref 26.0–34.0)
MCHC: 32.4 g/dL (ref 30.0–36.0)
MCV: 94.7 fL (ref 80.0–100.0)
Platelets: 230 10*3/uL (ref 150–400)
RBC: 4.31 MIL/uL (ref 3.87–5.11)
RDW: 13.5 % (ref 11.5–15.5)
WBC: 6.4 10*3/uL (ref 4.0–10.5)
nRBC: 0 % (ref 0.0–0.2)

## 2020-11-25 LAB — BASIC METABOLIC PANEL
Anion gap: 13 (ref 5–15)
BUN: 19 mg/dL (ref 8–23)
CO2: 26 mmol/L (ref 22–32)
Calcium: 9.1 mg/dL (ref 8.9–10.3)
Chloride: 103 mmol/L (ref 98–111)
Creatinine, Ser: 0.94 mg/dL (ref 0.44–1.00)
GFR, Estimated: >60 mL/min (ref >60)
Glucose, Bld: 105 mg/dL — ABNORMAL HIGH (ref 70–99)
Potassium: 3.8 mmol/L (ref 3.5–5.1)
Sodium: 142 mmol/L (ref 135–145)

## 2020-11-25 LAB — TROPONIN I (HIGH SENSITIVITY)
Troponin I (High Sensitivity): 4 ng/L (ref <18)
Troponin I (High Sensitivity): 5 ng/L (ref <18)

## 2020-11-25 NOTE — ED Triage Notes (Addendum)
Pt presents to ED BIB [facility]. Pt c/o chest "pressure" that radiates to jaw and arms x2d. Pt taken 324 ASA and nitro x1  EMS VS -  144/90 HR - 80

## 2020-11-26 ENCOUNTER — Emergency Department (HOSPITAL_COMMUNITY): Payer: Medicare Other

## 2020-11-26 DIAGNOSIS — K5730 Diverticulosis of large intestine without perforation or abscess without bleeding: Secondary | ICD-10-CM | POA: Diagnosis not present

## 2020-11-26 DIAGNOSIS — K449 Diaphragmatic hernia without obstruction or gangrene: Secondary | ICD-10-CM | POA: Diagnosis not present

## 2020-11-26 DIAGNOSIS — I2510 Atherosclerotic heart disease of native coronary artery without angina pectoris: Secondary | ICD-10-CM | POA: Diagnosis not present

## 2020-11-26 DIAGNOSIS — R079 Chest pain, unspecified: Secondary | ICD-10-CM | POA: Diagnosis not present

## 2020-11-26 DIAGNOSIS — Z9049 Acquired absence of other specified parts of digestive tract: Secondary | ICD-10-CM | POA: Diagnosis not present

## 2020-11-26 DIAGNOSIS — I700 Atherosclerosis of aorta: Secondary | ICD-10-CM | POA: Diagnosis not present

## 2020-11-26 LAB — HEPATIC FUNCTION PANEL
ALT: 17 U/L (ref 0–44)
AST: 17 U/L (ref 15–41)
Albumin: 3.6 g/dL (ref 3.5–5.0)
Alkaline Phosphatase: 43 U/L (ref 38–126)
Bilirubin, Direct: 0.2 mg/dL (ref 0.0–0.2)
Indirect Bilirubin: 1 mg/dL — ABNORMAL HIGH (ref 0.3–0.9)
Total Bilirubin: 1.2 mg/dL (ref 0.3–1.2)
Total Protein: 6.1 g/dL — ABNORMAL LOW (ref 6.5–8.1)

## 2020-11-26 LAB — TROPONIN I (HIGH SENSITIVITY)
Troponin I (High Sensitivity): 5 ng/L (ref <18)
Troponin I (High Sensitivity): 6 ng/L (ref <18)

## 2020-11-26 LAB — LIPASE, BLOOD: Lipase: 21 U/L (ref 11–51)

## 2020-11-26 MED ORDER — NITROGLYCERIN 0.4 MG SL SUBL
0.4000 mg | SUBLINGUAL_TABLET | SUBLINGUAL | Status: DC | PRN
  Administered 2020-11-26: 0.4 mg via SUBLINGUAL
  Filled 2020-11-26: qty 1

## 2020-11-26 MED ORDER — ISOSORBIDE MONONITRATE ER 30 MG PO TB24: 30.0000 mg | ORAL_TABLET | Freq: Every day | ORAL | 0 refills | Status: DC

## 2020-11-26 MED ORDER — IOHEXOL 350 MG/ML SOLN
100.0000 mL | Freq: Once | INTRAVENOUS | Status: AC
  Administered 2020-11-26: 100 mL via INTRAVENOUS

## 2020-11-26 MED ORDER — ALUM & MAG HYDROXIDE-SIMETH 200-200-20 MG/5ML PO SUSP
30.0000 mL | Freq: Once | ORAL | Status: AC
  Administered 2020-11-26: 30 mL via ORAL
  Filled 2020-11-26: qty 30

## 2020-11-26 NOTE — ED Notes (Signed)
Patient transported to CT 

## 2020-11-26 NOTE — ED Provider Notes (Signed)
[facility] EMERGENCY DEPARTMENT Provider Note   CSN: [id number] Arrival date & time: 11/25/20  1946     History Chief Complaint  Patient presents with  . Chest Pain    Courtney Steele is a 69 y.o. female.  Pt presents to the ED with CP.  CP started last night around 1900.  She was brought by EMS to the ED, but went to the waiting room.  Pt took 1 nitro and 324 mg asa after cp started.  She described the pain as pressure which went to her jaw, back, and down her arms.  She said the pain has been waxing and waning.  She had 2 negative troponins last night at 2000 and 2200.  She waited all night to come back into a room.  She denies sob.  No n/v.  No leg pain or swelling.        Past Medical History:  Diagnosis Date  . Borderline diabetes   . CAD (coronary artery disease)    a. 2012: DES to LAD   . Degenerative joint disease   . Diabetes mellitus   . Fibromyalgia   . Groin hematoma   . Hematoma    Radial catheter site July 13, 2011  . Hyperlipidemia   . Hypertension   . Morbid obesity ([place])   . Osteoarthritis     Patient Active Problem List   Diagnosis Date Noted  . Morbid obesity ([place])   . Hypertension   . Hyperlipidemia   . Degenerative joint disease   . CAD (coronary artery disease)   . Borderline diabetes   . Obesity (BMI 30-39.9) 11/14/2015  . Pain in the chest   . Unstable angina ([place]) 10/28/2015  . Prediabetes 10/28/2015  . Allergy with anaphylaxis due to food 08/08/2015  . Osteoarthritis   . Midsternal chest pain 02/23/2012  . Dizziness 10/29/2011  . CAD S/P LAD DES 2012   . Essential hypertension   . Dyslipidemia, goal LDL below 70   . Fibromyalgia   . Groin hematoma   . Hematoma     Past Surgical History:  Procedure Laterality Date  . ABDOMINAL HYSTERECTOMY    . ANGIOPLASTY    . BUNIONECTOMY    . CARDIAC CATHETERIZATION     revealing patency of the LAD stent with mild compromise of  the diagonal and moderate mid LAD  stenosis, but does not appear to  be high grade.   Marland Kitchen CARDIAC CATHETERIZATION N/A 10/31/2015   Procedure: Left Heart Cath and Coronary Angiography;  Surgeon: Peter M Martinique, MD;  Location: [facility];  Service: Cardiovascular;  Laterality: N/A;  . CARPAL TUNNEL RELEASE    . CATARACT EXTRACTION    . CHOLECYSTECTOMY    . KNEE ARTHROSCOPY    . TONSILLECTOMY       OB History   No obstetric history on file.     Family History  Problem Relation Age of Onset  . Other Mother        died pulmonary issues  . Coronary artery disease Father 83       died- CABG-HTN- AAA  . Breast cancer Maternal Aunt   . Breast cancer Paternal Aunt   . Breast cancer Cousin     Social History   Tobacco Use  . Smoking status: Never Smoker  . Smokeless tobacco: Never Used  Vaping Use  . Vaping Use: Never used  Substance Use Topics  . Alcohol use: Yes    Alcohol/week:  0.0 standard drinks    Comment: occ  . Drug use: No    Home Medications Prior to Admission medications   Medication Sig Start Date End Date Taking? Authorizing Provider  albuterol (PROVENTIL HFA;VENTOLIN HFA) 108 (90 BASE) MCG/ACT inhaler Inhale 1-2 puffs into the lungs every 6 (six) hours as needed for wheezing or shortness of breath. 05/10/15   Debby Freiberg, MD  amLODipine (NORVASC) 2.5 MG tablet Take 1 tablet (2.5 mg total) by mouth daily. 11/18/19   Martinique, Peter M, MD  aspirin EC 81 MG tablet Take 81 mg by mouth daily.    [provider]  atorvastatin (LIPITOR) 40 MG tablet TAKE 1 TABLET(40 MG) BY MOUTH DAILY 11/10/20   Martinique, Peter M, MD  DULoxetine (CYMBALTA) 60 MG capsule TK 1 C PO QD 08/18/18   [provider]  EPINEPHrine 0.3 mg/0.3 mL IJ SOAJ injection Inject 0.3 mLs (0.3 mg total) into the muscle once. 05/10/15   Debby Freiberg, MD  ergocalciferol (VITAMIN D2) 50000 UNITS capsule Take 50,000 Units by mouth once a week. Take on Saturday    [provider]  famotidine (PEPCID) 20 MG tablet Take  1 tablet (20 mg total) by mouth 2 (two) times daily for 10 days. 03/18/20 03/28/20  Corena Herter, PA-C  isosorbide mononitrate (IMDUR) 30 MG 24 hr tablet Take 1 tablet (30 mg total) by mouth daily. 11/26/20   Isla Pence, MD  meloxicam [facility]) 15 MG tablet  07/13/19   [provider]  metoprolol succinate (TOPROL-XL) 25 MG 24 hr tablet TAKE 1 TABLET BY MOUTH DAILY 09/27/20   Martinique, Peter M, MD  nitroGLYCERIN (NITROSTAT) 0.4 MG SL tablet Place 1 tablet (0.4 mg total) under the tongue every 5 (five) minutes as needed. For chest pain 06/13/16   Kerin Ransom K, PA-C  ondansetron (ZOFRAN ODT) 4 MG disintegrating tablet Take 1 tablet (4 mg total) by mouth every 8 (eight) hours as needed for nausea or vomiting. 07/25/16   Horton, Barbette Hair, MD  oxyCODONE-acetaminophen (PERCOCET/ROXICET) 5-325 MG tablet Take 1-2 tablets by mouth every 6 (six) hours as needed for severe pain. 07/25/16   Horton, Barbette Hair, MD  pantoprazole (PROTONIX) 40 MG tablet TAKE 1 TABLET(40 MG) BY MOUTH DAILY 10/25/20   Martinique, Peter M, MD  pregabalin (LYRICA) 75 MG capsule TK 1 C PO BID 10/17/18   [provider]  ranolazine (RANEXA) 1000 MG SR tablet Take 1 tablet (1,000 mg total) by mouth 2 (two) times daily. 11/18/19   Martinique, Peter M, MD  traMADol (ULTRAM) 50 MG tablet Take 50 mg by mouth 3 (three) times daily as needed. For pain    [provider]    Allergies    Beef-derived products, Other, Benazepril, Erythromycin, Indomethacin, Lisinopril, Biaxin [clarithromycin], and Penicillins  Review of Systems   Review of Systems  Cardiovascular: Positive for chest pain.  All other systems reviewed and are negative.   Physical Exam Updated Vital Signs BP 123/70   Pulse 75   Temp 98.6 F (37 C)   Resp 14   SpO2 98%   Physical Exam Vitals and nursing note reviewed.  Constitutional:      Appearance: She is well-developed. She is obese.  HENT:     Head: Normocephalic and atraumatic.  Eyes:      Extraocular Movements: Extraocular movements intact.     Pupils: Pupils are equal, round, and reactive to light.  Cardiovascular:     Rate and Rhythm: Normal rate and regular  rhythm.     Heart sounds: Normal heart sounds.  Pulmonary:     Effort: Pulmonary effort is normal.     Breath sounds: Normal breath sounds.  Abdominal:     General: Bowel sounds are normal.     Palpations: Abdomen is soft.  Musculoskeletal:        General: Normal range of motion.     Cervical back: Normal range of motion and neck supple.  Skin:    General: Skin is warm.     Capillary Refill: Capillary refill takes less than 2 seconds.  Neurological:     General: No focal deficit present.     Mental Status: She is alert and oriented to person, place, and time.  Psychiatric:        Mood and Affect: Mood normal.        Behavior: Behavior normal.     ED Results / Procedures / Treatments   Labs (all labs ordered are listed, but only abnormal results are displayed) Labs Reviewed  BASIC METABOLIC PANEL - Abnormal; Notable for the following components:      Result Value   Glucose, Bld 105 (*)    All other components within normal limits  CBC  HEPATIC FUNCTION PANEL  LIPASE, BLOOD  TROPONIN I (HIGH SENSITIVITY)  TROPONIN I (HIGH SENSITIVITY)  TROPONIN I (HIGH SENSITIVITY)  TROPONIN I (HIGH SENSITIVITY)    EKG EKG Interpretation  Date/Time:  Friday November 25 2020 19:48:49 EST Ventricular Rate:  81 PR Interval:  172 QRS Duration: 92 QT Interval:  400 QTC Calculation: 464 R Axis:   -157 Text Interpretation: Normal sinus rhythm Right superior axis deviation Incomplete right bundle branch block Possible Right ventricular hypertrophy Abnormal ECG No significant change since last tracing Confirmed by Isla Pence [phone]) on 11/26/2020 8:06:03 AM   Radiology DG Chest 2 View  Result Date: 11/26/2020 CLINICAL DATA:  Chest pain EXAM: CHEST - 2 VIEW COMPARISON:  November 11, 2020. FINDINGS: The heart  size and mediastinal contours are within normal limits. Both lungs are clear. No visible pleural effusions or pneumothorax. No acute osseous abnormality. IMPRESSION: No active cardiopulmonary disease. Electronically Signed   By: Margaretha Sheffield MD   On: 11/26/2020 09:35   CT Angio Chest/Abd/Pel for Dissection W and/or Wo Contrast  Result Date: 11/26/2020 CLINICAL DATA:  Acute on side of upper chest pain radiating to jaw and arms. EXAM: CT ANGIOGRAPHY CHEST, ABDOMEN AND PELVIS TECHNIQUE: Non-contrast CT of the chest was initially obtained. Multidetector CT imaging through the chest, abdomen and pelvis was performed using the standard protocol during bolus administration of intravenous contrast. Multiplanar reconstructed images and MIPs were obtained and reviewed to evaluate the vascular anatomy. CONTRAST:  100 mL OMNIPAQUE IOHEXOL 350 MG/ML SOLN COMPARISON:  CT of the abdomen and pelvis on 10/21/2018 FINDINGS: CTA CHEST FINDINGS Cardiovascular: The thoracic aorta is well opacified. There is no evidence of aneurysmal disease or aortic dissection. Mild atherosclerosis at the level of the aortic arch. Visualized proximal great vessels demonstrate some calcified plaque at the left subclavian artery origin without significant stenosis. Other visualized proximal great vessels are normally patent. The heart size is normal. No pericardial fluid identified. There is some visualized calcified plaque in the distribution of the LAD and likely proximal left circumflex. Right coronary artery also is suspected to contain calcified plaque but is blurred by motion. Central pulmonary arteries are of normal caliber and well opacified with no significant pulmonary embolism identified. Mediastinum/Nodes: No enlarged mediastinal, hilar, or axillary  lymph nodes. Thyroid gland, trachea, and esophagus demonstrate no significant findings. Moderate-sized hiatal hernia. Lungs/Pleura: There is no evidence of pulmonary edema,  consolidation, pneumothorax, nodule or pleural fluid. Musculoskeletal: No chest wall abnormality. No acute or significant osseous findings. Review of the MIP images confirms the above findings. CTA ABDOMEN AND PELVIS FINDINGS VASCULAR Aorta: The abdominal aorta is of normal caliber and demonstrates no evidence of aneurysmal disease or dissection. Celiac: Normally patent. The celiac axis supplies the splenic artery and left gastric artery. SMA: Normally patent. Replaced common hepatic artery off of the proximal SMA. Renals: Bilateral single renal arteries demonstrate normal patency. IMA: Normally patent. Inflow: Mild calcified plaque in both common iliac arteries without evidence of obstructive disease or aneurysm. Bilateral internal and external iliac arteries demonstrate normal patency. Common femoral arteries and femoral bifurcations are normally patent bilaterally. Review of the MIP images confirms the above findings. NON-VASCULAR Hepatobiliary: The liver demonstrates diffuse steatosis without overt cirrhosis. The gallbladder has been removed. No biliary ductal dilatation identified. No abnormal hepatic enhancement on the arterial phase of imaging. Pancreas: Unremarkable. No pancreatic ductal dilatation or surrounding inflammatory changes. Spleen: Normal in size without focal abnormality. Adrenals/Urinary Tract: Adrenal glands are unremarkable. Kidneys are normal, without renal calculi, focal lesion, or hydronephrosis. Bladder is unremarkable. Stomach/Bowel: Bowel shows no evidence of obstruction, ileus or inflammation. Diverticulosis of the sigmoid colon and descending colon without evidence of acute diverticulitis. No obvious lesions identified. No evidence of free intraperitoneal air. Lymphatic: No enlarged lymph nodes identified in the abdomen or pelvis. Reproductive: Status post hysterectomy. No adnexal masses. Other: No abdominal wall hernia or abnormality. No abdominopelvic ascites. Musculoskeletal: No  acute or significant osseous findings. Review of the MIP images confirms the above findings. IMPRESSION: 1. No evidence of aortic dissection or other acute findings in the chest, abdomen or pelvis. 2. Coronary atherosclerosis with visualized calcified plaque in a 3 vessel distribution. 3. Moderate-sized hiatal hernia. 4. Hepatic steatosis without overt cirrhosis. 5. Diverticulosis of the sigmoid colon and descending colon without evidence of acute diverticulitis. Electronically Signed   By: Aletta Edouard M.D.   On: 11/26/2020 09:35    Procedures Procedures (including critical care time)  Medications Ordered in ED Medications  nitroGLYCERIN (NITROSTAT) SL tablet 0.4 mg (0.4 mg Sublingual Given 11/26/20 0917)  iohexol (OMNIPAQUE) 350 MG/ML injection 100 mL (100 mLs Intravenous Contrast Given 11/26/20 0847)  alum & mag hydroxide-simeth (MAALOX/MYLANTA) 200-200-20 MG/5ML suspension 30 mL (30 mLs Oral Given 11/26/20 1037)    ED Course  I have reviewed the triage vital signs and the nursing notes.  Pertinent labs & imaging results that were available during my care of the patient were reviewed by me and considered in my medical decision making (see chart for details).    MDM Rules/Calculators/A&P                          Last cath 2016.  Patent arteries and stent.   Nitro did not significantly help pain, but GI cocktail did.  Serial troponins are negative.  CTA for dissection negative.  Heart score of 6, but she has had several negative troponins.  I think she is safe for outpatient follow up.  I spoke with Dr. Harl Bowie who said to increase imdur and continue gi meds.  Pt to f/u with Dr. Martinique.  Return if worse.  Final Clinical Impression(s) / ED Diagnoses Final diagnoses:  Nonspecific chest pain    Rx / DC Orders ED  Discharge Orders         Ordered    isosorbide mononitrate (IMDUR) 30 MG 24 hr tablet  Daily        11/26/20 1116           Isla Pence, MD 11/26/20 1118

## 2020-11-26 NOTE — Discharge Instructions (Addendum)
Increase Imdur to 30 mg daily.  Return if worse.

## 2020-11-26 NOTE — ED Notes (Signed)
Pt given d/c papers, answered all questions during discharge.

## 2020-11-28 ENCOUNTER — Other Ambulatory Visit: Payer: Self-pay

## 2020-11-28 DIAGNOSIS — I2510 Atherosclerotic heart disease of native coronary artery without angina pectoris: Secondary | ICD-10-CM

## 2020-11-28 MED ORDER — RANOLAZINE ER 1000 MG PO TB12: 1000.0000 mg | ORAL_TABLET | Freq: Two times a day (BID) | ORAL | 1 refills | Status: DC

## 2020-12-19 NOTE — Progress Notes (Signed)
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 She has a history of CAD and had an LAD DES in 2012. She had a negative cath in Aug 2012, Sept 2012, and a negative Myoview in April 2014. She [facility] to [facility] on [facility]15 after she had SSCP "pressure" similar to her pre PCI symptoms. She was admitted and ruled out for an MI. Cath done 10/31/15 showed patent LAD stent without significant residual disease. EF was 55-65%. Her Ranexa was increased. Plavix was discontinued later.   She was seen in the ED on 11/26/20 with chest pain. Apparently improved with GI cocktail but she is not sure.  Negative Troponins, Ecg, and CT chest. Her isosorbide dose was doubled. She has continued to have twinges of pain since then but nothing really  Prolonged.     Current Outpatient Medications  Medication Sig Dispense Refill   albuterol (PROVENTIL HFA;VENTOLIN HFA) 108 (90 BASE) MCG/ACT inhaler Inhale 1-2 puffs into the lungs every 6 (six) hours as needed for wheezing or shortness of breath. 1 Inhaler 0   amLODipine (NORVASC) 2.5 MG tablet Take 1 tablet (2.5 mg total) by mouth daily. 90 tablet 3   aspirin EC 81 MG tablet Take 81 mg by mouth daily.     atorvastatin (LIPITOR) 40 MG tablet TAKE 1 TABLET(40 MG) BY MOUTH DAILY 90 tablet 1   DULoxetine (CYMBALTA) 60 MG capsule TK 1 C PO QD  1   empagliflozin (JARDIANCE) 10 MG TABS tablet Take by mouth daily.     EPINEPHrine 0.3 mg/0.3 mL IJ SOAJ injection Inject 0.3 mLs (0.3 mg total) into the muscle once. 1 Device 0   ergocalciferol (VITAMIN D2) 50000 UNITS capsule Take 50,000 Units by mouth once a week. Take on Saturday     isosorbide mononitrate (IMDUR) 30 MG 24 hr tablet Take 1 tablet (30 mg total) by mouth daily. 30 tablet 0   meloxicam (MOBIC) 15 MG tablet      metoprolol succinate (TOPROL-XL) 25 MG 24 hr tablet TAKE 1  TABLET BY MOUTH DAILY 90 tablet 2   ondansetron (ZOFRAN ODT) 4 MG disintegrating tablet Take 1 tablet (4 mg total) by mouth every 8 (eight) hours as needed for nausea or vomiting. 20 tablet 0   pantoprazole (PROTONIX) 40 MG tablet TAKE 1 TABLET(40 MG) BY MOUTH DAILY 90 tablet 1   pregabalin (LYRICA) 75 MG capsule TK 1 C PO BID  4   ranolazine (RANEXA) 1000 MG SR tablet Take 1 tablet (1,000 mg total) by mouth 2 (two) times daily. 90 tablet 1   traMADol (ULTRAM) 50 MG tablet Take 50 mg by mouth 3 (three) times daily as needed. For pain     nitroGLYCERIN (NITROSTAT) 0.4 MG SL tablet Place 1 tablet (0.4 mg total) under the tongue every 5 (five) minutes as needed. For chest pain 25 tablet 3   No current facility-administered medications for this visit.    Allergies  Allergen Reactions   Beef-Derived Products Anaphylaxis   Other Anaphylaxis    No mammal protein substances   Benazepril Cough   Erythromycin Diarrhea   Indomethacin Diarrhea   Lisinopril     cough   Biaxin [Clarithromycin] Rash   Penicillins Rash    Social History   Socioeconomic History   Marital status: Married    Spouse name: Not on file  Number of children: 2   Years of education: Not on file   Highest education level: Not on file  Occupational History   Occupation: URGENT CARE    Employer: [employer]  Tobacco Use   Smoking status: Never Smoker   Smokeless tobacco: Never Used  Vaping Use   Vaping Use: Never used  Substance and Sexual Activity   Alcohol use: Yes    Alcohol/week: 0.0 standard drinks    Comment: occ   Drug use: No   Sexual activity: Not on file  Other Topics Concern   Not on file  Social History Narrative   Not on file   Social Determinants of Health   Financial Resource Strain: Not on file  Food Insecurity: Not on file  Transportation Needs: Not on file  Physical Activity: Not on file  Stress: Not on file  Social Connections: Not on file  Intimate  Partner Violence: Not on file     Review of Systems: As noted in HPI.  All other systems reviewed and are otherwise negative except as noted above.    Blood pressure 130/74, pulse 94, height 5\' 3"  (1.6 m), weight 225 lb 12.8 oz (102.4 kg), SpO2 95 %.  GENERAL:  Well appearing WF in NAD HEENT:  PERRL, EOMI, sclera are clear. Oropharynx is clear. NECK:  No jugular venous distention, carotid upstroke brisk and symmetric, no bruits, no thyromegaly or adenopathy LUNGS:  Clear to auscultation bilaterally CHEST:  Unremarkable HEART:  RRR,  PMI not displaced or sustained,S1 and S2 within normal limits, no S3, no S4: no clicks, no rubs, no murmurs ABD:  Soft, nontender. BS +, no masses or bruits. No hepatomegaly, no splenomegaly EXT:  2 + pulses throughout, no edema, no cyanosis no clubbing SKIN:  Warm and dry.  No rashes NEURO:  Alert and oriented x 3. Cranial nerves II through XII intact. PSYCH:  Cognitively intact   Laboratory data:  Lab Results  Component Value Date   WBC 6.4 11/25/2020   HGB 13.2 11/25/2020   HCT 40.8 11/25/2020   PLT 230 11/25/2020   GLUCOSE 105 (H) 11/25/2020   CHOL 142 10/29/2015   TRIG 207 (H) 10/29/2015   HDL 40 (L) 10/29/2015   LDLCALC 61 10/29/2015   ALT 17 11/26/2020   AST 17 11/26/2020   NA 142 11/25/2020   K 3.8 11/25/2020   CL 103 11/25/2020   CREATININE 0.94 11/25/2020   BUN 19 11/25/2020   CO2 26 11/25/2020   TSH 1.015 07/05/2011   INR 1.10 10/29/2015   HGBA1C 6.3 (H) 07/05/2011    Dated 10/30/16: cholesterol 177, triglycerides 132, HDL 51, LDL 100. A1c 5.8%. Chemistries normal. Dated 05/06/18: cholesterol  183, triglycerides 202, HDL 42, LDL 100. A1c 5.9% Dated 10/15/18: normal CBC and chemistries.  Dated 07/01/19: normal CBC and chemistries.  Dated 05/13/19: cholesterol 151, triglycerides 182, HDL 46, LDL 69. A1c 5.8%.  Dated 11/11/20: A1c 6.7% cholesterol 173, triglycerides 280, HDL 47, LDL 81.   Ecg today shows NSR rate 83. Incomplete  RBBB. No change from prior. I have personally reviewed and interpreted this study.  ASSESSMENT AND PLAN:  1. CAD s/p stenting of the LAD in 2012 with Resolute integrity stent. Repeat cath in November 2016 showed patent stent with nonobstructive disease. She has stable chest pain class 1-2. Continue current medication including beta blocker, nitrates, amlodipine and Ranexa. Given recent symptoms we will update a Lexiscan myoview. 2. HTN- controlled.  3. Dyslipidemia mixed- goal LDL < 70.  Most recent LDL was higher and triglycerides were 280. She has hepatic steatosis on CT. Metabolic syndrome with glucose intolerance. I would first recommend increased attention to lifestyle modification with weight loss. If lipids do not improve would consider increasing lipitor to 80 mg for LDL > 70 and could consider Vascepa for elevated triglycerides. 4. Obesity. Encouraged continued weight loss.  PLAN  Follow up TBD  Zackari Ruane Martinique MD,FACC 12/22/2020 4:10 PM

## 2020-12-22 ENCOUNTER — Ambulatory Visit (INDEPENDENT_AMBULATORY_CARE_PROVIDER_SITE_OTHER): Payer: Medicare Other | Admitting: Cardiology

## 2020-12-22 ENCOUNTER — Other Ambulatory Visit: Payer: Self-pay

## 2020-12-22 ENCOUNTER — Encounter: Payer: Self-pay | Admitting: Cardiology

## 2020-12-22 VITALS — BP 130/74 | HR 94 | Ht 63.0 in | Wt 225.8 lb

## 2020-12-22 DIAGNOSIS — I25119 Atherosclerotic heart disease of native coronary artery with unspecified angina pectoris: Secondary | ICD-10-CM | POA: Diagnosis not present

## 2020-12-22 DIAGNOSIS — E785 Hyperlipidemia, unspecified: Secondary | ICD-10-CM

## 2020-12-22 DIAGNOSIS — I10 Essential (primary) hypertension: Secondary | ICD-10-CM | POA: Diagnosis not present

## 2020-12-22 MED ORDER — NITROGLYCERIN 0.4 MG SL SUBL: 0.4000 mg | SUBLINGUAL_TABLET | SUBLINGUAL | 3 refills | Status: AC | PRN

## 2020-12-22 NOTE — Patient Instructions (Signed)
Medication Instructions:  Continue same medications *If you need a refill on your cardiac medications before your next appointment, please call your pharmacy*   Lab Work: None ordered   Testing/Procedures: Leane Call   Follow-Up: At [facility], you and your health needs are our priority.  As part of our continuing mission to provide you with exceptional heart care, we have created designated Provider Care Teams.  These Care Teams include your primary Cardiologist (physician) and Advanced Practice Providers (APPs -  Physician Assistants and Nurse Practitioners) who all work together to provide you with the care you need, when you need it.  We recommend signing up for the patient portal called "MyChart".  Sign up information is provided on this After Visit Summary.  MyChart is used to connect with patients for Virtual Visits (Telemedicine).  Patients are able to view lab/test results, encounter notes, upcoming appointments, etc.  Non-urgent messages can be sent to your provider as well.   To learn more about what you can do with MyChart, go to [email or web address].    Your next appointment:  1 year   Call in Oct to schedule Jan appointment    The format for your next appointment: Office    Provider: Dr.Jordan

## 2021-01-05 ENCOUNTER — Ambulatory Visit (HOSPITAL_COMMUNITY)
Admission: RE | Admit: 2021-01-05 | Discharge: 2021-01-05 | Disposition: A | Discharge status: Home / Self Care | Payer: Medicare Other | Source: Ambulatory Visit | Attending: Cardiology | Admitting: Cardiology

## 2021-01-05 ENCOUNTER — Other Ambulatory Visit: Payer: Self-pay

## 2021-01-05 DIAGNOSIS — I25119 Atherosclerotic heart disease of native coronary artery with unspecified angina pectoris: Secondary | ICD-10-CM | POA: Insufficient documentation

## 2021-01-05 DIAGNOSIS — E785 Hyperlipidemia, unspecified: Secondary | ICD-10-CM | POA: Insufficient documentation

## 2021-01-05 DIAGNOSIS — I10 Essential (primary) hypertension: Secondary | ICD-10-CM | POA: Diagnosis not present

## 2021-01-05 MED ORDER — AMINOPHYLLINE 25 MG/ML IV SOLN
75.0000 mg | Freq: Once | INTRAVENOUS | Status: AC
  Administered 2021-01-05: 75 mg via INTRAVENOUS

## 2021-01-05 MED ORDER — REGADENOSON 0.4 MG/5ML IV SOLN
0.4000 mg | Freq: Once | INTRAVENOUS | Status: AC
  Administered 2021-01-05: 0.4 mg via INTRAVENOUS

## 2021-01-05 MED ORDER — TECHNETIUM TC 99M TETROFOSMIN IV KIT
25.1000 | PACK | Freq: Once | INTRAVENOUS | Status: AC | PRN
  Administered 2021-01-05: 25.1 via INTRAVENOUS
  Filled 2021-01-05: qty 26

## 2021-01-06 ENCOUNTER — Ambulatory Visit (HOSPITAL_COMMUNITY)
Admission: RE | Admit: 2021-01-06 | Discharge: 2021-01-06 | Disposition: A | Discharge status: Home / Self Care | Payer: Medicare Other | Source: Ambulatory Visit | Attending: Internal Medicine | Admitting: Internal Medicine

## 2021-01-06 LAB — MYOCARDIAL PERFUSION IMAGING
LV dias vol: 63 mL (ref 46–106)
LV sys vol: 18 mL
Peak HR: 83 {beats}/min
Rest HR: 68 {beats}/min
SDS: 2
SRS: 3
SSS: 5
TID: 0.82

## 2021-01-06 MED ORDER — TECHNETIUM TC 99M TETROFOSMIN IV KIT
26.5000 | PACK | Freq: Once | INTRAVENOUS | Status: AC | PRN
  Administered 2021-01-06: 26.5 via INTRAVENOUS

## 2021-01-13 ENCOUNTER — Other Ambulatory Visit: Payer: Self-pay

## 2021-01-13 ENCOUNTER — Telehealth: Payer: Self-pay | Admitting: Cardiology

## 2021-01-13 DIAGNOSIS — I10 Essential (primary) hypertension: Secondary | ICD-10-CM

## 2021-01-13 MED ORDER — ISOSORBIDE MONONITRATE ER 30 MG PO TB24: 30.0000 mg | ORAL_TABLET | Freq: Every day | ORAL | 1 refills | Status: DC

## 2021-01-13 MED ORDER — AMLODIPINE BESYLATE 2.5 MG PO TABS: 2.5000 mg | ORAL_TABLET | Freq: Every day | ORAL | 3 refills | Status: DC

## 2021-01-13 NOTE — Telephone Encounter (Signed)
*  STAT* If patient is at the pharmacy, call can be transferred to refill team.   1. Which medications need to be refilled? (please list name of each medication and dose if known) amLODipine (NORVASC) 2.5 MG tablet  2. Which pharmacy/location (including street and city if local pharmacy) is medication to be sent to? [id number] - [place], [place] - [address]  3. Do they need a 30 day or 90 day supply? 90 day supply  Pt is completely out of medication.

## 2021-02-13 DIAGNOSIS — E1169 Type 2 diabetes mellitus with other specified complication: Secondary | ICD-10-CM | POA: Diagnosis not present

## 2021-02-24 ENCOUNTER — Other Ambulatory Visit: Payer: Self-pay | Admitting: Cardiology

## 2021-02-24 DIAGNOSIS — I2510 Atherosclerotic heart disease of native coronary artery without angina pectoris: Secondary | ICD-10-CM

## 2021-03-07 DIAGNOSIS — H25013 Cortical age-related cataract, bilateral: Secondary | ICD-10-CM | POA: Diagnosis not present

## 2021-03-14 DIAGNOSIS — Z6838 Body mass index (BMI) 38.0-38.9, adult: Secondary | ICD-10-CM | POA: Diagnosis not present

## 2021-03-14 DIAGNOSIS — M2550 Pain in unspecified joint: Secondary | ICD-10-CM | POA: Diagnosis not present

## 2021-03-14 DIAGNOSIS — Z79899 Other long term (current) drug therapy: Secondary | ICD-10-CM | POA: Diagnosis not present

## 2021-03-14 DIAGNOSIS — E669 Obesity, unspecified: Secondary | ICD-10-CM | POA: Diagnosis not present

## 2021-03-14 DIAGNOSIS — M797 Fibromyalgia: Secondary | ICD-10-CM | POA: Diagnosis not present

## 2021-03-14 DIAGNOSIS — M150 Primary generalized (osteo)arthritis: Secondary | ICD-10-CM | POA: Diagnosis not present

## 2021-03-15 NOTE — Telephone Encounter (Signed)
We had talked about adding Vascepa for elevated triglycerides of 280. 4 grams daily. Repeat lipids in 3 months  Carlin Attridge Martinique MD, [facility]

## 2021-03-16 ENCOUNTER — Other Ambulatory Visit: Payer: Self-pay

## 2021-03-16 ENCOUNTER — Other Ambulatory Visit: Payer: Self-pay | Admitting: Family Medicine

## 2021-03-16 DIAGNOSIS — E782 Mixed hyperlipidemia: Secondary | ICD-10-CM

## 2021-03-16 DIAGNOSIS — I25119 Atherosclerotic heart disease of native coronary artery with unspecified angina pectoris: Secondary | ICD-10-CM

## 2021-03-16 DIAGNOSIS — Z1231 Encounter for screening mammogram for malignant neoplasm of breast: Secondary | ICD-10-CM

## 2021-03-16 DIAGNOSIS — E785 Hyperlipidemia, unspecified: Secondary | ICD-10-CM

## 2021-03-16 MED ORDER — ICOSAPENT ETHYL 1 G PO CAPS: ORAL_CAPSULE | ORAL | 3 refills | Status: DC

## 2021-03-16 NOTE — Telephone Encounter (Signed)
Spoke to patient Courtney Steele prescription sent to pharmacy.Advised to have fasting lipid panel in 3 months.Lab order mailed.

## 2021-03-21 DIAGNOSIS — Z23 Encounter for immunization: Secondary | ICD-10-CM | POA: Diagnosis not present

## 2021-04-08 ENCOUNTER — Other Ambulatory Visit: Payer: Self-pay | Admitting: Cardiology

## 2021-04-08 DIAGNOSIS — I10 Essential (primary) hypertension: Secondary | ICD-10-CM

## 2021-04-21 ENCOUNTER — Other Ambulatory Visit: Payer: Self-pay | Admitting: Cardiology

## 2021-05-05 ENCOUNTER — Ambulatory Visit
Admission: RE | Admit: 2021-05-05 | Discharge: 2021-05-05 | Disposition: A | Discharge status: Home / Self Care | Payer: Medicare Other | Source: Ambulatory Visit

## 2021-05-05 ENCOUNTER — Other Ambulatory Visit: Payer: Self-pay

## 2021-05-05 DIAGNOSIS — Z1231 Encounter for screening mammogram for malignant neoplasm of breast: Secondary | ICD-10-CM

## 2021-05-18 ENCOUNTER — Other Ambulatory Visit: Payer: Self-pay | Admitting: Cardiology

## 2021-06-19 DIAGNOSIS — E785 Hyperlipidemia, unspecified: Secondary | ICD-10-CM | POA: Diagnosis not present

## 2021-06-19 DIAGNOSIS — I25119 Atherosclerotic heart disease of native coronary artery with unspecified angina pectoris: Secondary | ICD-10-CM | POA: Diagnosis not present

## 2021-06-19 LAB — LIPID PANEL
Chol/HDL Ratio: 3.6 ratio (ref 0.0–4.4)
Cholesterol, Total: 152 mg/dL (ref 100–199)
HDL: 42 mg/dL (ref >39)
LDL Chol Calc (NIH): 76 mg/dL (ref 0–99)
Triglycerides: 206 mg/dL — ABNORMAL HIGH (ref 0–149)
VLDL Cholesterol Cal: 34 mg/dL (ref 5–40)

## 2021-07-03 DIAGNOSIS — M65311 Trigger thumb, right thumb: Secondary | ICD-10-CM | POA: Diagnosis not present

## 2021-07-03 DIAGNOSIS — M65342 Trigger finger, left ring finger: Secondary | ICD-10-CM | POA: Diagnosis not present

## 2021-07-07 ENCOUNTER — Other Ambulatory Visit: Payer: Self-pay | Admitting: Cardiology

## 2021-07-18 ENCOUNTER — Other Ambulatory Visit: Payer: Self-pay

## 2021-07-18 ENCOUNTER — Encounter (HOSPITAL_BASED_OUTPATIENT_CLINIC_OR_DEPARTMENT_OTHER): Payer: Self-pay

## 2021-07-18 ENCOUNTER — Emergency Department (HOSPITAL_BASED_OUTPATIENT_CLINIC_OR_DEPARTMENT_OTHER)
Admission: EM | Admit: 2021-07-18 | Discharge: 2021-07-18 | Disposition: A | Discharge status: Home / Self Care | Payer: Medicare Other | Attending: Emergency Medicine | Admitting: Emergency Medicine

## 2021-07-18 DIAGNOSIS — E1169 Type 2 diabetes mellitus with other specified complication: Secondary | ICD-10-CM | POA: Insufficient documentation

## 2021-07-18 DIAGNOSIS — L299 Pruritus, unspecified: Secondary | ICD-10-CM | POA: Diagnosis not present

## 2021-07-18 DIAGNOSIS — Z7982 Long term (current) use of aspirin: Secondary | ICD-10-CM | POA: Diagnosis not present

## 2021-07-18 DIAGNOSIS — T782XXA Anaphylactic shock, unspecified, initial encounter: Secondary | ICD-10-CM | POA: Diagnosis not present

## 2021-07-18 DIAGNOSIS — I10 Essential (primary) hypertension: Secondary | ICD-10-CM | POA: Diagnosis not present

## 2021-07-18 DIAGNOSIS — Z79899 Other long term (current) drug therapy: Secondary | ICD-10-CM | POA: Diagnosis not present

## 2021-07-18 DIAGNOSIS — R062 Wheezing: Secondary | ICD-10-CM | POA: Diagnosis not present

## 2021-07-18 DIAGNOSIS — T7840XA Allergy, unspecified, initial encounter: Secondary | ICD-10-CM | POA: Diagnosis not present

## 2021-07-18 DIAGNOSIS — E785 Hyperlipidemia, unspecified: Secondary | ICD-10-CM | POA: Insufficient documentation

## 2021-07-18 DIAGNOSIS — I2510 Atherosclerotic heart disease of native coronary artery without angina pectoris: Secondary | ICD-10-CM | POA: Diagnosis not present

## 2021-07-18 DIAGNOSIS — R0602 Shortness of breath: Secondary | ICD-10-CM | POA: Diagnosis not present

## 2021-07-18 LAB — CBC
HCT: 41.1 % (ref 36.0–46.0)
Hemoglobin: 13.6 g/dL (ref 12.0–15.0)
MCH: 31.1 pg (ref 26.0–34.0)
MCHC: 33.1 g/dL (ref 30.0–36.0)
MCV: 93.8 fL (ref 80.0–100.0)
Platelets: 184 10*3/uL (ref 150–400)
RBC: 4.38 MIL/uL (ref 3.87–5.11)
RDW: 13.7 % (ref 11.5–15.5)
WBC: 7.9 10*3/uL (ref 4.0–10.5)
nRBC: 0 % (ref 0.0–0.2)

## 2021-07-18 LAB — BASIC METABOLIC PANEL
Anion gap: 10 (ref 5–15)
BUN: 25 mg/dL — ABNORMAL HIGH (ref 8–23)
CO2: 28 mmol/L (ref 22–32)
Calcium: 9.1 mg/dL (ref 8.9–10.3)
Chloride: 104 mmol/L (ref 98–111)
Creatinine, Ser: 1.08 mg/dL — ABNORMAL HIGH (ref 0.44–1.00)
GFR, Estimated: 55 mL/min — ABNORMAL LOW (ref >60)
Glucose, Bld: 146 mg/dL — ABNORMAL HIGH (ref 70–99)
Potassium: 3.8 mmol/L (ref 3.5–5.1)
Sodium: 142 mmol/L (ref 135–145)

## 2021-07-18 MED ORDER — DIPHENHYDRAMINE HCL 25 MG PO TABS: 25.0000 mg | ORAL_TABLET | Freq: Four times a day (QID) | ORAL | 0 refills | Status: DC

## 2021-07-18 MED ORDER — METHYLPREDNISOLONE SODIUM SUCC 125 MG IJ SOLR
125.0000 mg | Freq: Once | INTRAMUSCULAR | Status: AC
Start: 2021-07-18 — End: 2021-07-18
  Administered 2021-07-18: 125 mg via INTRAVENOUS
  Filled 2021-07-18: qty 2

## 2021-07-18 MED ORDER — FAMOTIDINE 20 MG PO TABS: 20.0000 mg | ORAL_TABLET | Freq: Every day | ORAL | 0 refills | Status: DC

## 2021-07-18 MED ORDER — FAMOTIDINE IN NACL 20-0.9 MG/50ML-% IV SOLN
20.0000 mg | Freq: Once | INTRAVENOUS | Status: AC
Start: 2021-07-18 — End: 2021-07-18
  Administered 2021-07-18: 20 mg via INTRAVENOUS
  Filled 2021-07-18: qty 50

## 2021-07-18 MED ORDER — ALBUTEROL (5 MG/ML) CONTINUOUS INHALATION SOLN
10.0000 mg/h | INHALATION_SOLUTION | RESPIRATORY_TRACT | Status: DC
  Administered 2021-07-18: 10 mg/h via RESPIRATORY_TRACT
  Filled 2021-07-18: qty 20

## 2021-07-18 MED ORDER — IPRATROPIUM-ALBUTEROL 0.5-2.5 (3) MG/3ML IN SOLN
3.0000 mL | RESPIRATORY_TRACT | Status: AC
  Administered 2021-07-18 (×3): 3 mL via RESPIRATORY_TRACT
  Filled 2021-07-18: qty 9

## 2021-07-18 MED ORDER — DIPHENHYDRAMINE HCL 50 MG/ML IJ SOLN
25.0000 mg | Freq: Once | INTRAMUSCULAR | Status: AC
Start: 2021-07-18 — End: 2021-07-18
  Administered 2021-07-18: 25 mg via INTRAVENOUS
  Filled 2021-07-18: qty 1

## 2021-07-18 MED ORDER — PREDNISONE 10 MG PO TABS: 40.0000 mg | ORAL_TABLET | Freq: Every day | ORAL | 0 refills | Status: AC

## 2021-07-18 NOTE — ED Provider Notes (Signed)
[place] EMERGENCY DEPT Provider Note  CSN: [id number] Arrival date & time: 07/18/21 0222  Chief Complaint(s) Allergic Reaction (Allergic reaction to pork)  HPI Courtney Steele is a 70 y.o. female with h/o Alpha gal ate sausage that had pork in it this evening. Started having upper abd pain and a bout of NBNB emesis, the developed SOB which progressed.   Allergic Reaction Presenting symptoms: difficulty breathing, itching and wheezing   Difficulty breathing:    Severity:  Moderate   Onset quality:  Gradual   Timing:  Constant   Progression:  Worsening Severity:  Moderate Duration: 2-3 hours. Context: food (pork)   Relieved by:  None tried Worsened by:  Nothing  Past Medical History Past Medical History:  Diagnosis Date   Borderline diabetes    CAD (coronary artery disease)    a. 2012: DES to LAD    Degenerative joint disease    Diabetes mellitus    Fibromyalgia    Groin hematoma    Hematoma    Radial catheter site July 13, 2011   Hyperlipidemia    Hypertension    Morbid obesity ([place])    Osteoarthritis    Patient Active Problem List   Diagnosis Date Noted   Morbid obesity ([place])    Hypertension    Hyperlipidemia    Degenerative joint disease    CAD (coronary artery disease)    Borderline diabetes    Obesity (BMI 30-39.9) 11/14/2015   Pain in the chest    Unstable angina ([place]) 10/28/2015   Prediabetes 10/28/2015   Allergy with anaphylaxis due to food 08/08/2015   Osteoarthritis    Midsternal chest pain 02/23/2012   Dizziness 10/29/2011   CAD S/P LAD DES 2012    Essential hypertension    Dyslipidemia, goal LDL below 70    Fibromyalgia    Groin hematoma    Hematoma    Home Medication(s) Prior to Admission medications   Medication Sig Start Date End Date Taking? Authorizing Provider  amLODipine (NORVASC) 2.5 MG tablet Take 1 tablet (2.5 mg total) by mouth daily. 01/13/21  Yes Martinique, Peter M, MD  aspirin EC 81 MG tablet Take 81 mg by mouth  daily.   Yes [provider]  atorvastatin (LIPITOR) 40 MG tablet TAKE 1 TABLET(40 MG) BY MOUTH DAILY 05/19/21  Yes Martinique, Peter M, MD  diphenhydrAMINE (BENADRYL) 25 MG tablet Take 1 tablet (25 mg total) by mouth every 6 (six) hours for 5 days. 07/18/21 07/23/21 Yes Unice Vantassel, Grayce Sessions, MD  DULoxetine (CYMBALTA) 60 MG capsule TK 1 C PO QD 08/18/18  Yes [provider]  famotidine (PEPCID) 20 MG tablet Take 1 tablet (20 mg total) by mouth daily for 5 days. 07/18/21 07/23/21 Yes Shaquana Buel, Grayce Sessions, MD  isosorbide mononitrate (IMDUR) 30 MG 24 hr tablet TAKE 1 TABLET(30 MG) BY MOUTH DAILY 07/07/21  Yes Martinique, Peter M, MD  pantoprazole (PROTONIX) 40 MG tablet TAKE 1 TABLET(40 MG) BY MOUTH DAILY 04/21/21  Yes Martinique, Peter M, MD  predniSONE (DELTASONE) 10 MG tablet Take 4 tablets (40 mg total) by mouth daily for 4 days. 07/18/21 07/22/21 Yes Kinley Ferrentino, Grayce Sessions, MD  pregabalin (LYRICA) 75 MG capsule TK 1 C PO BID 10/17/18  Yes [provider]  albuterol (PROVENTIL HFA;VENTOLIN HFA) 108 (90 BASE) MCG/ACT inhaler Inhale 1-2 puffs into the lungs every 6 (six) hours as needed for wheezing or shortness of breath. 05/10/15   Debby Freiberg, MD  empagliflozin (JARDIANCE) 10 MG TABS tablet Take  by mouth daily.    [provider]  EPINEPHrine 0.3 mg/0.3 mL IJ SOAJ injection Inject 0.3 mLs (0.3 mg total) into the muscle once. 05/10/15   Debby Freiberg, MD  ergocalciferol (VITAMIN D2) 50000 UNITS capsule Take 50,000 Units by mouth once a week. Take on Saturday    [provider]  icosapent Ethyl (VASCEPA) 1 g capsule Take 2 capsules twice a day 03/16/21   Martinique, Peter M, MD  meloxicam [facility]) 15 MG tablet  07/13/19   [provider]  metoprolol succinate (TOPROL-XL) 25 MG 24 hr tablet TAKE 1 TABLET BY MOUTH DAILY 04/10/21   Martinique, Peter M, MD  nitroGLYCERIN (NITROSTAT) 0.4 MG SL tablet Place 1 tablet (0.4 mg total) under the tongue every 5 (five) minutes as needed. For  chest pain 12/22/20   Martinique, Peter M, MD  ondansetron (ZOFRAN ODT) 4 MG disintegrating tablet Take 1 tablet (4 mg total) by mouth every 8 (eight) hours as needed for nausea or vomiting. 07/25/16   Horton, Barbette Hair, MD  ranolazine (RANEXA) 1000 MG SR tablet TAKE 1 TABLET(1000 MG) BY MOUTH TWICE DAILY 02/27/21   Martinique, Peter M, MD  traMADol (ULTRAM) 50 MG tablet Take 50 mg by mouth 3 (three) times daily as needed. For pain    [provider]                                                                                                                                    Past Surgical History Past Surgical History:  Procedure Laterality Date   ABDOMINAL HYSTERECTOMY     ANGIOPLASTY     BUNIONECTOMY     CARDIAC CATHETERIZATION     revealing patency of the LAD stent with mild compromise of  the diagonal and moderate mid LAD stenosis, but does not appear to  be high grade.    CARDIAC CATHETERIZATION N/A 10/31/2015   Procedure: Left Heart Cath and Coronary Angiography;  Surgeon: Peter M Martinique, MD;  Location: [facility];  Service: Cardiovascular;  Laterality: N/A;   CARPAL TUNNEL RELEASE     CATARACT EXTRACTION     CHOLECYSTECTOMY     KNEE ARTHROSCOPY     TONSILLECTOMY     Family History Family History  Problem Relation Age of Onset   Other Mother        died pulmonary issues   Coronary artery disease Father 20       died- CABG-HTN- AAA   Breast cancer Maternal Aunt        >50   Breast cancer Cousin 43    Social History Social History   Tobacco Use   Smoking status: Never   Smokeless tobacco: Never  Vaping Use   Vaping Use: Never used  Substance Use Topics   Alcohol use: Yes    Alcohol/week: 0.0 standard drinks    Comment: occ   Drug use: No  Allergies Beef-derived products, Other, Benazepril, Erythromycin, Indomethacin, Lisinopril, Biaxin [clarithromycin], and Penicillins  Review of Systems Review of Systems  Respiratory:  Positive for wheezing.    Skin:  Positive for itching.  All other systems are reviewed and are negative for acute change except as noted in the HPI  Physical Exam Vital Signs  I have reviewed the triage vital signs BP (!) 97/51   Pulse 84   Temp 98.9 F (37.2 C) (Oral)   Resp 16   Ht '5\' 3"'$  (1.6 m)   Wt 99.8 kg   SpO2 90%   BMI 38.97 kg/m   Physical Exam Vitals reviewed.  Constitutional:      General: She is not in acute distress.    Appearance: She is well-developed. She is not diaphoretic.  HENT:     Head: Normocephalic and atraumatic.     Nose: Nose normal.  Eyes:     General: No scleral icterus.       Right eye: No discharge.        Left eye: No discharge.     Conjunctiva/sclera: Conjunctivae normal.     Pupils: Pupils are equal, round, and reactive to light.  Cardiovascular:     Rate and Rhythm: Normal rate and regular rhythm.     Heart sounds: No murmur heard.   No friction rub. No gallop.  Pulmonary:     Effort: Pulmonary effort is normal. No respiratory distress.     Breath sounds: Decreased air movement present. No stridor. Wheezing (exp wheezing throughout) present. No rales.  Abdominal:     General: There is no distension.     Palpations: Abdomen is soft.     Tenderness: There is no abdominal tenderness.  Musculoskeletal:        General: No tenderness.     Cervical back: Normal range of motion and neck supple.  Skin:    General: Skin is warm and dry.     Findings: No erythema or rash.  Neurological:     Mental Status: She is alert and oriented to person, place, and time.    ED Results and Treatments Labs (all labs ordered are listed, but only abnormal results are displayed) Labs Reviewed  BASIC METABOLIC PANEL - Abnormal; Notable for the following components:      Result Value   Glucose, Bld 146 (*)    BUN 25 (*)    Creatinine, Ser 1.08 (*)    GFR, Estimated 55 (*)    All other components within normal limits  CBC                                                                                                                          EKG  EKG Interpretation  Date/Time:    Ventricular Rate:    PR Interval:    QRS Duration:   QT Interval:    QTC Calculation:   R Axis:     Text Interpretation:  Radiology No results found.  Pertinent labs & imaging results that were available during my care of the patient were reviewed by me and considered in my medical decision making (see MDM for details).  Medications Ordered in ED Medications  albuterol (PROVENTIL,VENTOLIN) solution continuous neb (10 mg/hr Nebulization New Bag/Given 07/18/21 0256)  diphenhydrAMINE (BENADRYL) injection 25 mg (25 mg Intravenous Given 07/18/21 0249)  methylPREDNISolone sodium succinate (SOLU-MEDROL) 125 mg/2 mL injection 125 mg (125 mg Intravenous Given 07/18/21 0250)  famotidine (PEPCID) IVPB 20 mg premix (20 mg Intravenous New Bag/Given 07/18/21 0307)  ipratropium-albuterol (DUONEB) 0.5-2.5 (3) MG/3ML nebulizer solution 3 mL (3 mLs Nebulization Given 07/18/21 0522)                                                                                                                                     Procedures .1-3 Lead EKG Interpretation  Date/Time: 07/18/2021 6:32 AM Performed by: Fatima Blank, MD Authorized by: Fatima Blank, MD     Interpretation: normal     ECG rate:  77   ECG rate assessment: normal     Rhythm: sinus rhythm     Ectopy: none     Conduction: normal   .Critical Care  Date/Time: 07/18/2021 6:32 AM Performed by: Fatima Blank, MD Authorized by: Fatima Blank, MD   Critical care provider statement:    Critical care time (minutes):  45   Critical care was necessary to treat or prevent imminent or life-threatening deterioration of the following conditions:  Respiratory failure   Critical care was time spent personally by me on the following activities:  Discussions with consultants, evaluation of patient's response to  treatment, examination of patient, ordering and performing treatments and interventions, ordering and review of laboratory studies, ordering and review of radiographic studies, pulse oximetry, re-evaluation of patient's condition, obtaining history from patient or surrogate and review of old charts  (including critical care time)  Medical Decision Making / ED Course I have reviewed the nursing notes for this encounter and the patient's prior records (if available in EHR or on provided paperwork).  BESSIE BUIKEMA was evaluated in Emergency Department on 07/18/2021 for the symptoms described in the history of present illness. She was evaluated in the context of the global COVID-19 pandemic, which necessitated consideration that the patient might be at risk for infection with the SARS-CoV-2 virus that causes COVID-19. Institutional protocols and algorithms that pertain to the evaluation of patients at risk for COVID-19 are in a state of rapid change based on information released by regulatory bodies including the CDC and federal and state organizations. These policies and algorithms were followed during the patient's care in the ED.     Patient here with shortness of breath and itching related to pork consumption. History of alpha gal syndrome. No medication taken at home. Patient with mild tachypnea and decreased air movement with wheezing throughout, satting 93% on  room air. No angioedema. Given Benadryl, Pepcid and Solu-Medrol. Continuous albuterol neb. Screening labs ordered. Close monitoring.  Pertinent labs & imaging results that were available during my care of the patient were reviewed by me and considered in my medical decision making:  5:14 AM No leukocytosis or anemia No significant electrolyte derangements or renal sufficiency  On reassessment patient said still mildly low with decreased air movement. Given additional DuoNeb.   6:33 AM Patient now satting 95% with improved air  movement. Able to ambulate without complication.  Final Clinical Impression(s) / ED Diagnoses Final diagnoses:  Allergic reaction, initial encounter   The patient appears reasonably screened and/or stabilized for discharge and I doubt any other medical condition or other [facility] requiring further screening, evaluation, or treatment in the ED at this time prior to discharge. Safe for discharge with strict return precautions.  Disposition: Discharge  Condition: Good  I have discussed the results, Dx and Tx plan with the patient/family who expressed understanding and agree(s) with the plan. Discharge instructions discussed at length. The patient/family was given strict return precautions who verbalized understanding of the instructions. No further questions at time of discharge.    ED Discharge Orders          Ordered    predniSONE (DELTASONE) 10 MG tablet  Daily        07/18/21 0632    famotidine (PEPCID) 20 MG tablet  Daily        07/18/21 0632    diphenhydrAMINE (BENADRYL) 25 MG tablet  Every 6 hours        07/18/21 [id number]             Follow Up: Kelton Pillar, MD [address] [place] [address] [phone]  Call  as needed     This chart was dictated using voice recognition software.  Despite best efforts to proofread,  errors can occur which can change the documentation meaning.    Fatima Blank, MD 07/18/21 [phone]

## 2021-07-18 NOTE — ED Triage Notes (Signed)
Pt states she is having an allergic reaction to pork. Started an hour ago. Coughing, wheezing itching.

## 2021-08-18 DIAGNOSIS — M65342 Trigger finger, left ring finger: Secondary | ICD-10-CM | POA: Diagnosis not present

## 2021-08-18 DIAGNOSIS — M65311 Trigger thumb, right thumb: Secondary | ICD-10-CM | POA: Diagnosis not present

## 2021-09-05 DIAGNOSIS — Z23 Encounter for immunization: Secondary | ICD-10-CM | POA: Diagnosis not present

## 2021-09-14 DIAGNOSIS — E669 Obesity, unspecified: Secondary | ICD-10-CM | POA: Diagnosis not present

## 2021-09-14 DIAGNOSIS — M25551 Pain in right hip: Secondary | ICD-10-CM | POA: Diagnosis not present

## 2021-09-14 DIAGNOSIS — M150 Primary generalized (osteo)arthritis: Secondary | ICD-10-CM | POA: Diagnosis not present

## 2021-09-14 DIAGNOSIS — M797 Fibromyalgia: Secondary | ICD-10-CM | POA: Diagnosis not present

## 2021-09-14 DIAGNOSIS — Z79899 Other long term (current) drug therapy: Secondary | ICD-10-CM | POA: Diagnosis not present

## 2021-09-14 DIAGNOSIS — M7061 Trochanteric bursitis, right hip: Secondary | ICD-10-CM | POA: Diagnosis not present

## 2021-09-14 DIAGNOSIS — M2550 Pain in unspecified joint: Secondary | ICD-10-CM | POA: Diagnosis not present

## 2021-09-14 DIAGNOSIS — Z6838 Body mass index (BMI) 38.0-38.9, adult: Secondary | ICD-10-CM | POA: Diagnosis not present

## 2021-09-14 DIAGNOSIS — M25552 Pain in left hip: Secondary | ICD-10-CM | POA: Diagnosis not present

## 2021-09-14 DIAGNOSIS — M7062 Trochanteric bursitis, left hip: Secondary | ICD-10-CM | POA: Diagnosis not present

## 2021-09-23 DIAGNOSIS — Z20822 Contact with and (suspected) exposure to covid-19: Secondary | ICD-10-CM | POA: Diagnosis not present

## 2021-10-14 ENCOUNTER — Other Ambulatory Visit: Payer: Self-pay | Admitting: Cardiology

## 2021-10-18 ENCOUNTER — Ambulatory Visit: Payer: Medicare Other | Admitting: Dietician

## 2021-10-23 ENCOUNTER — Emergency Department (INDEPENDENT_AMBULATORY_CARE_PROVIDER_SITE_OTHER)
Admission: EM | Admit: 2021-10-23 | Discharge: 2021-10-23 | Disposition: A | Discharge status: Home / Self Care | Payer: Medicare Other | Source: Home / Self Care

## 2021-10-23 DIAGNOSIS — Z23 Encounter for immunization: Secondary | ICD-10-CM

## 2021-10-23 DIAGNOSIS — S61213A Laceration without foreign body of left middle finger without damage to nail, initial encounter: Secondary | ICD-10-CM | POA: Diagnosis not present

## 2021-10-23 MED ORDER — DOXYCYCLINE HYCLATE 100 MG PO CAPS: 100.0000 mg | ORAL_CAPSULE | Freq: Two times a day (BID) | ORAL | 0 refills | Status: AC

## 2021-10-23 MED ORDER — TETANUS-DIPHTH-ACELL PERTUSSIS 5-2.5-18.5 LF-MCG/0.5 IM SUSY
0.5000 mL | PREFILLED_SYRINGE | Freq: Once | INTRAMUSCULAR | Status: AC
  Administered 2021-10-23: 0.5 mL via INTRAMUSCULAR

## 2021-10-23 NOTE — ED Provider Notes (Signed)
[name]    CSN: [id number] Arrival date & time: 10/23/21  1209      History   Chief Complaint Chief Complaint  Patient presents with   Laceration    LT middle finger    HPI Courtney Steele is a 70 y.o. female.   HPI 70 year old female presents with left middle finger laceration at roughly 11 AM this morning patient reports cutting on part of blender blade.  Past Medical History:  Diagnosis Date   Borderline diabetes    CAD (coronary artery disease)    a. 2012: DES to LAD    Degenerative joint disease    Diabetes mellitus    Fibromyalgia    Groin hematoma    Hematoma    Radial catheter site July 13, 2011   Hyperlipidemia    Hypertension    Morbid obesity ([place])    Osteoarthritis     Patient Active Problem List   Diagnosis Date Noted   Morbid obesity ([place])    Hypertension    Hyperlipidemia    Degenerative joint disease    CAD (coronary artery disease)    Borderline diabetes    Obesity (BMI 30-39.9) 11/14/2015   Pain in the chest    Unstable angina ([place]) 10/28/2015   Prediabetes 10/28/2015   Allergy with anaphylaxis due to food 08/08/2015   Osteoarthritis    Midsternal chest pain 02/23/2012   Dizziness 10/29/2011   CAD S/P LAD DES 2012    Essential hypertension    Dyslipidemia, goal LDL below 70    Fibromyalgia    Groin hematoma    Hematoma     Past Surgical History:  Procedure Laterality Date   ABDOMINAL HYSTERECTOMY     ANGIOPLASTY     BUNIONECTOMY     CARDIAC CATHETERIZATION     revealing patency of the LAD stent with mild compromise of  the diagonal and moderate mid LAD stenosis, but does not appear to  be high grade.    CARDIAC CATHETERIZATION N/A 10/31/2015   Procedure: Left Heart Cath and Coronary Angiography;  Surgeon: Peter M Martinique, MD;  Location: [place] [facility];  Service: Cardiovascular;  Laterality: N/A;   CARPAL TUNNEL RELEASE     CATARACT EXTRACTION     CHOLECYSTECTOMY     KNEE ARTHROSCOPY     TONSILLECTOMY       OB History   No obstetric history on file.      Home Medications    Prior to Admission medications   Medication Sig Start Date End Date Taking? Authorizing Provider  doxycycline (VIBRAMYCIN) 100 MG capsule Take 1 capsule (100 mg total) by mouth 2 (two) times daily for 5 days. 10/23/21 10/28/21 Yes Eliezer Lofts, FNP  doxycycline (VIBRAMYCIN) 100 MG capsule Take 1 capsule (100 mg total) by mouth 2 (two) times daily for 5 days. 10/23/21 10/28/21 Yes Eliezer Lofts, FNP  albuterol (PROVENTIL HFA;VENTOLIN HFA) 108 (90 BASE) MCG/ACT inhaler Inhale 1-2 puffs into the lungs every 6 (six) hours as needed for wheezing or shortness of breath. 05/10/15   Debby Freiberg, MD  amLODipine (NORVASC) 2.5 MG tablet Take 1 tablet (2.5 mg total) by mouth daily. 01/13/21   Martinique, Peter M, MD  aspirin EC 81 MG tablet Take 81 mg by mouth daily.    [provider]  atorvastatin (LIPITOR) 40 MG tablet TAKE 1 TABLET(40 MG) BY MOUTH DAILY 05/19/21   Martinique, Peter M, MD  diphenhydrAMINE (BENADRYL) 25 MG tablet Take 1 tablet (25 mg  total) by mouth every 6 (six) hours for 5 days. 07/18/21 07/23/21  Fatima Blank, MD  DULoxetine (CYMBALTA) 60 MG capsule TK 1 C PO QD 08/18/18   [provider]  empagliflozin (JARDIANCE) 10 MG TABS tablet Take by mouth daily.    [provider]  EPINEPHrine 0.3 mg/0.3 mL IJ SOAJ injection Inject 0.3 mLs (0.3 mg total) into the muscle once. 05/10/15   Debby Freiberg, MD  ergocalciferol (VITAMIN D2) 50000 UNITS capsule Take 50,000 Units by mouth once a week. Take on Saturday    [provider]  famotidine (PEPCID) 20 MG tablet Take 1 tablet (20 mg total) by mouth daily for 5 days. 07/18/21 07/23/21  Fatima Blank, MD  icosapent Ethyl (VASCEPA) 1 g capsule Take 2 capsules twice a day 03/16/21   Martinique, Peter M, MD  isosorbide mononitrate (IMDUR) 30 MG 24 hr tablet TAKE 1 TABLET(30 MG) BY MOUTH DAILY 07/07/21   Martinique, Peter M, MD  meloxicam  [facility]) 15 MG tablet  07/13/19   [provider]  metoprolol succinate (TOPROL-XL) 25 MG 24 hr tablet TAKE 1 TABLET BY MOUTH DAILY 04/10/21   Martinique, Peter M, MD  nitroGLYCERIN (NITROSTAT) 0.4 MG SL tablet Place 1 tablet (0.4 mg total) under the tongue every 5 (five) minutes as needed. For chest pain 12/22/20   Martinique, Peter M, MD  ondansetron (ZOFRAN ODT) 4 MG disintegrating tablet Take 1 tablet (4 mg total) by mouth every 8 (eight) hours as needed for nausea or vomiting. 07/25/16   Horton, Barbette Hair, MD  pantoprazole (PROTONIX) 40 MG tablet TAKE 1 TABLET(40 MG) BY MOUTH DAILY 10/16/21   Martinique, Peter M, MD  pregabalin (LYRICA) 75 MG capsule TK 1 C PO BID 10/17/18   [provider]  ranolazine (RANEXA) 1000 MG SR tablet TAKE 1 TABLET(1000 MG) BY MOUTH TWICE DAILY 02/27/21   Martinique, Peter M, MD  traMADol (ULTRAM) 50 MG tablet Take 50 mg by mouth 3 (three) times daily as needed. For pain    [provider]    Family History Family History  Problem Relation Age of Onset   Other Mother        died pulmonary issues   Coronary artery disease Father 70       died- CABG-HTN- AAA   Breast cancer Maternal Aunt        >50   Breast cancer Cousin 87    Social History Social History   Tobacco Use   Smoking status: Never   Smokeless tobacco: Never  Vaping Use   Vaping Use: Never used  Substance Use Topics   Alcohol use: Yes    Alcohol/week: 0.0 standard drinks    Comment: occ   Drug use: No     Allergies   Beef-derived products, Other, Benazepril, Erythromycin, Indomethacin, Lisinopril, Biaxin [clarithromycin], and Penicillins   Review of Systems Review of Systems  Skin:  Positive for wound.    Physical Exam Triage Vital Signs ED Triage Vitals  Enc Vitals Group     BP 10/23/21 1224 112/77     Pulse Rate 10/23/21 1224 76     Resp 10/23/21 1224 17     Temp 10/23/21 1224 99.5 F (37.5 C)     Temp Source 10/23/21 1224 Oral     SpO2 10/23/21 1224 97 %      Weight --      Height --      Head Circumference --      Peak  Flow --      Pain Score 10/23/21 1220 1     Pain Loc --      Pain Edu? --      Excl. in [place]? --    No data found.  Updated Vital Signs BP 112/77 (BP Location: Right Arm)   Pulse 76   Temp 99.5 F (37.5 C) (Oral)   Resp 17   SpO2 97%    Physical Exam Vitals and nursing note reviewed.  Constitutional:      General: She is not in acute distress.    Appearance: She is obese. She is not ill-appearing.  HENT:     Head: Normocephalic and atraumatic.     Mouth/Throat:     Mouth: Mucous membranes are moist.     Pharynx: Oropharynx is clear.  Eyes:     Extraocular Movements: Extraocular movements intact.     Pupils: Pupils are equal, round, and reactive to light.  Cardiovascular:     Rate and Rhythm: Normal rate and regular rhythm.     Pulses: Normal pulses.     Heart sounds: Normal heart sounds.  Pulmonary:     Effort: Pulmonary effort is normal.     Breath sounds: Normal breath sounds.  Musculoskeletal:        General: Normal range of motion.  Skin:    General: Skin is warm and dry.  Neurological:     General: No focal deficit present.     Mental Status: She is alert and oriented to person, place, and time.     UC Treatments / Results  Labs (all labs ordered are listed, but only abnormal results are displayed) Labs Reviewed - No data to display  EKG   Radiology No results found.  Procedures Laceration Repair  Date/Time: 10/23/2021 1:48 PM Performed by: Eliezer Lofts, FNP Authorized by: Eliezer Lofts, FNP   Consent:    Consent obtained:  Verbal   Consent given by:  Patient   Risks discussed:  Infection, need for additional repair, pain, poor cosmetic result and poor wound healing   Alternatives discussed:  No treatment and delayed treatment Universal protocol:    Procedure explained and questions answered to patient or proxy's satisfaction: yes     Relevant documents present and verified:  yes     Test results available: yes     Imaging studies available: yes     Required blood products, implants, devices, and special equipment available: yes     Site/side marked: yes     Immediately prior to procedure, a time out was called: yes     Patient identity confirmed:  Verbally with patient Anesthesia:    Anesthesia method:  Local infiltration and nerve block   Block location:  Proximal phalanx of left index finger dorsal digital nerve/volar digital nerve bilaterally   Block needle gauge:  24 G   Block anesthetic:  Lidocaine 2% w/o epi Laceration details:    Location:  Finger   Finger location:  L long finger   Length (cm):  0.5   Depth (mm):  2 Pre-procedure details:    Preparation:  Patient was prepped and draped in usual sterile fashion Exploration:    Limited defect created (wound extended): no     Hemostasis achieved with:  Direct pressure   Imaging outcome: foreign body not noted     Wound exploration: wound explored through full range of motion     Contaminated: no   Treatment:    Area cleansed with:  Shur-Clens Skin repair:    Repair method:  Sutures   Suture size:  5-0   Suture material:  Prolene   Suture technique:  Simple interrupted   Number of sutures:  3 Approximation:    Approximation:  Close Repair type:    Repair type:  Simple Post-procedure details:    Procedure completion:  Tolerated well, no immediate complications (including critical care time)  Medications Ordered in UC Medications  Tdap (BOOSTRIX) injection 0.5 mL (0.5 mLs Intramuscular Given 10/23/21 1228)    Initial Impression / Assessment and Plan / UC Course  I have reviewed the triage vital signs and the nursing notes.  Pertinent labs & imaging results that were available during my care of the patient were reviewed by me and considered in my medical decision making (see chart for details).     MDM: 1.  Laceration of left middle finger without foreign body without damage to nail,  initial encounter-laceration repair performed, Rx'd Doxycycline, Tdap given prior to discharge. Advised/instructed patient to take medication as directed with food to completion.  Advised patient to keep wound area dry and clean for the next 36 hours.  Advised may get wet afterwards; however, do not submerge hand completely in water.  Return to clinic in 7 days for suture removal.  Patient discharged home, hemodynamically stable. Final Clinical Impressions(s) / UC Diagnoses   Final diagnoses:  Laceration of left middle finger without foreign body without damage to nail, initial encounter     Discharge Instructions      Advised/instructed patient to take medication as directed with food to completion.  Advised patient to keep wound area dry and clean for the next 36 hours.  Advised may get wet afterwards; however, do not submerge hand completely in water.  Return to clinic in 7 days for suture removal.     ED Prescriptions     Medication Sig Dispense Auth. Provider   doxycycline (VIBRAMYCIN) 100 MG capsule Take 1 capsule (100 mg total) by mouth 2 (two) times daily for 5 days. 10 capsule Eliezer Lofts, FNP   doxycycline (VIBRAMYCIN) 100 MG capsule Take 1 capsule (100 mg total) by mouth 2 (two) times daily for 5 days. 10 capsule Eliezer Lofts, FNP      PDMP not reviewed this encounter.   Eliezer Lofts, [place] 10/23/21 1352

## 2021-10-23 NOTE — Discharge Instructions (Addendum)
Advised/instructed patient to take medication as directed with food to completion.  Advised patient to keep wound area dry and clean for the next 36 hours.  Advised may get wet afterwards; however, do not submerge hand completely in water.  Return to clinic in 7 days for suture removal.

## 2021-10-23 NOTE — ED Triage Notes (Signed)
Pt c/o laceration to LT middle finger at about 11 am. Cut on part of a blender blade. Unsure if tdap if up to date. Pain 1/10

## 2021-11-13 DIAGNOSIS — M7061 Trochanteric bursitis, right hip: Secondary | ICD-10-CM | POA: Diagnosis not present

## 2021-11-13 DIAGNOSIS — M25551 Pain in right hip: Secondary | ICD-10-CM | POA: Insufficient documentation

## 2021-11-14 ENCOUNTER — Other Ambulatory Visit: Payer: Self-pay

## 2021-11-14 ENCOUNTER — Encounter: Payer: Medicare Other | Attending: Family Medicine | Admitting: Dietician

## 2021-11-14 ENCOUNTER — Ambulatory Visit: Payer: Medicare Other | Admitting: Dietician

## 2021-11-14 ENCOUNTER — Encounter: Payer: Self-pay | Admitting: Dietician

## 2021-11-14 VITALS — Ht 63.0 in | Wt 220.8 lb

## 2021-11-14 DIAGNOSIS — E119 Type 2 diabetes mellitus without complications: Secondary | ICD-10-CM | POA: Diagnosis not present

## 2021-11-14 NOTE — Patient Instructions (Addendum)
ALL restaurants that have 20 or more locations have to post their nutrition information online. You can Google this information at any point!  Work towards eating three meals a day, about 5-6 hours apart!  Have a balanced snack of 1 serving of carbs and 1 serving of protein if hungry.  Begin to recognize carbohydrates, protein, and non-starchy vegetables in your food choices!  Begin to build your meals using the proportions of the Balanced Plate. First, select your carb choice(s) for the meal. Next, select your source of protein to pair with your carb choice(s). Finally, complete the remaining half of your meal with a variety of non-starchy vegetables.  Check your blood sugar each morning before eating or drinking (fasting). Look for numbers between 70-100 mg/dL Check your blood sugar 2 hours after you begin eating a meal. Look for numbers under 180 mg/dL at all times.  Your goal A1c is below 6.5%

## 2021-11-14 NOTE — Progress Notes (Signed)
Diabetes Self-Management Education  Visit Type: First/Initial  Appt. Start Time: 1445 Appt. End Time: 1275  11/14/2021  Courtney Steele, identified by name and date of birth, is a 70 y.o. female with a diagnosis of Diabetes: Type 2.   ASSESSMENT Pt is a retired Marine scientist of 6 years, retired since 2017. States retirement is low stress.  Pt is taking Jardiance for their diabetes, no side effects. Pt reports checking their blood glucose fasting, and if they feel lightheaded or "goofy".  FBG this morning was 106. Pt reports history of hyperlipidemia and cardiac stent. Pt reports excessive fatigue due to fibromyalgia. Pt reports getting a tick bite in 2016 and contracted alpha-gal that made them highly allergic to mammal proteins (beef/pork/dairy). Pt can eat poultry and seafood. Pt is concerned with eating away from home. Pt typically eats 3 meals a day, will snack through the day. Pt likes to snack on chocolate, chips, or cookies, but tries not to keep them in the house. Pt reports drinking 50-60 oz. of water each day. Pt has been going to water aerobics for 90 minutes 3 times a week.  Height 5\' 3"  (1.6 m), weight 220 lb 12.8 oz (100.2 kg). Body mass index is 39.11 kg/m.   Diabetes Self-Management Education - 11/14/21 1502       Visit Information   Visit Type First/Initial      Initial Visit   Diabetes Type Type 2    Are you currently following a meal plan? No    Are you taking your medications as prescribed? Yes    Date Diagnosed 2010-2012      Health Coping   How would you rate your overall health? Fair      Psychosocial Assessment   Patient Belief/Attitude about Diabetes Motivated to manage diabetes    Self-care barriers None    Self-management support Doctor's office;Family    Other persons present Patient    Patient Concerns Nutrition/Meal planning;Healthy Lifestyle;Monitoring    Special Needs None    Preferred Learning Style No preference indicated    Learning  Readiness Contemplating    How often do you need to have someone help you when you read instructions, pamphlets, or other written materials from your doctor or pharmacy? 1 - Never    What is the last grade level you completed in school? 3 year diploma      Pre-Education Assessment   Patient understands the diabetes disease and treatment process. Needs Instruction    Patient understands incorporating nutritional management into lifestyle. Needs Instruction    Patient undertands incorporating physical activity into lifestyle. Needs Instruction    Patient understands using medications safely. Needs Instruction    Patient understands monitoring blood glucose, interpreting and using results Needs Instruction    Patient understands prevention, detection, and treatment of acute complications. Needs Instruction    Patient understands prevention, detection, and treatment of chronic complications. Needs Instruction    Patient understands how to develop strategies to address psychosocial issues. Needs Instruction    Patient understands how to develop strategies to promote health/change behavior. Needs Instruction      Complications   Last HgB A1C per patient/outside source 6.5 %   02/13/2021   How often do you check your blood sugar? 1-2 times/day    Fasting Blood glucose range (mg/dL) 70-129    Postprandial Blood glucose range (mg/dL) 130-179    Number of hypoglycemic episodes per month 0    Number of hyperglycemic episodes per week 0  Have you had a dilated eye exam in the past 12 months? Yes    Have you had a dental exam in the past 12 months? Yes    Are you checking your feet? Yes    How many days per week are you checking your feet? 3      Dietary Intake   Breakfast Steel cut oatmela, blueberries, mashed bananas, coffee, w oatmilk    Lunch Chicken bologna sandwich, vegan cheese, water    Dinner Pasta, tomato sauce, bell peppers    Beverage(s) coffee, water      Exercise   Exercise Type  ADL's;Moderate (swimming / aerobic walking)    How many days per week to you exercise? 3    How many minutes per day do you exercise? 90    Total minutes per week of exercise 270      Patient Education   Previous Diabetes Education No    Disease state  Explored patient's options for treatment of their diabetes    Nutrition management  Carbohydrate counting;Information on hints to eating out and maintain blood glucose control.;Role of diet in the treatment of diabetes and the relationship between the three main macronutrients and blood glucose level;Food label reading, portion sizes and measuring food.;Reviewed blood glucose goals for pre and post meals and how to evaluate the patients' food intake on their blood glucose level.    Physical activity and exercise  Role of exercise on diabetes management, blood pressure control and cardiac health.    Medications Reviewed patients medication for diabetes, action, purpose, timing of dose and side effects.    Monitoring Identified appropriate SMBG and/or A1C goals.;Purpose and frequency of SMBG.    Chronic complications Relationship between chronic complications and blood glucose control    Psychosocial adjustment Role of stress on diabetes      Individualized Goals (developed by patient)   Nutrition Follow meal plan discussed    Physical Activity Exercise 3-5 times per week    Medications take my medication as prescribed    Monitoring  test my blood glucose as discussed      Post-Education Assessment   Patient understands the diabetes disease and treatment process. Needs Review    Patient understands incorporating nutritional management into lifestyle. Needs Review    Patient undertands incorporating physical activity into lifestyle. Needs Review    Patient understands using medications safely. Needs Review    Patient understands monitoring blood glucose, interpreting and using results Needs Review    Patient understands prevention, detection,  and treatment of acute complications. Needs Review    Patient understands prevention, detection, and treatment of chronic complications. Needs Review    Patient understands how to develop strategies to address psychosocial issues. Needs Review    Patient understands how to develop strategies to promote health/change behavior. Needs Review      Outcomes   Expected Outcomes Demonstrated interest in learning. Expect positive outcomes    Future DMSE PRN    Program Status Other (comment)   Follow up PRN            Individualized Plan for Diabetes Self-Management Training:   Learning Objective:  Patient will have a greater understanding of diabetes self-management. Patient education plan is to attend individual and/or group sessions per assessed needs and concerns.   Plan:   Patient Instructions  ALL restaurants that have 20 or more locations have to post their nutrition information online. You can Google this information at any point!  Work towards eating  three meals a day, about 5-6 hours apart!  Have a balanced snack of 1 serving of carbs and 1 serving of protein if hungry.  Begin to recognize carbohydrates, protein, and non-starchy vegetables in your food choices!  Begin to build your meals using the proportions of the Balanced Plate. First, select your carb choice(s) for the meal. Next, select your source of protein to pair with your carb choice(s). Finally, complete the remaining half of your meal with a variety of non-starchy vegetables.  Check your blood sugar each morning before eating or drinking (fasting). Look for numbers between 70-100 mg/dL Check your blood sugar 2 hours after you begin eating a meal. Look for numbers under 180 mg/dL at all times.  Your goal A1c is below 6.5%    Expected Outcomes:  Demonstrated interest in learning. Expect positive outcomes  Education material provided: My Plate and Snack sheet  If problems or questions, patient to contact  team via:  Phone and Email  Future DSME appointment: PRN

## 2021-11-29 DIAGNOSIS — M25551 Pain in right hip: Secondary | ICD-10-CM | POA: Diagnosis not present

## 2021-12-06 ENCOUNTER — Other Ambulatory Visit: Payer: Self-pay

## 2021-12-06 MED ORDER — ATORVASTATIN CALCIUM 40 MG PO TABS: ORAL_TABLET | ORAL | 3 refills | Status: DC

## 2021-12-13 ENCOUNTER — Emergency Department (INDEPENDENT_AMBULATORY_CARE_PROVIDER_SITE_OTHER)
Admission: EM | Admit: 2021-12-13 | Discharge: 2021-12-13 | Disposition: A | Discharge status: Home / Self Care | Payer: Medicare Other | Source: Home / Self Care

## 2021-12-13 ENCOUNTER — Other Ambulatory Visit: Payer: Self-pay

## 2021-12-13 DIAGNOSIS — U071 COVID-19: Secondary | ICD-10-CM

## 2021-12-13 HISTORY — DX: COVID-19: U07.1

## 2021-12-13 MED ORDER — MOLNUPIRAVIR EUA 200MG CAPSULE: 4.0000 | ORAL_CAPSULE | Freq: Two times a day (BID) | ORAL | 0 refills | Status: AC

## 2021-12-13 NOTE — Discharge Instructions (Addendum)
Advised patient to self quarantine for the next 10 days or until 12/24/2021.  Advised patient may discontinue self quarantine if afebrile and asymptomatic after 5 days of symptoms.  Advised patient to take medication as directed with food to completion.  Encouraged patient increase daily water intake while taking this medication.

## 2021-12-13 NOTE — ED Triage Notes (Signed)
Pt states that she has facial pain, nasal congestion, and body aches. X1 day   Pt states that she was exposed to covid. Pt states that she had positive covid test 1/04.  Pt states that she is vaccinated for covid. Pt states that she has had flu vaccine.

## 2021-12-13 NOTE — ED Provider Notes (Signed)
[name]    CSN: [id number] Arrival date & time: 12/13/21  1036      History   Chief Complaint Chief Complaint  Patient presents with   Facial Pain    Facial pain, nasal congestion, bodyaches. X1 day    HPI LEANI MYRON is a 71 y.o. female.   HPI 71 year old female presents with COVID-19 this morning on home test.  Additionally, reports facial pain, nasal congestions, and body aches for 1 day.  PMH significant for CAD previous COVID, T2DM, HTN, and morbid obesity.  Past Medical History:  Diagnosis Date   Borderline diabetes    CAD (coronary artery disease)    a. 2012: DES to LAD    COVID    Degenerative joint disease    Diabetes mellitus    Fibromyalgia    Groin hematoma    Hematoma    Radial catheter site July 13, 2011   Hyperlipidemia    Hypertension    Morbid obesity ([place])    Osteoarthritis     Patient Active Problem List   Diagnosis Date Noted   Morbid obesity ([place])    Hypertension    Hyperlipidemia    Degenerative joint disease    CAD (coronary artery disease)    Borderline diabetes    Obesity (BMI 30-39.9) 11/14/2015   Pain in the chest    Unstable angina ([place]) 10/28/2015   Prediabetes 10/28/2015   Allergy with anaphylaxis due to food 08/08/2015   Osteoarthritis    Midsternal chest pain 02/23/2012   Dizziness 10/29/2011   CAD S/P LAD DES 2012    Essential hypertension    Dyslipidemia, goal LDL below 70    Fibromyalgia    Groin hematoma    Hematoma     Past Surgical History:  Procedure Laterality Date   ABDOMINAL HYSTERECTOMY     ANGIOPLASTY     BUNIONECTOMY     CARDIAC CATHETERIZATION     revealing patency of the LAD stent with mild compromise of  the diagonal and moderate mid LAD stenosis, but does not appear to  be high grade.    CARDIAC CATHETERIZATION N/A 10/31/2015   Procedure: Left Heart Cath and Coronary Angiography;  Surgeon: Peter M Martinique, MD;  Location: [facility];  Service: Cardiovascular;  Laterality:  N/A;   CARPAL TUNNEL RELEASE     CATARACT EXTRACTION     CHOLECYSTECTOMY     KNEE ARTHROSCOPY     TONSILLECTOMY      OB History   No obstetric history on file.      Home Medications    Prior to Admission medications   Medication Sig Start Date End Date Taking? Authorizing Provider  albuterol (PROVENTIL HFA;VENTOLIN HFA) 108 (90 BASE) MCG/ACT inhaler Inhale 1-2 puffs into the lungs every 6 (six) hours as needed for wheezing or shortness of breath. 05/10/15  Yes Debby Freiberg, MD  amLODipine (NORVASC) 2.5 MG tablet Take 1 tablet (2.5 mg total) by mouth daily. 01/13/21  Yes Martinique, Peter M, MD  aspirin EC 81 MG tablet Take 81 mg by mouth daily.   Yes [provider]  atorvastatin (LIPITOR) 40 MG tablet TAKE 1 TABLET(40 MG) BY MOUTH DAILY 12/06/21  Yes Martinique, Peter M, MD  DULoxetine (CYMBALTA) 60 MG capsule TK 1 C PO QD 08/18/18  Yes [provider]  empagliflozin (JARDIANCE) 10 MG TABS tablet Take by mouth daily.   Yes [provider]  EPINEPHrine 0.3 mg/0.3 mL IJ SOAJ injection Inject 0.3  mLs (0.3 mg total) into the muscle once. 05/10/15  Yes Debby Freiberg, MD  ergocalciferol (VITAMIN D2) 50000 UNITS capsule Take 50,000 Units by mouth once a week. Take on Saturday   Yes [provider]  icosapent Ethyl (VASCEPA) 1 g capsule Take 2 capsules twice a day 03/16/21  Yes Martinique, Peter M, MD  isosorbide mononitrate (IMDUR) 30 MG 24 hr tablet TAKE 1 TABLET(30 MG) BY MOUTH DAILY 07/07/21  Yes Martinique, Peter M, MD  metoprolol succinate (TOPROL-XL) 25 MG 24 hr tablet TAKE 1 TABLET BY MOUTH DAILY 04/10/21  Yes Martinique, Peter M, MD  molnupiravir EUA (LAGEVRIO) 200 mg CAPS capsule Take 4 capsules (800 mg total) by mouth 2 (two) times daily for 5 days. 12/13/21 12/18/21 Yes Eliezer Lofts, FNP  nitroGLYCERIN (NITROSTAT) 0.4 MG SL tablet Place 1 tablet (0.4 mg total) under the tongue every 5 (five) minutes as needed. For chest pain 12/22/20  Yes Martinique, Peter M, MD  pantoprazole  (PROTONIX) 40 MG tablet TAKE 1 TABLET(40 MG) BY MOUTH DAILY 10/16/21  Yes Martinique, Peter M, MD  pregabalin (LYRICA) 75 MG capsule TK 1 C PO BID 10/17/18  Yes [provider]  ranolazine (RANEXA) 1000 MG SR tablet TAKE 1 TABLET(1000 MG) BY MOUTH TWICE DAILY 02/27/21  Yes Martinique, Peter M, MD  traMADol (ULTRAM) 50 MG tablet Take 50 mg by mouth 3 (three) times daily as needed. For pain   Yes [provider]  diphenhydrAMINE (BENADRYL) 25 MG tablet Take 1 tablet (25 mg total) by mouth every 6 (six) hours for 5 days. 07/18/21 07/23/21  Fatima Blank, MD  famotidine (PEPCID) 20 MG tablet Take 1 tablet (20 mg total) by mouth daily for 5 days. 07/18/21 07/23/21  Fatima Blank, MD  meloxicam [facility]) 15 MG tablet  07/13/19   [provider]  ondansetron (ZOFRAN ODT) 4 MG disintegrating tablet Take 1 tablet (4 mg total) by mouth every 8 (eight) hours as needed for nausea or vomiting. 07/25/16   Horton, Barbette Hair, MD    Family History Family History  Problem Relation Age of Onset   Other Mother        died pulmonary issues   Coronary artery disease Father 20       died- CABG-HTN- AAA   Breast cancer Maternal Aunt        >50   Breast cancer Cousin 69    Social History Social History   Tobacco Use   Smoking status: Never   Smokeless tobacco: Never  Vaping Use   Vaping Use: Never used  Substance Use Topics   Alcohol use: Yes    Alcohol/week: 0.0 standard drinks    Comment: occ   Drug use: No     Allergies   Beef-derived products, Other, Benazepril, Erythromycin, Indomethacin, Lisinopril, Biaxin [clarithromycin], and Penicillins   Review of Systems Review of Systems  HENT:  Positive for congestion and sinus pain.   Musculoskeletal:  Positive for myalgias.  All other systems reviewed and are negative.   Physical Exam Triage Vital Signs ED Triage Vitals  Enc Vitals Group     BP 12/13/21 1109 101/62     Pulse Rate 12/13/21 1109 78     Resp 12/13/21  1109 18     Temp 12/13/21 1109 98.4 F (36.9 C)     Temp Source 12/13/21 1109 Oral     SpO2 12/13/21 1109 94 %     Weight 12/13/21 1107 220 lb (99.8 kg)  Height 12/13/21 1107 5\' 3"  (1.6 m)     Head Circumference --      Peak Flow --      Pain Score 12/13/21 1107 4     Pain Loc --      Pain Edu? --      Excl. in [place]? --    No data found.  Updated Vital Signs BP 101/62 (BP Location: Left Arm)    Pulse 78    Temp 98.4 F (36.9 C) (Oral)    Resp 18    Ht 5\' 3"  (1.6 m)    Wt 220 lb (99.8 kg)    SpO2 94%    BMI 38.97 kg/m       Physical Exam Vitals and nursing note reviewed.  Constitutional:      General: She is not in acute distress.    Appearance: She is obese. She is not ill-appearing.  HENT:     Right Ear: Tympanic membrane, ear canal and external ear normal.     Left Ear: Tympanic membrane, ear canal and external ear normal.     Mouth/Throat:     Mouth: Mucous membranes are moist.     Pharynx: Oropharynx is clear.  Eyes:     Extraocular Movements: Extraocular movements intact.     Conjunctiva/sclera: Conjunctivae normal.     Pupils: Pupils are equal, round, and reactive to light.  Cardiovascular:     Rate and Rhythm: Normal rate and regular rhythm.     Pulses: Normal pulses.     Heart sounds: Normal heart sounds.  Pulmonary:     Effort: Pulmonary effort is normal.     Breath sounds: Normal breath sounds.  Musculoskeletal:     Cervical back: Normal range of motion and neck supple.  Skin:    General: Skin is warm and dry.  Neurological:     General: No focal deficit present.     Mental Status: She is alert and oriented to person, place, and time.     UC Treatments / Results  Labs (all labs ordered are listed, but only abnormal results are displayed) Labs Reviewed - No data to display  EKG   Radiology No results found.  Procedures Procedures (including critical care time)  Medications Ordered in UC Medications - No data to display  Initial  Impression / Assessment and Plan / UC Course  I have reviewed the triage vital signs and the nursing notes.  Pertinent labs & imaging results that were available during my care of the patient were reviewed by me and considered in my medical decision making (see chart for details).     MDM: 1.  COVID-19-Rx'd Molnupiravir. Advised patient to self quarantine for the next 10 days or until 12/24/2021.  Advised patient may discontinue self quarantine if afebrile and asymptomatic after 5 days of symptoms.  Advised patient to take medication as directed with food to completion.  Encouraged patient increase daily water intake while taking this medication.  Patient discharged home, hemodynamically stable. Final Clinical Impressions(s) / UC Diagnoses   Final diagnoses:  [id number]     Discharge Instructions      Advised patient to self quarantine for the next 10 days or until 12/24/2021.  Advised patient may discontinue self quarantine if afebrile and asymptomatic after 5 days of symptoms.  Advised patient to take medication as directed with food to completion.  Encouraged patient increase daily water intake while taking this medication.     ED Prescriptions  Medication Sig Dispense Auth. Provider   molnupiravir EUA (LAGEVRIO) 200 mg CAPS capsule Take 4 capsules (800 mg total) by mouth 2 (two) times daily for 5 days. 40 capsule Eliezer Lofts, FNP      PDMP not reviewed this encounter.   Eliezer Lofts, [place] 12/13/21 1227

## 2021-12-19 NOTE — Progress Notes (Signed)
Cardiology Office Note:    Date:  12/21/2021   ID:  Courtney Steele, DOB 22-Aug-1951, MRN [id number]  PCP:  Kelton Pillar, MD [place] Cardiologist: Peter Martinique, MD   Reason for visit: 1 year  History of Present Illness:    Courtney Steele is a 71 y.o. female with a hx of CAD status post DES to LAD in 2012, left heart cath in 2016 showed patent LAD stent without significant residual disease.  Patient had occasional chest pain twinges.  She was treated with Ranexa and Imdur.  She last saw Dr. Martinique in January 2022.  He ordered a Lexiscan given her chest pain.  Lexiscan was negative for ischemia.  Today, she states she is doing well.  She states what chest discomfort she was having before she thinks was more related to indigestion.  She wanted to get checked out though because she says that her symptoms prior to her PCI were " my chest did not just feel right" more than classic chest heaviness.  She continues with water aerobics for an hour and a half 3 times a week.  This is helped her endurance and her fibromyalgia.  She has previously seen a nutritionist and has made adjustments into her diet to decrease her triglycerides.  She chooses more whole grains.  She denies shortness of breath, PND, orthopnea.  She has occasional lightheadedness she relates to her fibromyalgia.  She denies syncope.  She occasionally has dependent lower extremity edema.  She knows she should wear her compression socks more often.  She has rare palpitations.  She is due for blood work with her GP soon.     Past Medical History:  Diagnosis Date   Borderline diabetes    CAD (coronary artery disease)    a. 2012: DES to LAD    COVID    Degenerative joint disease    Diabetes mellitus    Fibromyalgia    Groin hematoma    Hematoma    Radial catheter site July 13, 2011   Hyperlipidemia    Hypertension    Morbid obesity ([place])    Osteoarthritis     Past Surgical History:  Procedure Laterality  Date   ABDOMINAL HYSTERECTOMY     ANGIOPLASTY     BUNIONECTOMY     CARDIAC CATHETERIZATION     revealing patency of the LAD stent with mild compromise of  the diagonal and moderate mid LAD stenosis, but does not appear to  be high grade.    CARDIAC CATHETERIZATION N/A 10/31/2015   Procedure: Left Heart Cath and Coronary Angiography;  Surgeon: Peter M Martinique, MD;  Location: [facility];  Service: Cardiovascular;  Laterality: N/A;   CARPAL TUNNEL RELEASE     CATARACT EXTRACTION     CHOLECYSTECTOMY     KNEE ARTHROSCOPY     TONSILLECTOMY      Current Medications: Current Meds  Medication Sig   albuterol (PROVENTIL HFA;VENTOLIN HFA) 108 (90 BASE) MCG/ACT inhaler Inhale 1-2 puffs into the lungs every 6 (six) hours as needed for wheezing or shortness of breath.   amLODipine (NORVASC) 2.5 MG tablet Take 1 tablet (2.5 mg total) by mouth daily.   aspirin EC 81 MG tablet Take 81 mg by mouth daily.   atorvastatin (LIPITOR) 40 MG tablet TAKE 1 TABLET(40 MG) BY MOUTH DAILY   calcium carbonate 100 mg/ml SUSP Take by mouth.   cholecalciferol (VITAMIN D3) 25 MCG (1000 UNIT) tablet 1 tablet   DULoxetine (CYMBALTA)  60 MG capsule TK 1 C PO QD   empagliflozin (JARDIANCE) 10 MG TABS tablet Take by mouth daily.   EPINEPHrine 0.3 mg/0.3 mL IJ SOAJ injection Inject 0.3 mLs (0.3 mg total) into the muscle once.   ergocalciferol (VITAMIN D2) 50000 UNITS capsule Take 50,000 Units by mouth once a week. Take on Saturday   icosapent Ethyl (VASCEPA) 1 g capsule Take 2 capsules twice a day   isosorbide mononitrate (IMDUR) 30 MG 24 hr tablet TAKE 1 TABLET(30 MG) BY MOUTH DAILY   metoprolol succinate (TOPROL-XL) 25 MG 24 hr tablet TAKE 1 TABLET BY MOUTH DAILY   nitroGLYCERIN (NITROSTAT) 0.4 MG SL tablet Place 1 tablet (0.4 mg total) under the tongue every 5 (five) minutes as needed. For chest pain   pantoprazole (PROTONIX) 40 MG tablet TAKE 1 TABLET(40 MG) BY MOUTH DAILY   pregabalin (LYRICA) 75 MG capsule TK 1  C PO BID   ranolazine (RANEXA) 1000 MG SR tablet TAKE 1 TABLET(1000 MG) BY MOUTH TWICE DAILY   traMADol (ULTRAM) 50 MG tablet Take 50 mg by mouth 3 (three) times daily as needed. For pain     Allergies:   Beef-derived products, Other, Benazepril, Erythromycin, Indomethacin, Lisinopril, Biaxin [clarithromycin], Clarithromycin, and Penicillins   Social History   Socioeconomic History   Marital status: Married    Spouse name: Not on file   Number of children: 2   Years of education: Not on file   Highest education level: Not on file  Occupational History   Occupation: URGENT CARE    Employer: [employer]  Tobacco Use   Smoking status: Never   Smokeless tobacco: Never  Vaping Use   Vaping Use: Never used  Substance and Sexual Activity   Alcohol use: Yes    Alcohol/week: 0.0 standard drinks    Comment: occ   Drug use: No   Sexual activity: Not on file  Other Topics Concern   Not on file  Social History Narrative   Not on file   Social Determinants of Health   Financial Resource Strain: Not on file  Food Insecurity: Not on file  Transportation Needs: Not on file  Physical Activity: Not on file  Stress: Not on file  Social Connections: Not on file     Family History: The patient's family history includes Breast cancer in her maternal aunt; Breast cancer (age of onset: 41) in her cousin; Coronary artery disease (age of onset: 65) in her father; Other in her mother.  ROS:   Please see the history of present illness.     EKGs/Labs/Other Studies Reviewed:    EKG:  The ekg ordered today demonstrates Normal sinus rhythm, right superior axis deviation, incomplete right bundle branch block, right ventricular hypertrophy.  Heart rate 80, PR interval 168 ms, QRS duration 94 ms.  Recent Labs: 07/18/2021: BUN 25; Creatinine, Ser 1.08; Hemoglobin 13.6; Platelets 184; Potassium 3.8; Sodium 142   Recent Lipid Panel Lab Results  Component Value Date/Time   CHOL 152 06/19/2021 09:17  AM   TRIG 206 (H) 06/19/2021 09:17 AM   HDL 42 06/19/2021 09:17 AM   LDLCALC 76 06/19/2021 09:17 AM    Physical Exam:    VS:  BP 122/86    Pulse 80    Ht 5\' 3"  (1.6 m)    Wt 221 lb 3.2 oz (100.3 kg)    SpO2 96%    BMI 39.18 kg/m    No data found.  Wt Readings from Last 3 Encounters:  12/21/21 221 lb 3.2 oz (100.3 kg)  12/13/21 220 lb (99.8 kg)  11/14/21 220 lb 12.8 oz (100.2 kg)     GEN:  Well nourished, well developed in no acute distress HEENT: Normal NECK: No JVD; No carotid bruits CARDIAC: RRR, no murmurs, rubs, gallops RESPIRATORY:  Clear to auscultation without rales, wheezing or rhonchi  ABDOMEN: Soft, non-tender, non-distended MUSCULOSKELETAL: trace-1+ bilateral LE edema; No deformity  SKIN: Warm and dry NEUROLOGIC:  Alert and oriented PSYCHIATRIC:  Normal affect     ASSESSMENT AND PLAN   CAD, stable -s/p DES to LAD 2012 -Continue aspirin and Lipitor.  -Continue antianginals -Toprol, amlodipine, Imdur and Ranexa. -Continue healthy eating and regular exercise.  Hypertension, well controlled -Continue current medications. -Goal BP is <130/80.  Recommend DASH diet (high in vegetables, fruits, low-fat dairy products, whole grains, poultry, fish, and nuts and low in sweets, sugar-sweetened beverages, and red meats), salt restriction and increase physical activity.  Hyperlipidemia with goal LDL less than 70. -LDL 76 in July 2022.  Continue Lipitor. -Attached patient information for low triglyceride diet. -Discussed cholesterol lowering diets - Mediterranean diet, DASH diet, vegetarian diet, low-carbohydrate diet and avoidance of trans fats.  Discussed healthier choice substitutes.  Nuts, high-fiber foods, and fiber supplements may also improve lipids.    Obesity -Patient has lost 4 pounds from last year.  Continue healthy eating. -Recommend moderate intensity activity for 150 minutes/week and the DASH diet.  Disposition - Follow-up in 1 year with Dr. Martinique.         Medication Adjustments/Labs and Tests Ordered: Current medicines are reviewed at length with the patient today.  Concerns regarding medicines are outlined above.  No orders of the defined types were placed in this encounter.  No orders of the defined types were placed in this encounter.   There are no Patient Instructions on file for this visit.   Signed, Warren Lacy, PA-C  12/21/2021 11:46 AM    [place]

## 2021-12-21 ENCOUNTER — Encounter: Payer: Self-pay | Admitting: Physician Assistant

## 2021-12-21 ENCOUNTER — Other Ambulatory Visit: Payer: Self-pay

## 2021-12-21 ENCOUNTER — Ambulatory Visit (INDEPENDENT_AMBULATORY_CARE_PROVIDER_SITE_OTHER): Payer: Medicare Other | Admitting: Physician Assistant

## 2021-12-21 VITALS — BP 122/86 | HR 80 | Ht 63.0 in | Wt 221.2 lb

## 2021-12-21 DIAGNOSIS — E785 Hyperlipidemia, unspecified: Secondary | ICD-10-CM

## 2021-12-21 DIAGNOSIS — I10 Essential (primary) hypertension: Secondary | ICD-10-CM

## 2021-12-21 DIAGNOSIS — I25119 Atherosclerotic heart disease of native coronary artery with unspecified angina pectoris: Secondary | ICD-10-CM

## 2021-12-21 NOTE — Patient Instructions (Signed)
Medication Instructions:  No Changes  *If you need a refill on your cardiac medications before your next appointment, please call your pharmacy*   Lab Work: No Labs If you have labs (blood work) drawn today and your tests are completely normal, you will receive your results only by: [place] (if you have MyChart) OR A paper copy in the mail If you have any lab test that is abnormal or we need to change your treatment, we will call you to review the results.   Testing/Procedures: No Testing    Follow-Up: At [facility], you and your health needs are our priority.  As part of our continuing mission to provide you with exceptional heart care, we have created designated Provider Care Teams.  These Care Teams include your primary Cardiologist (physician) and Advanced Practice Providers (APPs -  Physician Assistants and Nurse Practitioners) who all work together to provide you with the care you need, when you need it.  We recommend signing up for the patient portal called "MyChart".  Sign up information is provided on this After Visit Summary.  MyChart is used to connect with patients for Virtual Visits (Telemedicine).  Patients are able to view lab/test results, encounter notes, upcoming appointments, etc.  Non-urgent messages can be sent to your provider as well.   To learn more about what you can do with MyChart, go to [email or web address].    Your next appointment:   1 year(s)  The format for your next appointment:   In Person  Provider:   Peter Martinique, MD

## 2021-12-27 DIAGNOSIS — M25512 Pain in left shoulder: Secondary | ICD-10-CM | POA: Diagnosis not present

## 2022-01-03 ENCOUNTER — Other Ambulatory Visit: Payer: Self-pay | Admitting: Cardiology

## 2022-01-03 DIAGNOSIS — I10 Essential (primary) hypertension: Secondary | ICD-10-CM

## 2022-01-05 DIAGNOSIS — I119 Hypertensive heart disease without heart failure: Secondary | ICD-10-CM | POA: Diagnosis not present

## 2022-01-05 DIAGNOSIS — E1169 Type 2 diabetes mellitus with other specified complication: Secondary | ICD-10-CM | POA: Diagnosis not present

## 2022-01-05 DIAGNOSIS — Z7984 Long term (current) use of oral hypoglycemic drugs: Secondary | ICD-10-CM | POA: Diagnosis not present

## 2022-01-05 DIAGNOSIS — T781XXA Other adverse food reactions, not elsewhere classified, initial encounter: Secondary | ICD-10-CM | POA: Diagnosis not present

## 2022-01-05 DIAGNOSIS — M7552 Bursitis of left shoulder: Secondary | ICD-10-CM | POA: Diagnosis not present

## 2022-02-22 ENCOUNTER — Other Ambulatory Visit: Payer: Self-pay | Admitting: Cardiology

## 2022-02-23 ENCOUNTER — Other Ambulatory Visit: Payer: Self-pay | Admitting: Cardiology

## 2022-03-07 DIAGNOSIS — M797 Fibromyalgia: Secondary | ICD-10-CM | POA: Diagnosis not present

## 2022-03-07 DIAGNOSIS — Z0000 Encounter for general adult medical examination without abnormal findings: Secondary | ICD-10-CM | POA: Diagnosis not present

## 2022-03-07 DIAGNOSIS — E669 Obesity, unspecified: Secondary | ICD-10-CM | POA: Diagnosis not present

## 2022-03-07 DIAGNOSIS — K5730 Diverticulosis of large intestine without perforation or abscess without bleeding: Secondary | ICD-10-CM | POA: Diagnosis not present

## 2022-03-07 DIAGNOSIS — K219 Gastro-esophageal reflux disease without esophagitis: Secondary | ICD-10-CM | POA: Diagnosis not present

## 2022-03-07 DIAGNOSIS — Z66 Do not resuscitate: Secondary | ICD-10-CM | POA: Diagnosis not present

## 2022-03-07 DIAGNOSIS — I2510 Atherosclerotic heart disease of native coronary artery without angina pectoris: Secondary | ICD-10-CM | POA: Diagnosis not present

## 2022-03-07 DIAGNOSIS — I700 Atherosclerosis of aorta: Secondary | ICD-10-CM | POA: Diagnosis not present

## 2022-03-07 DIAGNOSIS — E1169 Type 2 diabetes mellitus with other specified complication: Secondary | ICD-10-CM | POA: Diagnosis not present

## 2022-03-07 DIAGNOSIS — F322 Major depressive disorder, single episode, severe without psychotic features: Secondary | ICD-10-CM | POA: Diagnosis not present

## 2022-03-07 DIAGNOSIS — I119 Hypertensive heart disease without heart failure: Secondary | ICD-10-CM | POA: Diagnosis not present

## 2022-03-07 DIAGNOSIS — E7800 Pure hypercholesterolemia, unspecified: Secondary | ICD-10-CM | POA: Diagnosis not present

## 2022-03-08 DIAGNOSIS — H2513 Age-related nuclear cataract, bilateral: Secondary | ICD-10-CM | POA: Diagnosis not present

## 2022-03-09 ENCOUNTER — Other Ambulatory Visit: Payer: Self-pay | Admitting: Internal Medicine

## 2022-03-09 DIAGNOSIS — Z13820 Encounter for screening for osteoporosis: Secondary | ICD-10-CM

## 2022-03-20 DIAGNOSIS — Z6838 Body mass index (BMI) 38.0-38.9, adult: Secondary | ICD-10-CM | POA: Diagnosis not present

## 2022-03-20 DIAGNOSIS — E669 Obesity, unspecified: Secondary | ICD-10-CM | POA: Diagnosis not present

## 2022-03-20 DIAGNOSIS — M1991 Primary osteoarthritis, unspecified site: Secondary | ICD-10-CM | POA: Diagnosis not present

## 2022-03-20 DIAGNOSIS — M797 Fibromyalgia: Secondary | ICD-10-CM | POA: Diagnosis not present

## 2022-03-20 DIAGNOSIS — Z79899 Other long term (current) drug therapy: Secondary | ICD-10-CM | POA: Diagnosis not present

## 2022-03-21 DIAGNOSIS — M25551 Pain in right hip: Secondary | ICD-10-CM | POA: Diagnosis not present

## 2022-03-21 DIAGNOSIS — M25561 Pain in right knee: Secondary | ICD-10-CM | POA: Diagnosis not present

## 2022-03-26 ENCOUNTER — Other Ambulatory Visit: Payer: Self-pay | Admitting: Internal Medicine

## 2022-03-26 DIAGNOSIS — Z1231 Encounter for screening mammogram for malignant neoplasm of breast: Secondary | ICD-10-CM

## 2022-03-28 DIAGNOSIS — M25561 Pain in right knee: Secondary | ICD-10-CM | POA: Insufficient documentation

## 2022-04-03 ENCOUNTER — Other Ambulatory Visit: Payer: Self-pay | Admitting: Cardiology

## 2022-04-03 DIAGNOSIS — I10 Essential (primary) hypertension: Secondary | ICD-10-CM

## 2022-04-05 ENCOUNTER — Other Ambulatory Visit: Payer: Self-pay | Admitting: Cardiology

## 2022-05-08 ENCOUNTER — Ambulatory Visit
Admission: RE | Admit: 2022-05-08 | Discharge: 2022-05-08 | Disposition: A | Discharge status: Home / Self Care | Payer: Medicare Other | Source: Ambulatory Visit | Attending: Internal Medicine | Admitting: Internal Medicine

## 2022-05-08 DIAGNOSIS — Z1231 Encounter for screening mammogram for malignant neoplasm of breast: Secondary | ICD-10-CM

## 2022-07-02 ENCOUNTER — Other Ambulatory Visit: Payer: Self-pay

## 2022-07-02 MED ORDER — ISOSORBIDE MONONITRATE ER 30 MG PO TB24: ORAL_TABLET | ORAL | 3 refills | Status: DC

## 2022-07-10 DIAGNOSIS — Z79891 Long term (current) use of opiate analgesic: Secondary | ICD-10-CM | POA: Diagnosis not present

## 2022-07-10 DIAGNOSIS — M47816 Spondylosis without myelopathy or radiculopathy, lumbar region: Secondary | ICD-10-CM | POA: Diagnosis not present

## 2022-07-10 DIAGNOSIS — M47812 Spondylosis without myelopathy or radiculopathy, cervical region: Secondary | ICD-10-CM | POA: Diagnosis not present

## 2022-07-10 DIAGNOSIS — G894 Chronic pain syndrome: Secondary | ICD-10-CM | POA: Diagnosis not present

## 2022-07-10 DIAGNOSIS — E1142 Type 2 diabetes mellitus with diabetic polyneuropathy: Secondary | ICD-10-CM | POA: Diagnosis not present

## 2022-07-31 DIAGNOSIS — M25561 Pain in right knee: Secondary | ICD-10-CM | POA: Diagnosis not present

## 2022-07-31 DIAGNOSIS — M179 Osteoarthritis of knee, unspecified: Secondary | ICD-10-CM | POA: Diagnosis not present

## 2022-08-06 ENCOUNTER — Ambulatory Visit
Admission: EM | Admit: 2022-08-06 | Discharge: 2022-08-06 | Disposition: A | Discharge status: Home / Self Care | Payer: Medicare Other | Attending: Urgent Care | Admitting: Urgent Care

## 2022-08-06 DIAGNOSIS — M79644 Pain in right finger(s): Secondary | ICD-10-CM | POA: Diagnosis not present

## 2022-08-06 DIAGNOSIS — L03011 Cellulitis of right finger: Secondary | ICD-10-CM | POA: Diagnosis not present

## 2022-08-06 DIAGNOSIS — T63461A Toxic effect of venom of wasps, accidental (unintentional), initial encounter: Secondary | ICD-10-CM | POA: Diagnosis not present

## 2022-08-06 MED ORDER — TRIAMCINOLONE ACETONIDE 0.1 % EX CREA: 1.0000 | TOPICAL_CREAM | Freq: Two times a day (BID) | CUTANEOUS | 0 refills | Status: DC

## 2022-08-06 MED ORDER — DOXYCYCLINE HYCLATE 100 MG PO CAPS: 100.0000 mg | ORAL_CAPSULE | Freq: Two times a day (BID) | ORAL | 0 refills | Status: DC

## 2022-08-06 NOTE — ED Triage Notes (Signed)
Pt. Stuck her hand into a wasp nest and injured her right thumb.

## 2022-08-06 NOTE — ED Provider Notes (Signed)
[place]   MRN: [id number] DOB: 03/08/1951  Subjective:   Courtney Steele is a 71 y.o. female presenting for 2 day history of acute onset persistent and worsening right thumb pain, swelling, itching and redness.  Symptoms started after she accidentally stuck her hand into a wasp nest.  She was stung multiple times but just on the phone.  She has been using over-the-counter steroid cream with minimal relief.  No fever, drainage of pus or bleeding.  No current facility-administered medications for this encounter.  Current Outpatient Medications:    albuterol (PROVENTIL HFA;VENTOLIN HFA) 108 (90 BASE) MCG/ACT inhaler, Inhale 1-2 puffs into the lungs every 6 (six) hours as needed for wheezing or shortness of breath., Disp: 1 Inhaler, Rfl: 0   amLODipine (NORVASC) 2.5 MG tablet, TAKE 1 TABLET(2.5 MG) BY MOUTH DAILY, Disp: 90 tablet, Rfl: 3   aspirin EC 81 MG tablet, Take 81 mg by mouth daily., Disp: , Rfl:    atorvastatin (LIPITOR) 40 MG tablet, TAKE 1 TABLET(40 MG) BY MOUTH DAILY, Disp: 90 tablet, Rfl: 3   calcium carbonate 100 mg/ml SUSP, Take by mouth., Disp: , Rfl:    cholecalciferol (VITAMIN D3) 25 MCG (1000 UNIT) tablet, 1 tablet, Disp: , Rfl:    diphenhydrAMINE (BENADRYL) 25 MG tablet, Take 1 tablet (25 mg total) by mouth every 6 (six) hours for 5 days., Disp: 20 tablet, Rfl: 0   DULoxetine (CYMBALTA) 60 MG capsule, TK 1 C PO QD, Disp: , Rfl: 1   empagliflozin (JARDIANCE) 10 MG TABS tablet, Take by mouth daily., Disp: , Rfl:    EPINEPHrine 0.3 mg/0.3 mL IJ SOAJ injection, Inject 0.3 mLs (0.3 mg total) into the muscle once., Disp: 1 Device, Rfl: 0   ergocalciferol (VITAMIN D2) 50000 UNITS capsule, Take 50,000 Units by mouth once a week. Take on Saturday, Disp: , Rfl:    famotidine (PEPCID) 20 MG tablet, Take 1 tablet (20 mg total) by mouth daily for 5 days., Disp: 5 tablet, Rfl: 0   icosapent Ethyl (VASCEPA) 1 g capsule, TAKE 2 CAPSULES BY MOUTH TWICE DAILY, Disp:  360 capsule, Rfl: 3   isosorbide mononitrate (IMDUR) 30 MG 24 hr tablet, TAKE 1 TABLET(30 MG) BY MOUTH DAILY, Disp: 90 tablet, Rfl: 3   metoprolol succinate (TOPROL-XL) 25 MG 24 hr tablet, TAKE 1 TABLET BY MOUTH DAILY, Disp: 90 tablet, Rfl: 3   nitroGLYCERIN (NITROSTAT) 0.4 MG SL tablet, Place 1 tablet (0.4 mg total) under the tongue every 5 (five) minutes as needed. For chest pain, Disp: 25 tablet, Rfl: 3   pantoprazole (PROTONIX) 40 MG tablet, TAKE 1 TABLET(40 MG) BY MOUTH DAILY, Disp: 90 tablet, Rfl: 3   pregabalin (LYRICA) 75 MG capsule, TK 1 C PO BID, Disp: , Rfl: 4   ranolazine (RANEXA) 1000 MG SR tablet, TAKE 1 TABLET(1000 MG) BY MOUTH TWICE DAILY, Disp: 180 tablet, Rfl: 3   traMADol (ULTRAM) 50 MG tablet, Take 50 mg by mouth 3 (three) times daily as needed. For pain, Disp: , Rfl:    Allergies  Allergen Reactions   Beef-Derived Products Anaphylaxis   Other Anaphylaxis    No mammal protein substances   Benazepril Cough   Erythromycin Diarrhea   Indomethacin Diarrhea   Lisinopril     cough   Biaxin [Clarithromycin] Rash   Clarithromycin Rash    Other reaction(s): rash   Penicillins Rash    Past Medical History:  Diagnosis Date   Borderline diabetes    CAD (  coronary artery disease)    a. 2012: DES to LAD    COVID    Degenerative joint disease    Diabetes mellitus    Fibromyalgia    Groin hematoma    Hematoma    Radial catheter site July 13, 2011   Hyperlipidemia    Hypertension    Morbid obesity ([place])    Osteoarthritis      Past Surgical History:  Procedure Laterality Date   ABDOMINAL HYSTERECTOMY     ANGIOPLASTY     BUNIONECTOMY     CARDIAC CATHETERIZATION     revealing patency of the LAD stent with mild compromise of  the diagonal and moderate mid LAD stenosis, but does not appear to  be high grade.    CARDIAC CATHETERIZATION N/A 10/31/2015   Procedure: Left Heart Cath and Coronary Angiography;  Surgeon: Peter M Martinique, MD;  Location: [facility];   Service: Cardiovascular;  Laterality: N/A;   CARPAL TUNNEL RELEASE     CATARACT EXTRACTION     CHOLECYSTECTOMY     KNEE ARTHROSCOPY     TONSILLECTOMY      Family History  Problem Relation Age of Onset   Other Mother        died pulmonary issues   Coronary artery disease Father 24       died- CABG-HTN- AAA   Breast cancer Maternal Aunt        >50   Breast cancer Cousin 56    Social History   Tobacco Use   Smoking status: Never   Smokeless tobacco: Never  Vaping Use   Vaping Use: Never used  Substance Use Topics   Alcohol use: Yes    Alcohol/week: 0.0 standard drinks of alcohol    Comment: occ   Drug use: No    ROS   Objective:   Vitals: BP 117/76 (BP Location: Right Arm)   Pulse 78   Temp 97.9 F (36.6 C) (Oral)   Resp 16   SpO2 93%   Physical Exam Constitutional:      General: She is not in acute distress.    Appearance: Normal appearance. She is well-developed. She is not ill-appearing, toxic-appearing or diaphoretic.  HENT:     Head: Normocephalic and atraumatic.     Nose: Nose normal.     Mouth/Throat:     Mouth: Mucous membranes are moist.  Eyes:     General: No scleral icterus.       Right eye: No discharge.        Left eye: No discharge.     Extraocular Movements: Extraocular movements intact.  Cardiovascular:     Rate and Rhythm: Normal rate.  Pulmonary:     Effort: Pulmonary effort is normal.  Musculoskeletal:       Arms:  Skin:    General: Skin is warm and dry.  Neurological:     General: No focal deficit present.     Mental Status: She is alert and oriented to person, place, and time.  Psychiatric:        Mood and Affect: Mood normal.        Behavior: Behavior normal.     Assessment and Plan :   PDMP not reviewed this encounter.  1. Cellulitis of right thumb   2. Pain of right thumb   3. Wasp sting, accidental or unintentional, initial encounter    Will cover for cellulitis of her right thumb with doxycycline.   Recommended topical steroid for the  localized skin reaction from the wasp stings.  Patient requested oral steroids but I advised to avoid this for now. Counseled patient on potential for adverse effects with medications prescribed/recommended today, ER and return-to-clinic precautions discussed, patient verbalized understanding.    Jaynee Eagles, PA-C 08/06/22 1025

## 2022-08-07 DIAGNOSIS — G894 Chronic pain syndrome: Secondary | ICD-10-CM | POA: Diagnosis not present

## 2022-08-07 DIAGNOSIS — M25561 Pain in right knee: Secondary | ICD-10-CM | POA: Diagnosis not present

## 2022-08-07 DIAGNOSIS — E1142 Type 2 diabetes mellitus with diabetic polyneuropathy: Secondary | ICD-10-CM | POA: Diagnosis not present

## 2022-08-07 DIAGNOSIS — M47812 Spondylosis without myelopathy or radiculopathy, cervical region: Secondary | ICD-10-CM | POA: Diagnosis not present

## 2022-08-07 DIAGNOSIS — M47816 Spondylosis without myelopathy or radiculopathy, lumbar region: Secondary | ICD-10-CM | POA: Diagnosis not present

## 2022-08-09 DIAGNOSIS — M25561 Pain in right knee: Secondary | ICD-10-CM | POA: Diagnosis not present

## 2022-08-14 DIAGNOSIS — M25561 Pain in right knee: Secondary | ICD-10-CM | POA: Diagnosis not present

## 2022-08-17 DIAGNOSIS — M1711 Unilateral primary osteoarthritis, right knee: Secondary | ICD-10-CM | POA: Insufficient documentation

## 2022-08-20 DIAGNOSIS — M25561 Pain in right knee: Secondary | ICD-10-CM | POA: Diagnosis not present

## 2022-08-21 DIAGNOSIS — M1711 Unilateral primary osteoarthritis, right knee: Secondary | ICD-10-CM | POA: Diagnosis not present

## 2022-08-22 ENCOUNTER — Telehealth: Payer: Self-pay | Admitting: *Deleted

## 2022-08-22 NOTE — Telephone Encounter (Signed)
   Pre-operative Risk Assessment    Patient Name: Courtney Steele  DOB: [birth year] MRN: [id number]      Request for Surgical Clearance    Procedure:   RIGHT TOTAL KNEE ARTHROPLASTY  Date of Surgery:  Clearance TBD                                 Surgeon:  DR. Rod Can Surgeon's Group or Practice Name:  [facility] Phone number:  [phone] ATTN: [place] Fax number:  [phone]   Type of Clearance Requested:   - Medical ; ASA  A  Type of Anesthesia:  Spinal   Additional requests/questions:    Jiles Prows   08/22/2022, 3:52 PM

## 2022-08-23 DIAGNOSIS — M25561 Pain in right knee: Secondary | ICD-10-CM | POA: Diagnosis not present

## 2022-08-24 ENCOUNTER — Telehealth: Payer: Self-pay

## 2022-08-24 NOTE — Telephone Encounter (Signed)
Primary Cardiologist:Peter Martinique, MD   Preoperative team, please contact this patient and set up a phone call appointment for further preoperative risk assessment. Please obtain consent and complete medication review. Thank you for your help.   Prior stent in proximal LAD widely patent on cardiac catheterization 10/2015.  It is reasonable to hold aspirin for upcoming procedure pending no symptoms of ACS.   Emmaline Life, NP-C     08/24/2022, 11:38 AM [address] Office [phone] Fax [phone]

## 2022-08-24 NOTE — Telephone Encounter (Signed)
Patient is scheduled for a telehealth visit on 08/31/22 for clearance. Med rec and consent done.

## 2022-08-24 NOTE — Telephone Encounter (Signed)
  Patient Consent for Virtual Visit         Courtney Steele has provided verbal consent on 08/24/2022 for a virtual visit (video or telephone).   CONSENT FOR VIRTUAL VISIT FOR:  Courtney Steele  By participating in this virtual visit I agree to the following:  I hereby voluntarily request, consent and authorize [facility] and its employed or contracted physicians, physician assistants, nurse practitioners or other licensed health care professionals (the Practitioner), to provide me with telemedicine health care services (the "Services") as deemed necessary by the treating Practitioner. I acknowledge and consent to receive the Services by the Practitioner via telemedicine. I understand that the telemedicine visit will involve communicating with the Practitioner through live audiovisual communication technology and the disclosure of certain medical information by electronic transmission. I acknowledge that I have been given the opportunity to request an in-person assessment or other available alternative prior to the telemedicine visit and am voluntarily participating in the telemedicine visit.  I understand that I have the right to withhold or withdraw my consent to the use of telemedicine in the course of my care at any time, without affecting my right to future care or treatment, and that the Practitioner or I may terminate the telemedicine visit at any time. I understand that I have the right to inspect all information obtained and/or recorded in the course of the telemedicine visit and may receive copies of available information for a reasonable fee.  I understand that some of the potential risks of receiving the Services via telemedicine include:  Delay or interruption in medical evaluation due to technological equipment failure or disruption; Information transmitted may not be sufficient (e.g. poor resolution of images) to allow for appropriate medical decision making by the Practitioner;  and/or  In rare instances, security protocols could fail, causing a breach of personal health information.  Furthermore, I acknowledge that it is my responsibility to provide information about my medical history, conditions and care that is complete and accurate to the best of my ability. I acknowledge that Practitioner's advice, recommendations, and/or decision may be based on factors not within their control, such as incomplete or inaccurate data provided by me or distortions of diagnostic images or specimens that may result from electronic transmissions. I understand that the practice of medicine is not an exact science and that Practitioner makes no warranties or guarantees regarding treatment outcomes. I acknowledge that a copy of this consent can be made available to me via my patient portal ([facility]), or I can request a printed copy by calling the office of [facility].    I understand that my insurance will be billed for this visit.   I have read or had this consent read to me. I understand the contents of this consent, which adequately explains the benefits and risks of the Services being provided via telemedicine.  I have been provided ample opportunity to ask questions regarding this consent and the Services and have had my questions answered to my satisfaction. I give my informed consent for the services to be provided through the use of telemedicine in my medical care

## 2022-08-28 DIAGNOSIS — M25561 Pain in right knee: Secondary | ICD-10-CM | POA: Diagnosis not present

## 2022-08-30 DIAGNOSIS — M25561 Pain in right knee: Secondary | ICD-10-CM | POA: Diagnosis not present

## 2022-08-31 ENCOUNTER — Ambulatory Visit: Payer: Medicare Other | Attending: Cardiology | Admitting: Physician Assistant

## 2022-08-31 DIAGNOSIS — Z01810 Encounter for preprocedural cardiovascular examination: Secondary | ICD-10-CM | POA: Diagnosis not present

## 2022-08-31 NOTE — Progress Notes (Signed)
Virtual Visit via Telephone Note   Because of Courtney Steele co-morbid illnesses, she is at least at moderate risk for complications without adequate follow up.  This format is felt to be most appropriate for this patient at this time.  The patient did not have access to video technology/had technical difficulties with video requiring transitioning to audio format only (telephone).  All issues noted in this document were discussed and addressed.  No physical exam could be performed with this format.  Please refer to the patient's chart for her consent to telehealth for Courtney Steele.  Evaluation Performed:  Preoperative cardiovascular risk assessment _____________   Date:  08/31/2022   Patient ID:  Courtney Steele, DOB 1951/08/05, MRN [id number] Patient Location:  Home Provider location:   Office  Primary Care Provider:  Kelton Pillar, MD Primary Cardiologist:  Peter Martinique, MD  Chief Complaint / Patient Profile   71 y.o. y/o female with a h/o CAD status post DES to LAD in 2012, history of COVID, hypertension, hyperlipidemia who is pending right total knee arthroplasty and presents today for telephonic preoperative cardiovascular risk assessment.  Past Medical History    Past Medical History:  Diagnosis Date   Borderline diabetes    CAD (coronary artery disease)    a. 2012: DES to LAD    COVID    Degenerative joint disease    Diabetes mellitus    Fibromyalgia    Groin hematoma    Hematoma    Radial catheter site July 13, 2011   Hyperlipidemia    Hypertension    Morbid obesity ([place])    Osteoarthritis    Past Surgical History:  Procedure Laterality Date   ABDOMINAL HYSTERECTOMY     ANGIOPLASTY     BUNIONECTOMY     CARDIAC CATHETERIZATION     revealing patency of the LAD stent with mild compromise of  the diagonal and moderate mid LAD stenosis, but does not appear to  be high grade.    CARDIAC CATHETERIZATION N/A 10/31/2015   Procedure: Left Heart Cath and  Coronary Angiography;  Surgeon: Peter M Martinique, MD;  Location: [facility];  Service: Cardiovascular;  Laterality: N/A;   CARPAL TUNNEL RELEASE     CATARACT EXTRACTION     CHOLECYSTECTOMY     KNEE ARTHROSCOPY     TONSILLECTOMY      Allergies  Allergies  Allergen Reactions   Beef-Derived Products Anaphylaxis   Other Anaphylaxis    No mammal protein substances   Benazepril Cough   Erythromycin Diarrhea   Indomethacin Diarrhea   Lisinopril     cough   Biaxin [Clarithromycin] Rash   Clarithromycin Rash    Other reaction(s): rash   Penicillins Rash    History of Present Illness    Courtney Steele is a 71 y.o. female who presents via audio/video conferencing for a telehealth visit today.  Pt was last seen in cardiology clinic on 12/21/2021 by Caron Presume, PA.  At that time CLETA Steele was doing well.  The patient is now pending procedure as outlined above. Since her last visit, she has not had any chest pain or shortness of breath.  She also denies palpitations.  She has not needed her nitro.  In general she states she is doing well.  She does have some arthritis that limits her.  Her youngest son helps with some of the indoor and outdoor chores.  She goes to the [facility] [place] 3 times a week and  does water aerobics for about 1.5 hours each time.  She also tells me she has no trouble with walking or going up and down flight of stairs.  For this reason she is scored a 5.56 METS on the DASI.  This exceeds the minimum 4 METS requirement.  The patient may hold her aspirin x7 days prior to the procedure.  Please restart medically safe to do so.  Of note, the patient tells me that she was actually told to double up on her baby aspirin and take 1 in the morning and 1 in the evening prior to surgery.  I stated that this is common after surgery for DVT prophylaxis but she might check her preop paperwork because normally they would like you to hold the aspirin before surgery.   Home Medications     Prior to Admission medications   Medication Sig Start Date End Date Taking? Authorizing Provider  albuterol (PROVENTIL HFA;VENTOLIN HFA) 108 (90 BASE) MCG/ACT inhaler Inhale 1-2 puffs into the lungs every 6 (six) hours as needed for wheezing or shortness of breath. 05/10/15   Debby Freiberg, MD  amLODipine (NORVASC) 2.5 MG tablet TAKE 1 TABLET(2.5 MG) BY MOUTH DAILY 01/03/22   Martinique, Peter M, MD  aspirin EC 81 MG tablet Take 81 mg by mouth daily.    [provider]  atorvastatin (LIPITOR) 40 MG tablet TAKE 1 TABLET(40 MG) BY MOUTH DAILY 12/06/21   Martinique, Peter M, MD  calcium carbonate 100 mg/ml SUSP Take by mouth.    [provider]  cholecalciferol (VITAMIN D3) 25 MCG (1000 UNIT) tablet 1 tablet    [provider]  diphenhydrAMINE (BENADRYL) 25 MG tablet Take 1 tablet (25 mg total) by mouth every 6 (six) hours for 5 days. 07/18/21 07/23/21  Fatima Blank, MD  doxycycline (VIBRAMYCIN) 100 MG capsule Take 1 capsule (100 mg total) by mouth 2 (two) times daily. 08/06/22   Jaynee Eagles, PA-C  DULoxetine (CYMBALTA) 60 MG capsule TK 1 C PO QD 08/18/18   [provider]  empagliflozin (JARDIANCE) 10 MG TABS tablet Take by mouth daily.    [provider]  EPINEPHrine 0.3 mg/0.3 mL IJ SOAJ injection Inject 0.3 mLs (0.3 mg total) into the muscle once. 05/10/15   Debby Freiberg, MD  ergocalciferol (VITAMIN D2) 50000 UNITS capsule Take 50,000 Units by mouth once a week. Take on Saturday    [provider]  famotidine (PEPCID) 20 MG tablet Take 1 tablet (20 mg total) by mouth daily for 5 days. 07/18/21 07/23/21  Fatima Blank, MD  icosapent Ethyl (VASCEPA) 1 g capsule TAKE 2 CAPSULES BY MOUTH TWICE DAILY 02/23/22   Martinique, Peter M, MD  isosorbide mononitrate (IMDUR) 30 MG 24 hr tablet TAKE 1 TABLET(30 MG) BY MOUTH DAILY 07/02/22   Martinique, Peter M, MD  metoprolol succinate (TOPROL-XL) 25 MG 24 hr tablet TAKE 1 TABLET BY MOUTH DAILY 04/04/22   Martinique,  Peter M, MD  nitroGLYCERIN (NITROSTAT) 0.4 MG SL tablet Place 1 tablet (0.4 mg total) under the tongue every 5 (five) minutes as needed. For chest pain 12/22/20   Martinique, Peter M, MD  pantoprazole (PROTONIX) 40 MG tablet TAKE 1 TABLET(40 MG) BY MOUTH DAILY 04/06/22   Martinique, Peter M, MD  pregabalin (LYRICA) 75 MG capsule TK 1 C PO BID 10/17/18   [provider]  ranolazine (RANEXA) 1000 MG SR tablet TAKE 1 TABLET(1000 MG) BY MOUTH TWICE DAILY 02/22/22   Martinique, Peter M, MD  traMADol (  ULTRAM) 50 MG tablet Take 50 mg by mouth 3 (three) times daily as needed. For pain    [provider]  triamcinolone cream (KENALOG) 0.1 % Apply 1 Application topically 2 (two) times daily. 08/06/22   Jaynee Eagles, PA-C    Physical Exam    Vital Signs:  MAYANA IRIGOYEN does not have vital signs available for review today.  Given telephonic nature of communication, physical exam is limited. AAOx3. NAD. Normal affect.  Speech and respirations are unlabored.  Accessory Clinical Findings    None  Assessment & Plan    1.  Preoperative Cardiovascular Risk Assessment:  Ms. Rawson perioperative risk of a major cardiac event is 0.9% according to the Revised Cardiac Risk Index (RCRI).  Therefore, she is at low risk for perioperative complications.   Her functional capacity is good at 5.56 METs according to the [facility] Activity Status Index (DASI). Recommendations: According to ACC/AHA guidelines, no further cardiovascular testing needed.  The patient may proceed to surgery at acceptable risk.   Antiplatelet and/or Anticoagulation Recommendations: Aspirin can be held for 7 days prior to her surgery.  Please resume Aspirin post operatively when it is felt to be safe from a bleeding standpoint.     A copy of this note will be routed to requesting surgeon.  Time:   Today, I have spent 7 minutes with the patient with telehealth technology discussing medical history, symptoms, and management plan.     Elgie Collard, PA-C  08/31/2022, 2:25 PM

## 2022-09-03 ENCOUNTER — Ambulatory Visit: Payer: Self-pay | Admitting: Student

## 2022-09-03 DIAGNOSIS — E119 Type 2 diabetes mellitus without complications: Secondary | ICD-10-CM

## 2022-09-04 DIAGNOSIS — M25561 Pain in right knee: Secondary | ICD-10-CM | POA: Diagnosis not present

## 2022-09-07 DIAGNOSIS — M1711 Unilateral primary osteoarthritis, right knee: Secondary | ICD-10-CM | POA: Diagnosis not present

## 2022-09-07 DIAGNOSIS — Z23 Encounter for immunization: Secondary | ICD-10-CM | POA: Diagnosis not present

## 2022-09-07 DIAGNOSIS — M797 Fibromyalgia: Secondary | ICD-10-CM | POA: Diagnosis not present

## 2022-09-07 DIAGNOSIS — K219 Gastro-esophageal reflux disease without esophagitis: Secondary | ICD-10-CM | POA: Diagnosis not present

## 2022-09-07 DIAGNOSIS — E7800 Pure hypercholesterolemia, unspecified: Secondary | ICD-10-CM | POA: Diagnosis not present

## 2022-09-07 DIAGNOSIS — F322 Major depressive disorder, single episode, severe without psychotic features: Secondary | ICD-10-CM | POA: Diagnosis not present

## 2022-09-07 DIAGNOSIS — I119 Hypertensive heart disease without heart failure: Secondary | ICD-10-CM | POA: Diagnosis not present

## 2022-09-07 DIAGNOSIS — E1169 Type 2 diabetes mellitus with other specified complication: Secondary | ICD-10-CM | POA: Diagnosis not present

## 2022-09-13 ENCOUNTER — Ambulatory Visit
Admission: RE | Admit: 2022-09-13 | Discharge: 2022-09-13 | Disposition: A | Discharge status: Home / Self Care | Payer: Medicare Other | Source: Ambulatory Visit | Attending: Internal Medicine | Admitting: Internal Medicine

## 2022-09-13 DIAGNOSIS — Z780 Asymptomatic menopausal state: Secondary | ICD-10-CM | POA: Diagnosis not present

## 2022-09-13 DIAGNOSIS — Z13820 Encounter for screening for osteoporosis: Secondary | ICD-10-CM

## 2022-09-15 DIAGNOSIS — Z23 Encounter for immunization: Secondary | ICD-10-CM | POA: Diagnosis not present

## 2022-09-18 DIAGNOSIS — G894 Chronic pain syndrome: Secondary | ICD-10-CM | POA: Diagnosis not present

## 2022-09-18 DIAGNOSIS — M47812 Spondylosis without myelopathy or radiculopathy, cervical region: Secondary | ICD-10-CM | POA: Diagnosis not present

## 2022-09-18 DIAGNOSIS — E1142 Type 2 diabetes mellitus with diabetic polyneuropathy: Secondary | ICD-10-CM | POA: Diagnosis not present

## 2022-09-18 DIAGNOSIS — M47816 Spondylosis without myelopathy or radiculopathy, lumbar region: Secondary | ICD-10-CM | POA: Diagnosis not present

## 2022-09-21 ENCOUNTER — Emergency Department (HOSPITAL_BASED_OUTPATIENT_CLINIC_OR_DEPARTMENT_OTHER)
Admission: EM | Admit: 2022-09-21 | Discharge: 2022-09-22 | Disposition: A | Discharge status: Home / Self Care | Payer: Medicare Other | Attending: Emergency Medicine | Admitting: Emergency Medicine

## 2022-09-21 ENCOUNTER — Encounter (HOSPITAL_BASED_OUTPATIENT_CLINIC_OR_DEPARTMENT_OTHER): Payer: Self-pay | Admitting: Emergency Medicine

## 2022-09-21 DIAGNOSIS — Z7982 Long term (current) use of aspirin: Secondary | ICD-10-CM | POA: Insufficient documentation

## 2022-09-21 DIAGNOSIS — R062 Wheezing: Secondary | ICD-10-CM | POA: Diagnosis not present

## 2022-09-21 DIAGNOSIS — R0602 Shortness of breath: Secondary | ICD-10-CM | POA: Insufficient documentation

## 2022-09-21 DIAGNOSIS — Z8616 Personal history of COVID-19: Secondary | ICD-10-CM | POA: Diagnosis not present

## 2022-09-21 DIAGNOSIS — Z79899 Other long term (current) drug therapy: Secondary | ICD-10-CM | POA: Diagnosis not present

## 2022-09-21 DIAGNOSIS — I2510 Atherosclerotic heart disease of native coronary artery without angina pectoris: Secondary | ICD-10-CM | POA: Insufficient documentation

## 2022-09-21 DIAGNOSIS — T7840XA Allergy, unspecified, initial encounter: Secondary | ICD-10-CM | POA: Diagnosis not present

## 2022-09-21 DIAGNOSIS — I10 Essential (primary) hypertension: Secondary | ICD-10-CM | POA: Diagnosis not present

## 2022-09-21 DIAGNOSIS — R000 Tachycardia, unspecified: Secondary | ICD-10-CM | POA: Diagnosis not present

## 2022-09-21 MED ORDER — HYDROXYZINE HCL 25 MG PO TABS
25.0000 mg | ORAL_TABLET | Freq: Once | ORAL | Status: AC
  Administered 2022-09-21: 25 mg via ORAL
  Filled 2022-09-21: qty 1

## 2022-09-21 MED ORDER — FAMOTIDINE IN NACL 20-0.9 MG/50ML-% IV SOLN
20.0000 mg | Freq: Once | INTRAVENOUS | Status: AC
  Administered 2022-09-21: 20 mg via INTRAVENOUS
  Filled 2022-09-21: qty 50

## 2022-09-21 MED ORDER — SODIUM CHLORIDE 0.9 % IV BOLUS
500.0000 mL | Freq: Once | INTRAVENOUS | Status: AC
  Administered 2022-09-21: 500 mL via INTRAVENOUS

## 2022-09-21 MED ORDER — DIPHENHYDRAMINE HCL 50 MG/ML IJ SOLN
25.0000 mg | Freq: Once | INTRAMUSCULAR | Status: AC
  Administered 2022-09-21: 25 mg via INTRAVENOUS
  Filled 2022-09-21: qty 1

## 2022-09-21 MED ORDER — METHYLPREDNISOLONE SODIUM SUCC 125 MG IJ SOLR
125.0000 mg | Freq: Once | INTRAMUSCULAR | Status: AC
  Administered 2022-09-21: 125 mg via INTRAVENOUS
  Filled 2022-09-21: qty 2

## 2022-09-21 MED ORDER — DIPHENHYDRAMINE HCL 50 MG/ML IJ SOLN
25.0000 mg | Freq: Once | INTRAMUSCULAR | Status: AC
Start: 2022-09-21 — End: 2022-09-21
  Administered 2022-09-21: 25 mg via INTRAVENOUS
  Filled 2022-09-21: qty 1

## 2022-09-21 NOTE — ED Provider Notes (Signed)
[facility] EMERGENCY DEPT Provider Note   CSN: [id number] Arrival date & time: 09/21/22  1949     History  Chief Complaint  Patient presents with   Allergic Reaction    Courtney Steele is a 71 y.o. female.   Allergic Reaction Patient with known alpha gal allergy.  Had allergic reaction starting at about 7 PM.  Around 715 gave her EpiPen.  Had a rash shortness of breath.  Has had some wheezing initially.  Now just itching everywhere.  Has been admitted to hospital for allergy when she discovered it a few years ago.  No fevers or chills.  No difficulty breathing.  No vomiting.    Past Medical History:  Diagnosis Date   Borderline diabetes    CAD (coronary artery disease)    a. 2012: DES to LAD    COVID    Degenerative joint disease    Diabetes mellitus    Fibromyalgia    Groin hematoma    Hematoma    Radial catheter site July 13, 2011   Hyperlipidemia    Hypertension    Morbid obesity ([place])    Osteoarthritis     Home Medications Prior to Admission medications   Medication Sig Start Date End Date Taking? Authorizing Provider  albuterol (PROVENTIL HFA;VENTOLIN HFA) 108 (90 BASE) MCG/ACT inhaler Inhale 1-2 puffs into the lungs every 6 (six) hours as needed for wheezing or shortness of breath. 05/10/15   Debby Freiberg, MD  amLODipine (NORVASC) 2.5 MG tablet TAKE 1 TABLET(2.5 MG) BY MOUTH DAILY 01/03/22   Martinique, Peter M, MD  aspirin EC 81 MG tablet Take 81 mg by mouth daily.    [provider]  atorvastatin (LIPITOR) 40 MG tablet TAKE 1 TABLET(40 MG) BY MOUTH DAILY 12/06/21   Martinique, Peter M, MD  calcium carbonate 100 mg/ml SUSP Take by mouth.    [provider]  cholecalciferol (VITAMIN D3) 25 MCG (1000 UNIT) tablet 1 tablet    [provider]  diphenhydrAMINE (BENADRYL) 25 MG tablet Take 1 tablet (25 mg total) by mouth every 6 (six) hours for 5 days. 07/18/21 07/23/21  Fatima Blank, MD  doxycycline (VIBRAMYCIN) 100 MG  capsule Take 1 capsule (100 mg total) by mouth 2 (two) times daily. 08/06/22   Jaynee Eagles, PA-C  DULoxetine (CYMBALTA) 60 MG capsule TK 1 C PO QD 08/18/18   [provider]  empagliflozin (JARDIANCE) 10 MG TABS tablet Take by mouth daily.    [provider]  EPINEPHrine 0.3 mg/0.3 mL IJ SOAJ injection Inject 0.3 mLs (0.3 mg total) into the muscle once. 05/10/15   Debby Freiberg, MD  ergocalciferol (VITAMIN D2) 50000 UNITS capsule Take 50,000 Units by mouth once a week. Take on Saturday    [provider]  famotidine (PEPCID) 20 MG tablet Take 1 tablet (20 mg total) by mouth daily for 5 days. 07/18/21 07/23/21  Fatima Blank, MD  icosapent Ethyl (VASCEPA) 1 g capsule TAKE 2 CAPSULES BY MOUTH TWICE DAILY 02/23/22   Martinique, Peter M, MD  isosorbide mononitrate (IMDUR) 30 MG 24 hr tablet TAKE 1 TABLET(30 MG) BY MOUTH DAILY 07/02/22   Martinique, Peter M, MD  metoprolol succinate (TOPROL-XL) 25 MG 24 hr tablet TAKE 1 TABLET BY MOUTH DAILY 04/04/22   Martinique, Peter M, MD  nitroGLYCERIN (NITROSTAT) 0.4 MG SL tablet Place 1 tablet (0.4 mg total) under the tongue every 5 (five) minutes as needed. For chest pain 12/22/20   Martinique, Peter M, MD  pantoprazole (PROTONIX) 40 MG tablet TAKE 1 TABLET(40 MG) BY MOUTH DAILY 04/06/22   Martinique, Peter M, MD  pregabalin (LYRICA) 75 MG capsule TK 1 C PO BID 10/17/18   [provider]  ranolazine (RANEXA) 1000 MG SR tablet TAKE 1 TABLET(1000 MG) BY MOUTH TWICE DAILY 02/22/22   Martinique, Peter M, MD  traMADol (ULTRAM) 50 MG tablet Take 50 mg by mouth 3 (three) times daily as needed. For pain    [provider]  triamcinolone cream (KENALOG) 0.1 % Apply 1 Application topically 2 (two) times daily. 08/06/22   Jaynee Eagles, PA-C      Allergies    Beef-derived products, Other, Benazepril, Erythromycin, Indomethacin, Lisinopril, Biaxin [clarithromycin], Clarithromycin, and Penicillins    Review of Systems   Review of Systems  Physical  Exam Updated Vital Signs BP 138/65   Pulse 94   Temp 98.1 F (36.7 C)   Resp (!) 21   Wt 99.8 kg   SpO2 94%   BMI 38.97 kg/m  Physical Exam Vitals and nursing note reviewed.  HENT:     Head: Atraumatic.  Cardiovascular:     Rate and Rhythm: Tachycardia present.  Pulmonary:     Breath sounds: No wheezing.  Abdominal:     Tenderness: There is no abdominal tenderness.  Musculoskeletal:     Cervical back: Neck supple.  Skin:    General: Skin is warm.     Capillary Refill: Capillary refill takes less than 2 seconds.     Comments: Somewhat diffuse hives and patient is itching.  Neurological:     Mental Status: She is alert and oriented to person, place, and time.     ED Results / Procedures / Treatments   Labs (all labs ordered are listed, but only abnormal results are displayed) Labs Reviewed - No data to display  EKG None  Radiology No results found.  Procedures Procedures    Medications Ordered in ED Medications  diphenhydrAMINE (BENADRYL) injection 25 mg (25 mg Intravenous Given 09/21/22 2018)  famotidine (PEPCID) IVPB 20 mg premix (0 mg Intravenous Stopped 09/21/22 2053)  methylPREDNISolone sodium succinate (SOLU-MEDROL) 125 mg/2 mL injection 125 mg (125 mg Intravenous Given 09/21/22 2027)  diphenhydrAMINE (BENADRYL) injection 25 mg (25 mg Intravenous Given 09/21/22 2242)    ED Course/ Medical Decision Making/ A&P                           Medical Decision Making Risk Prescription drug management.   Patient with known food allergy.  Likely had exposure although patient states did not eat anything that she should have been an exposure.  Potentially some cross-contamination with the chicken she was eating.  Had EpiPen and breathing had improved.  Still itching however.  We will give steroids Pepcid and Benadryl.  We will continue to monitor.  Patient been doing better but then developed more of the redness on her face.  No airway involvement fact states  that itching of her tongue had improved.  However given another dose of Benadryl.        Final Clinical Impression(s) / ED Diagnoses Final diagnoses:  Allergic reaction, initial encounter    Rx / DC Orders ED Discharge Orders     None         Davonna Belling, MD 09/22/22 0005

## 2022-09-21 NOTE — ED Triage Notes (Signed)
Presents for allergic reaction from unknown cause requiring her to use her epi pen at [phone]. Rash, wheeze, and SOB have improved since this but still hoarse and with rash over neck and arms. Speaking in complete sentences.

## 2022-09-22 DIAGNOSIS — T7840XA Allergy, unspecified, initial encounter: Secondary | ICD-10-CM | POA: Diagnosis not present

## 2022-09-22 MED ORDER — PREDNISONE 20 MG PO TABS: 20.0000 mg | ORAL_TABLET | Freq: Every day | ORAL | 0 refills | Status: AC

## 2022-09-22 MED ORDER — EPINEPHRINE 0.3 MG/0.3ML IJ SOAJ: 0.3000 mg | INTRAMUSCULAR | 0 refills | Status: AC | PRN

## 2022-09-22 MED ORDER — HYDROXYZINE HCL 25 MG PO TABS: 25.0000 mg | ORAL_TABLET | Freq: Four times a day (QID) | ORAL | 0 refills | Status: DC | PRN

## 2022-09-22 NOTE — ED Provider Notes (Signed)
Blood pressure 136/66, pulse 95, temperature 98.1 F (36.7 C), temperature source Oral, resp. rate 20, weight 99.8 kg, SpO2 95 %.  Assuming care from Dr. Alvino Chapel.  In short, Courtney Steele is a 71 y.o. female with a chief complaint of Allergic Reaction .  Refer to the original H&P for additional details.  The current plan of care is to follow up after medications.  11:20 PM Patient with continued itching. No andioedema. Added IVF and atarax.   01:58 AM  Patient is feeling much better on reassessment.  Itching significantly reduced.  She has no longer erythematous/flushed.  No shortness of breath.  No hypotension or other change in vital signs.  She has an EpiPen at home and I will send a refill to her pharmacy to replace the one she used today.  Plan for steroid burst over the next couple of days and will send prescription for Atarax to help with itching.     Margette Fast, MD 09/22/22 [phone]

## 2022-09-22 NOTE — Discharge Instructions (Signed)
You are seen emergency room today after an apparent allergic reaction.  I have refilled your EpiPen.  Please keep 1 near you at all times in case this reaction returns.  I will have you on steroid for the next 3 days on a low-dose.  You may take Atarax as needed for itching but realize it may cause some drowsiness and should not be taken in addition to Benadryl as they are somewhat similar.  If you develop return of severe symptoms please return to the emergency department.  Please follow close with your primary care physician.

## 2022-09-25 ENCOUNTER — Ambulatory Visit: Payer: Self-pay | Admitting: Student

## 2022-09-25 NOTE — H&P (Signed)
TOTAL KNEE ADMISSION H&P  Patient is being admitted for right total knee arthroplasty.  Subjective:  Chief Complaint:right knee pain.  HPI: Courtney Steele, 71 y.o. female, has a history of pain and functional disability in the right knee due to arthritis and has failed non-surgical conservative treatments for greater than 12 weeks to includeNSAID's and/or analgesics, corticosteriod injections, viscosupplementation injections, flexibility and strengthening excercises, supervised PT with diminished ADL's post treatment, use of assistive devices, weight reduction as appropriate, and activity modification.  Onset of symptoms was gradual, starting 10 years ago with rapidlly worsening course since that time. The patient noted prior procedures on the knee to include  arthroscopy on the right knee(s).  Patient currently rates pain in the right knee(s) at 10 out of 10 with activity. Patient has night pain, worsening of pain with activity and weight bearing, pain that interferes with activities of daily living, pain with passive range of motion, crepitus, and joint swelling.  Patient has evidence of subchondral cysts, subchondral sclerosis, periarticular osteophytes, and joint space narrowing by imaging studies. There is no active infection.  Patient Active Problem List   Diagnosis Date Noted   Morbid obesity ([place])    Hypertension    Hyperlipidemia    Degenerative joint disease    CAD (coronary artery disease)    Borderline diabetes    Obesity (BMI 30-39.9) 11/14/2015   Pain in the chest    Unstable angina ([place]) 10/28/2015   Prediabetes 10/28/2015   Allergy with anaphylaxis due to food 08/08/2015   Osteoarthritis    Midsternal chest pain 02/23/2012   Dizziness 10/29/2011   CAD S/P LAD DES 2012    Essential hypertension    Dyslipidemia, goal LDL below 70    Fibromyalgia    Groin hematoma    Hematoma    Past Medical History:  Diagnosis Date   Borderline diabetes    CAD (coronary artery  disease)    a. 2012: DES to LAD    COVID    Degenerative joint disease    Depression    Diabetes mellitus    Fibromyalgia    GERD (gastroesophageal reflux disease)    Groin hematoma    Hematoma    Radial catheter site July 13, 2011   Hyperlipidemia    Hypertension    Morbid obesity ([place])    Osteoarthritis    Pneumonia     Past Surgical History:  Procedure Laterality Date   ABDOMINAL HYSTERECTOMY     ANGIOPLASTY     BUNIONECTOMY     CARDIAC CATHETERIZATION     revealing patency of the LAD stent with mild compromise of  the diagonal and moderate mid LAD stenosis, but does not appear to  be high grade.    CARDIAC CATHETERIZATION N/A 10/31/2015   Procedure: Left Heart Cath and Coronary Angiography;  Surgeon: Peter M Martinique, MD;  Location: [facility];  Service: Cardiovascular;  Laterality: N/A;   CARPAL TUNNEL RELEASE     CATARACT EXTRACTION     CHOLECYSTECTOMY     KNEE ARTHROSCOPY     right ulnar nerve transposition      TONSILLECTOMY      Current Outpatient Medications  Medication Sig Dispense Refill Last Dose   acetaminophen (TYLENOL) 500 MG tablet Take 500 mg by mouth every 6 (six) hours as needed for moderate pain.      albuterol (PROVENTIL HFA;VENTOLIN HFA) 108 (90 BASE) MCG/ACT inhaler Inhale 1-2 puffs into the lungs every 6 (six) hours as needed  for wheezing or shortness of breath. 1 Inhaler 0    amLODipine (NORVASC) 2.5 MG tablet TAKE 1 TABLET(2.5 MG) BY MOUTH DAILY 90 tablet 3    aspirin EC 81 MG tablet Take 81 mg by mouth daily.      atorvastatin (LIPITOR) 40 MG tablet TAKE 1 TABLET(40 MG) BY MOUTH DAILY 90 tablet 3    b complex vitamins capsule Take 1 capsule by mouth daily.      Calcium Carb-Cholecalciferol (CALCIUM + VITAMIN D3 PO) Take 1 tablet by mouth daily.      cholecalciferol (VITAMIN D3) 25 MCG (1000 UNIT) tablet Take 1,000 Units by mouth daily.      cyanocobalamin (VITAMIN B12) 500 MCG tablet Take 500 mcg by mouth daily.      cyclobenzaprine  (FLEXERIL) 5 MG tablet Take 5-10 mg by mouth 3 (three) times daily as needed for muscle spasms.      diphenhydrAMINE (BENADRYL) 25 MG tablet Take 1 tablet (25 mg total) by mouth every 6 (six) hours for 5 days. (Patient not taking: Reported on 09/28/2022) 20 tablet 0    doxycycline (VIBRAMYCIN) 100 MG capsule Take 1 capsule (100 mg total) by mouth 2 (two) times daily. (Patient not taking: Reported on 09/28/2022) 20 capsule 0    DULoxetine (CYMBALTA) 30 MG capsule Take 90 mg by mouth daily.  1    empagliflozin (JARDIANCE) 10 MG TABS tablet Take 10 mg by mouth daily.      EPINEPHrine 0.3 mg/0.3 mL IJ SOAJ injection Inject 0.3 mg into the muscle as needed for anaphylaxis. 1 each 0    famotidine (PEPCID) 20 MG tablet Take 1 tablet (20 mg total) by mouth daily for 5 days. 5 tablet 0    gabapentin (NEURONTIN) 100 MG capsule Take 200 mg by mouth at bedtime.      hydrOXYzine (ATARAX) 25 MG tablet Take 1 tablet (25 mg total) by mouth every 6 (six) hours as needed for itching. 12 tablet 0    icosapent Ethyl (VASCEPA) 1 g capsule TAKE 2 CAPSULES BY MOUTH TWICE DAILY 360 capsule 3    isosorbide mononitrate (IMDUR) 30 MG 24 hr tablet TAKE 1 TABLET(30 MG) BY MOUTH DAILY 90 tablet 3    MELATONIN PO Take 1 tablet by mouth daily.      metoprolol succinate (TOPROL-XL) 25 MG 24 hr tablet TAKE 1 TABLET BY MOUTH DAILY 90 tablet 3    nitroGLYCERIN (NITROSTAT) 0.4 MG SL tablet Place 1 tablet (0.4 mg total) under the tongue every 5 (five) minutes as needed. For chest pain 25 tablet 3    pantoprazole (PROTONIX) 40 MG tablet TAKE 1 TABLET(40 MG) BY MOUTH DAILY 90 tablet 3    pregabalin (LYRICA) 75 MG capsule Take 75 mg by mouth 2 (two) times daily.  4    ranolazine (RANEXA) 1000 MG SR tablet TAKE 1 TABLET(1000 MG) BY MOUTH TWICE DAILY 180 tablet 3    traMADol (ULTRAM) 50 MG tablet Take 50 mg by mouth 3 (three) times daily as needed for moderate pain.      triamcinolone cream (KENALOG) 0.1 % Apply 1 Application topically 2  (two) times daily. (Patient not taking: Reported on 09/28/2022) 30 g 0    TURMERIC PO Take 1 capsule by mouth daily.      No current facility-administered medications for this visit.   Allergies  Allergen Reactions   Beef-Derived Products Anaphylaxis   Other Anaphylaxis    No mammal protein substances   Benazepril Cough  Erythromycin Diarrhea   Indomethacin Diarrhea   Lisinopril Cough   Biaxin [Clarithromycin] Rash   Clarithromycin Rash   Penicillins Rash    Social History   Tobacco Use   Smoking status: Never   Smokeless tobacco: Never  Substance Use Topics   Alcohol use: Yes    Comment: rare    Family History  Problem Relation Age of Onset   Other Mother        died pulmonary issues   Coronary artery disease Father 4       died- CABG-HTN- AAA   Breast cancer Maternal Aunt        >50   Breast cancer Cousin 30     Review of Systems  Musculoskeletal:  Positive for arthralgias, gait problem and joint swelling.  All other systems reviewed and are negative.   Objective:  Physical Exam Constitutional:      Appearance: Normal appearance.  HENT:     Head: Normocephalic and atraumatic.     Nose: Nose normal.     Mouth/Throat:     Mouth: Mucous membranes are moist.     Pharynx: Oropharynx is clear.  Eyes:     Conjunctiva/sclera: Conjunctivae normal.  Cardiovascular:     Rate and Rhythm: Normal rate and regular rhythm.     Pulses: Normal pulses.     Heart sounds: Normal heart sounds.  Pulmonary:     Effort: Pulmonary effort is normal.     Breath sounds: Normal breath sounds.  Abdominal:     General: Abdomen is flat.     Palpations: Abdomen is soft.  Genitourinary:    Comments: deferred Musculoskeletal:     Cervical back: Normal range of motion and neck supple.     Comments: Examination of the right knee reveals no skin wounds or lesions. She has swelling, trace effusion. No warmth or erythema. Mild valgus deformity. Tenderness to palpation medial joint  line, lateral joint line, peripatellar retinacular tissues with a positive grind sign. Range of motion is 0 to 110 degrees without any ligamentous instability. Painless range of motion of the hip.  Neurovascular intact distally.  Mild LE swelling bilaterally at baseline.   Skin:    General: Skin is warm and dry.     Capillary Refill: Capillary refill takes less than 2 seconds.  Neurological:     General: No focal deficit present.     Mental Status: She is alert and oriented to person, place, and time.  Psychiatric:        Mood and Affect: Mood normal.        Behavior: Behavior normal.        Thought Content: Thought content normal.        Judgment: Judgment normal.     Vital signs in last 24 hours: '@VSRANGES'$ @  Labs:   Estimated body mass index is 38.26 kg/m as calculated from the following:   Height as of 10/02/22: '5\' 3"'$  (1.6 m).   Weight as of 10/02/22: 98 kg.   Imaging Review Plain radiographs demonstrate severe degenerative joint disease of the right knee(s). The overall alignment ismild valgus. The bone quality appears to be adequate for age and reported activity level.      Assessment/Plan:  End stage arthritis, right knee   The patient history, physical examination, clinical judgment of the provider and imaging studies are consistent with end stage degenerative joint disease of the right knee(s) and total knee arthroplasty is deemed medically necessary. The treatment options including medical management,  injection therapy arthroscopy and arthroplasty were discussed at length. The risks and benefits of total knee arthroplasty were presented and reviewed. The risks due to aseptic loosening, infection, stiffness, patella tracking problems, thromboembolic complications and other imponderables were discussed. The patient acknowledged the explanation, agreed to proceed with the plan and consent was signed. Patient is being admitted for inpatient treatment for surgery, pain  control, PT, OT, prophylactic antibiotics, VTE prophylaxis, progressive ambulation and ADL's and discharge planning. The patient is planning to be discharged home after an overnight stay with OPPT.   Therapy Plans: outpatient therapy at [facility] in [facility] 10/15/22.  Disposition: Home with husband Planned DVT Prophylaxis: aspirin '81mg'$  BID DME needed: Has rolling walker. Iceman today.  PCP: Cleared Cardiology: Cleared TXA: IV Allergies:  - Beef containing products - anaphylaxis - Biaxin (clarithromycin) - diarrhea - erythromycin - diarrhea - indomethacin - diarrhea - Penicillin - rash - lisinopril - cough Anesthesia Concerns: None.  BMI: 38.5 Last HgbA1c: 6.5 Other: - CAD with PCI and stent LAD - T2DM - Aspirin '81mg'$  at baseline.  - Nickel sensitivity - erythema and itching.  - Cr. 0.72, Hgb 13.2, K+ 3.9.  - Oxycodone, zofran, meloxicam.    Patient's anticipated LOS is less than 2 midnights, meeting these requirements: - Younger than 65 - Lives within 1 hour of care - Has a competent adult at home to recover with post-op recover - NO history of  - Chronic pain requiring opiods  - Diabetes  - Coronary Artery Disease  - Heart failure  - Heart attack  - Stroke  - DVT/VTE  - Cardiac arrhythmia  - Respiratory Failure/COPD  - Renal failure  - Anemia  - Advanced Liver disease

## 2022-09-25 NOTE — H&P (View-Only) (Signed)
TOTAL KNEE ADMISSION H&P  Patient is being admitted for right total knee arthroplasty.  Subjective:  Chief Complaint:right knee pain.  HPI: Courtney Steele, 71 y.o. female, has a history of pain and functional disability in the right knee due to arthritis and has failed non-surgical conservative treatments for greater than 12 weeks to includeNSAID's and/or analgesics, corticosteriod injections, viscosupplementation injections, flexibility and strengthening excercises, supervised PT with diminished ADL's post treatment, use of assistive devices, weight reduction as appropriate, and activity modification.  Onset of symptoms was gradual, starting 10 years ago with rapidlly worsening course since that time. The patient noted prior procedures on the knee to include  arthroscopy on the right knee(s).  Patient currently rates pain in the right knee(s) at 10 out of 10 with activity. Patient has night pain, worsening of pain with activity and weight bearing, pain that interferes with activities of daily living, pain with passive range of motion, crepitus, and joint swelling.  Patient has evidence of subchondral cysts, subchondral sclerosis, periarticular osteophytes, and joint space narrowing by imaging studies. There is no active infection.  Patient Active Problem List   Diagnosis Date Noted   Morbid obesity ([place])    Hypertension    Hyperlipidemia    Degenerative joint disease    CAD (coronary artery disease)    Borderline diabetes    Obesity (BMI 30-39.9) 11/14/2015   Pain in the chest    Unstable angina ([place]) 10/28/2015   Prediabetes 10/28/2015   Allergy with anaphylaxis due to food 08/08/2015   Osteoarthritis    Midsternal chest pain 02/23/2012   Dizziness 10/29/2011   CAD S/P LAD DES 2012    Essential hypertension    Dyslipidemia, goal LDL below 70    Fibromyalgia    Groin hematoma    Hematoma    Past Medical History:  Diagnosis Date   Borderline diabetes    CAD (coronary artery  disease)    a. 2012: DES to LAD    COVID    Degenerative joint disease    Depression    Diabetes mellitus    Fibromyalgia    GERD (gastroesophageal reflux disease)    Groin hematoma    Hematoma    Radial catheter site July 13, 2011   Hyperlipidemia    Hypertension    Morbid obesity ([place])    Osteoarthritis    Pneumonia     Past Surgical History:  Procedure Laterality Date   ABDOMINAL HYSTERECTOMY     ANGIOPLASTY     BUNIONECTOMY     CARDIAC CATHETERIZATION     revealing patency of the LAD stent with mild compromise of  the diagonal and moderate mid LAD stenosis, but does not appear to  be high grade.    CARDIAC CATHETERIZATION N/A 10/31/2015   Procedure: Left Heart Cath and Coronary Angiography;  Surgeon: Peter M Martinique, MD;  Location: [facility];  Service: Cardiovascular;  Laterality: N/A;   CARPAL TUNNEL RELEASE     CATARACT EXTRACTION     CHOLECYSTECTOMY     KNEE ARTHROSCOPY     right ulnar nerve transposition      TONSILLECTOMY      Current Outpatient Medications  Medication Sig Dispense Refill Last Dose   acetaminophen (TYLENOL) 500 MG tablet Take 500 mg by mouth every 6 (six) hours as needed for moderate pain.      albuterol (PROVENTIL HFA;VENTOLIN HFA) 108 (90 BASE) MCG/ACT inhaler Inhale 1-2 puffs into the lungs every 6 (six) hours as needed  for wheezing or shortness of breath. 1 Inhaler 0    amLODipine (NORVASC) 2.5 MG tablet TAKE 1 TABLET(2.5 MG) BY MOUTH DAILY 90 tablet 3    aspirin EC 81 MG tablet Take 81 mg by mouth daily.      atorvastatin (LIPITOR) 40 MG tablet TAKE 1 TABLET(40 MG) BY MOUTH DAILY 90 tablet 3    b complex vitamins capsule Take 1 capsule by mouth daily.      Calcium Carb-Cholecalciferol (CALCIUM + VITAMIN D3 PO) Take 1 tablet by mouth daily.      cholecalciferol (VITAMIN D3) 25 MCG (1000 UNIT) tablet Take 1,000 Units by mouth daily.      cyanocobalamin (VITAMIN B12) 500 MCG tablet Take 500 mcg by mouth daily.      cyclobenzaprine  (FLEXERIL) 5 MG tablet Take 5-10 mg by mouth 3 (three) times daily as needed for muscle spasms.      diphenhydrAMINE (BENADRYL) 25 MG tablet Take 1 tablet (25 mg total) by mouth every 6 (six) hours for 5 days. (Patient not taking: Reported on 09/28/2022) 20 tablet 0    doxycycline (VIBRAMYCIN) 100 MG capsule Take 1 capsule (100 mg total) by mouth 2 (two) times daily. (Patient not taking: Reported on 09/28/2022) 20 capsule 0    DULoxetine (CYMBALTA) 30 MG capsule Take 90 mg by mouth daily.  1    empagliflozin (JARDIANCE) 10 MG TABS tablet Take 10 mg by mouth daily.      EPINEPHrine 0.3 mg/0.3 mL IJ SOAJ injection Inject 0.3 mg into the muscle as needed for anaphylaxis. 1 each 0    famotidine (PEPCID) 20 MG tablet Take 1 tablet (20 mg total) by mouth daily for 5 days. 5 tablet 0    gabapentin (NEURONTIN) 100 MG capsule Take 200 mg by mouth at bedtime.      hydrOXYzine (ATARAX) 25 MG tablet Take 1 tablet (25 mg total) by mouth every 6 (six) hours as needed for itching. 12 tablet 0    icosapent Ethyl (VASCEPA) 1 g capsule TAKE 2 CAPSULES BY MOUTH TWICE DAILY 360 capsule 3    isosorbide mononitrate (IMDUR) 30 MG 24 hr tablet TAKE 1 TABLET(30 MG) BY MOUTH DAILY 90 tablet 3    MELATONIN PO Take 1 tablet by mouth daily.      metoprolol succinate (TOPROL-XL) 25 MG 24 hr tablet TAKE 1 TABLET BY MOUTH DAILY 90 tablet 3    nitroGLYCERIN (NITROSTAT) 0.4 MG SL tablet Place 1 tablet (0.4 mg total) under the tongue every 5 (five) minutes as needed. For chest pain 25 tablet 3    pantoprazole (PROTONIX) 40 MG tablet TAKE 1 TABLET(40 MG) BY MOUTH DAILY 90 tablet 3    pregabalin (LYRICA) 75 MG capsule Take 75 mg by mouth 2 (two) times daily.  4    ranolazine (RANEXA) 1000 MG SR tablet TAKE 1 TABLET(1000 MG) BY MOUTH TWICE DAILY 180 tablet 3    traMADol (ULTRAM) 50 MG tablet Take 50 mg by mouth 3 (three) times daily as needed for moderate pain.      triamcinolone cream (KENALOG) 0.1 % Apply 1 Application topically 2  (two) times daily. (Patient not taking: Reported on 09/28/2022) 30 g 0    TURMERIC PO Take 1 capsule by mouth daily.      No current facility-administered medications for this visit.   Allergies  Allergen Reactions   Beef-Derived Products Anaphylaxis   Other Anaphylaxis    No mammal protein substances   Benazepril Cough  Erythromycin Diarrhea   Indomethacin Diarrhea   Lisinopril Cough   Biaxin [Clarithromycin] Rash   Clarithromycin Rash   Penicillins Rash    Social History   Tobacco Use   Smoking status: Never   Smokeless tobacco: Never  Substance Use Topics   Alcohol use: Yes    Comment: rare    Family History  Problem Relation Age of Onset   Other Mother        died pulmonary issues   Coronary artery disease Father 55       died- CABG-HTN- AAA   Breast cancer Maternal Aunt        >50   Breast cancer Cousin 30     Review of Systems  Musculoskeletal:  Positive for arthralgias, gait problem and joint swelling.  All other systems reviewed and are negative.   Objective:  Physical Exam Constitutional:      Appearance: Normal appearance.  HENT:     Head: Normocephalic and atraumatic.     Nose: Nose normal.     Mouth/Throat:     Mouth: Mucous membranes are moist.     Pharynx: Oropharynx is clear.  Eyes:     Conjunctiva/sclera: Conjunctivae normal.  Cardiovascular:     Rate and Rhythm: Normal rate and regular rhythm.     Pulses: Normal pulses.     Heart sounds: Normal heart sounds.  Pulmonary:     Effort: Pulmonary effort is normal.     Breath sounds: Normal breath sounds.  Abdominal:     General: Abdomen is flat.     Palpations: Abdomen is soft.  Genitourinary:    Comments: deferred Musculoskeletal:     Cervical back: Normal range of motion and neck supple.     Comments: Examination of the right knee reveals no skin wounds or lesions. She has swelling, trace effusion. No warmth or erythema. Mild valgus deformity. Tenderness to palpation medial joint  line, lateral joint line, peripatellar retinacular tissues with a positive grind sign. Range of motion is 0 to 110 degrees without any ligamentous instability. Painless range of motion of the hip.  Neurovascular intact distally.  Mild LE swelling bilaterally at baseline.   Skin:    General: Skin is warm and dry.     Capillary Refill: Capillary refill takes less than 2 seconds.  Neurological:     General: No focal deficit present.     Mental Status: She is alert and oriented to person, place, and time.  Psychiatric:        Mood and Affect: Mood normal.        Behavior: Behavior normal.        Thought Content: Thought content normal.        Judgment: Judgment normal.     Vital signs in last 24 hours: '@VSRANGES'$ @  Labs:   Estimated body mass index is 38.26 kg/m as calculated from the following:   Height as of 10/02/22: '5\' 3"'$  (1.6 m).   Weight as of 10/02/22: 98 kg.   Imaging Review Plain radiographs demonstrate severe degenerative joint disease of the right knee(s). The overall alignment ismild valgus. The bone quality appears to be adequate for age and reported activity level.      Assessment/Plan:  End stage arthritis, right knee   The patient history, physical examination, clinical judgment of the provider and imaging studies are consistent with end stage degenerative joint disease of the right knee(s) and total knee arthroplasty is deemed medically necessary. The treatment options including medical management,  injection therapy arthroscopy and arthroplasty were discussed at length. The risks and benefits of total knee arthroplasty were presented and reviewed. The risks due to aseptic loosening, infection, stiffness, patella tracking problems, thromboembolic complications and other imponderables were discussed. The patient acknowledged the explanation, agreed to proceed with the plan and consent was signed. Patient is being admitted for inpatient treatment for surgery, pain  control, PT, OT, prophylactic antibiotics, VTE prophylaxis, progressive ambulation and ADL's and discharge planning. The patient is planning to be discharged home after an overnight stay with OPPT.   Therapy Plans: outpatient therapy at [facility] in [facility] 10/15/22.  Disposition: Home with husband Planned DVT Prophylaxis: aspirin '81mg'$  BID DME needed: Has rolling walker. Iceman today.  PCP: Cleared Cardiology: Cleared TXA: IV Allergies:  - Beef containing products - anaphylaxis - Biaxin (clarithromycin) - diarrhea - erythromycin - diarrhea - indomethacin - diarrhea - Penicillin - rash - lisinopril - cough Anesthesia Concerns: None.  BMI: 38.5 Last HgbA1c: 6.5 Other: - CAD with PCI and stent LAD - T2DM - Aspirin '81mg'$  at baseline.  - Nickel sensitivity - erythema and itching.  - Cr. 0.72, Hgb 13.2, K+ 3.9.  - Oxycodone, zofran, meloxicam.    Patient's anticipated LOS is less than 2 midnights, meeting these requirements: - Younger than 62 - Lives within 1 hour of care - Has a competent adult at home to recover with post-op recover - NO history of  - Chronic pain requiring opiods  - Diabetes  - Coronary Artery Disease  - Heart failure  - Heart attack  - Stroke  - DVT/VTE  - Cardiac arrhythmia  - Respiratory Failure/COPD  - Renal failure  - Anemia  - Advanced Liver disease

## 2022-09-28 NOTE — Progress Notes (Incomplete Revision)
Anesthesia Review:  PCP: DR Doristine Bosworth at [facility] 01/05/22  Cardiologist   DR Peter Martinique  Last visit - 08/31/22 Sharyne Peach for preop exam was virtual visit  Visit on chart  Chest x-ray : EKG : 12/21/21  Echo : Stress test: 01/06/21  Carotids- 05/07/21 Cardiac Cath :  2016  Activity level can do a flight of stairs wihtout difficulty   Sleep Study/ CPAP : none  Fasting Blood Sugar :      / Checks Blood Sugar -- times a day:   Blood Thinner/ Instructions /Last Dose: ASA / Instructions/ Last Dose :  DM- type 2 Checks glucose daily in am and then prn    Hgba1c- 10/02/22- 6.0   81 mg aspirin  Jardiance- To HOld for 72 hours prior to surgery.

## 2022-09-28 NOTE — Progress Notes (Signed)
Anesthesia Review:  PCP: Cardiologist : Chest x-ray : EKG : 12/21/21  Echo : Stress test: 01/06/21  Carotids- 05/07/21 Cardiac Cath :  2016  Activity level:  Sleep Study/ CPAP : Fasting Blood Sugar :      / Checks Blood Sugar -- times a day:   Blood Thinner/ Instructions /Last Dose: ASA / Instructions/ Last Dose :  DM- type    Hgba1c-   81 mg aspirin

## 2022-09-28 NOTE — Patient Instructions (Signed)
SURGICAL WAITING ROOM VISITATION Patients having surgery or a procedure may have no more than 2 support people in the waiting area - these visitors may rotate.   Children under the age of 40 must have an adult with them who is not the patient. If the patient needs to stay at the hospital during part of their recovery, the visitor guidelines for inpatient rooms apply. Pre-op nurse will coordinate an appropriate time for 1 support person to accompany patient in pre-op.  This support person may not rotate.    Please refer to the [facility] website for the visitor guidelines for Inpatients (after your surgery is over and you are in a regular room).       Your procedure is scheduled on:  10/11/2022    Report to [facility] Main Entrance    Report to admitting at   0800AM   Call this number if you have problems the morning of surgery [phone]   Do not eat food :After Midnight.   After Midnight you may have the following liquids until __ 0730____ AM/ PM DAY OF SURGERY  Water Non-Citrus Juices (without pulp, NO RED) Carbonated Beverages Black Coffee (NO MILK/CREAM OR CREAMERS, sugar ok)  Clear Tea (NO MILK/CREAM OR CREAMERS, sugar ok) regular and decaf                             Plain Jell-O (NO RED)                                           Fruit ices (not with fruit pulp, NO RED)                                     Popsicles (NO RED)                                                               Sports drinks like Gatorade (NO RED)                     The day of surgery:  Drink ONE (1) Pre-Surgery Clear Ensure or G2 at   0730AM  ( have completed by ) the morning of surgery. Drink in one sitting. Do not sip.  This drink was given to you during your hospital  pre-op appointment visit. Nothing else to drink after completing the  Pre-Surgery Clear Ensure or G2.          If you have questions, please contact your surgeon's office.        Oral Hygiene is also  important to reduce your risk of infection.                                    Remember - BRUSH YOUR TEETH THE MORNING OF SURGERY WITH YOUR REGULAR TOOTHPASTE   Do NOT smoke after Midnight   Take these medicines the morning of surgery with A SIP OF WATER:  inhalers as usual  and bring, amlodipine, pepcid, cymbalta, imdur, toproll, protoinix, lyrica, ranexa   DO NOT TAKE ANY ORAL DIABETIC MEDICATIONS DAY OF YOUR SURGERY  Bring CPAP mask and tubing day of surgery.                              You may not have any metal on your body including hair pins, jewelry, and body piercing             Do not wear make-up, lotions, powders, perfumes/cologne, or deodorant  Do not wear nail polish including gel and S&S, artificial/acrylic nails, or any other type of covering on natural nails including finger and toenails. If you have artificial nails, gel coating, etc. that needs to be removed by a nail salon please have this removed prior to surgery or surgery may need to be canceled/ delayed if the surgeon/ anesthesia feels like they are unable to be safely monitored.   Do not shave  48 hours prior to surgery.               Men may shave face and neck.   Do not bring valuables to the hospital. [place].   Contacts, dentures or bridgework may not be worn into surgery.   Bring small overnight bag day of surgery.   DO NOT [place]. PHARMACY WILL DISPENSE MEDICATIONS LISTED ON YOUR MEDICATION LIST TO YOU DURING YOUR ADMISSION [place]!    Patients discharged on the day of surgery will not be allowed to drive home.  Someone NEEDS to stay with you for the first 24 hours after anesthesia.   Special Instructions: Bring a copy of your healthcare power of attorney and living will documents the day of surgery if you haven't scanned them before.              Please read over the following fact sheets you were given:  IF [place] [phone]   If you received a COVID test during your pre-op visit  it is requested that you wear a mask when out in public, stay away from anyone that may not be feeling well and notify your surgeon if you develop symptoms. If you test positive for Covid or have been in contact with anyone that has tested positive in the last 10 days please notify you surgeon.     [facility] - Preparing for Surgery Before surgery, you can play an important role.  Because skin is not sterile, your skin needs to be as free of germs as possible.  You can reduce the number of germs on your skin by washing with CHG (chlorahexidine gluconate) soap before surgery.  CHG is an antiseptic cleaner which kills germs and bonds with the skin to continue killing germs even after washing. Please DO NOT use if you have an allergy to CHG or antibacterial soaps.  If your skin becomes reddened/irritated stop using the CHG and inform your nurse when you arrive at Short Stay. Do not shave (including legs and underarms) for at least 48 hours prior to the first CHG shower.  You may shave your face/neck. Please follow these instructions carefully:  1.  Shower with CHG Soap the night before surgery and the  morning of Surgery.  2.  If  you choose to wash your hair, wash your hair first as usual with your  normal  shampoo.  3.  After you shampoo, rinse your hair and body thoroughly to remove the  shampoo.                           4.  Use CHG as you would any other liquid soap.  You can apply chg directly  to the skin and wash                       Gently with a scrungie or clean washcloth.  5.  Apply the CHG Soap to your body ONLY FROM THE NECK DOWN.   Do not use on face/ open                           Wound or open sores. Avoid contact with eyes, ears mouth and genitals (private parts).                       Wash face,  Genitals (private parts) with your normal soap.              6.  Wash thoroughly, paying special attention to the area where your surgery  will be performed.  7.  Thoroughly rinse your body with warm water from the neck down.  8.  DO NOT shower/wash with your normal soap after using and rinsing off  the CHG Soap.                9.  Pat yourself dry with a clean towel.            10.  Wear clean pajamas.            11.  Place clean sheets on your bed the night of your first shower and do not  sleep with pets. Day of Surgery : Do not apply any lotions/deodorants the morning of surgery.  Please wear clean clothes to the hospital/surgery center.  FAILURE TO FOLLOW THESE INSTRUCTIONS MAY RESULT IN THE CANCELLATION OF YOUR SURGERY PATIENT SIGNATURE_________________________________  NURSE SIGNATURE__________________________________  ________________________________________________________________________

## 2022-10-02 ENCOUNTER — Other Ambulatory Visit: Payer: Self-pay

## 2022-10-02 ENCOUNTER — Encounter (HOSPITAL_COMMUNITY)
Admission: RE | Admit: 2022-10-02 | Discharge: 2022-10-02 | Disposition: A | Discharge status: Home / Self Care | Payer: Medicare Other | Source: Ambulatory Visit | Attending: Orthopedic Surgery | Admitting: Orthopedic Surgery

## 2022-10-02 ENCOUNTER — Encounter (HOSPITAL_COMMUNITY): Payer: Self-pay

## 2022-10-02 VITALS — BP 116/71 | HR 73 | Temp 98.3°F | Resp 16 | Ht 63.0 in | Wt 216.0 lb

## 2022-10-02 DIAGNOSIS — E139 Other specified diabetes mellitus without complications: Secondary | ICD-10-CM

## 2022-10-02 DIAGNOSIS — Z01812 Encounter for preprocedural laboratory examination: Secondary | ICD-10-CM | POA: Insufficient documentation

## 2022-10-02 DIAGNOSIS — Z01818 Encounter for other preprocedural examination: Secondary | ICD-10-CM

## 2022-10-02 DIAGNOSIS — E119 Type 2 diabetes mellitus without complications: Secondary | ICD-10-CM | POA: Diagnosis not present

## 2022-10-02 HISTORY — DX: Depression, unspecified: F32.A

## 2022-10-02 HISTORY — DX: Pneumonia, unspecified organism: J18.9

## 2022-10-02 HISTORY — DX: Gastro-esophageal reflux disease without esophagitis: K21.9

## 2022-10-02 LAB — HEMOGLOBIN A1C
Hgb A1c MFr Bld: 6 % — ABNORMAL HIGH (ref 4.8–5.6)
Mean Plasma Glucose: 125.5 mg/dL

## 2022-10-02 LAB — CBC
HCT: 41.1 % (ref 36.0–46.0)
Hemoglobin: 13.2 g/dL (ref 12.0–15.0)
MCH: 31 pg (ref 26.0–34.0)
MCHC: 32.1 g/dL (ref 30.0–36.0)
MCV: 96.5 fL (ref 80.0–100.0)
Platelets: 195 10*3/uL (ref 150–400)
RBC: 4.26 MIL/uL (ref 3.87–5.11)
RDW: 14.2 % (ref 11.5–15.5)
WBC: 6.2 10*3/uL (ref 4.0–10.5)
nRBC: 0 % (ref 0.0–0.2)

## 2022-10-02 LAB — BASIC METABOLIC PANEL
Anion gap: 9 (ref 5–15)
BUN: 19 mg/dL (ref 8–23)
CO2: 25 mmol/L (ref 22–32)
Calcium: 8.7 mg/dL — ABNORMAL LOW (ref 8.9–10.3)
Chloride: 107 mmol/L (ref 98–111)
Creatinine, Ser: 0.72 mg/dL (ref 0.44–1.00)
GFR, Estimated: >60 mL/min (ref >60)
Glucose, Bld: 128 mg/dL — ABNORMAL HIGH (ref 70–99)
Potassium: 3.9 mmol/L (ref 3.5–5.1)
Sodium: 141 mmol/L (ref 135–145)

## 2022-10-02 LAB — SURGICAL PCR SCREEN
MRSA, PCR: NEGATIVE
Staphylococcus aureus: POSITIVE — AB

## 2022-10-02 LAB — GLUCOSE, CAPILLARY: Glucose-Capillary: 114 mg/dL — ABNORMAL HIGH (ref 70–99)

## 2022-10-03 NOTE — Progress Notes (Signed)
Anesthesia Chart Review   Case: [id number] Date/Time: 10/11/22 1015   Procedure: COMPUTER ASSISTED TOTAL KNEE ARTHROPLASTY (Right: Knee) - 150   Anesthesia type: Spinal   Pre-op diagnosis: Right knee osteoarthritis   Location: [facility] ROOM 08 / [facility]   Surgeons: Rod Can, MD       DISCUSSION:71 y.o. never smoker with h/o HTN, DM II, HTN, CAD (DES to LAD 2012), right knee OA scheduled for above procedure 10/11/2022 with Dr. Rod Can.   Pt last seen by cardiology 08/31/2022. Per OV note, "Ms. Devincenzi's perioperative risk of a major cardiac event is 0.9% according to the Revised Cardiac Risk Index (RCRI).  Therefore, she is at low risk for perioperative complications.   Her functional capacity is good at 5.56 METs according to the Duke Activity Status Index (DASI). Recommendations: According to ACC/AHA guidelines, no further cardiovascular testing needed.  The patient may proceed to surgery at acceptable risk.   Antiplatelet and/or Anticoagulation Recommendations: Aspirin can be held for 7 days prior to her surgery.  Please resume Aspirin post operatively when it is felt to be safe from a bleeding standpoint."  Anticipate pt can proceed with planned procedure barring acute status change.   VS: BP 116/71   Pulse 73   Temp 36.8 C   Resp 16   Ht '5\' 3"'$  (1.6 m)   Wt 98 kg   SpO2 97%   BMI 38.26 kg/m   PROVIDERS: Kelton Pillar, MD is PCP   Cardiologist   DR Peter Martinique  LABS: Labs reviewed: Acceptable for surgery. (all labs ordered are listed, but only abnormal results are displayed)  Labs Reviewed  SURGICAL PCR SCREEN - Abnormal; Notable for the following components:      Result Value   Staphylococcus aureus POSITIVE (*)    All other components within normal limits  HEMOGLOBIN A1C - Abnormal; Notable for the following components:   Hgb A1c MFr Bld 6.0 (*)    All other components within normal limits  BASIC METABOLIC PANEL - Abnormal; Notable for the following components:    Glucose, Bld 128 (*)    Calcium 8.7 (*)    All other components within normal limits  GLUCOSE, CAPILLARY - Abnormal; Notable for the following components:   Glucose-Capillary 114 (*)    All other components within normal limits  CBC     IMAGES:   EKG:   CV: Myocardial Perfusion 01/06/2021 The left ventricular ejection fraction is hyperdynamic (>65%). Nuclear stress EF: 72%. There was no ST segment deviation noted during stress. No T wave inversion was noted during stress. The study is normal. This is a low risk study.  Cardiac Cath 10/31/2015 Prox RCA to Mid RCA lesion, 10% stenosed. Mid LAD lesion, 30% stenosed. The left ventricular systolic function is normal.   1. Nonobstructive CAD. The stent in the proximal LAD is widely patent.  2. Normal LV function.   Plan: continue medical therapy. Consider alternative causes for chest pain.    Past Medical History:  Diagnosis Date   Borderline diabetes    CAD (coronary artery disease)    a. 2012: DES to LAD    COVID    Degenerative joint disease    Depression    Diabetes mellitus    Fibromyalgia    GERD (gastroesophageal reflux disease)    Groin hematoma    Hematoma    Radial catheter site July 13, 2011   Hyperlipidemia    Hypertension    Morbid obesity ([place])  Osteoarthritis    Pneumonia     Past Surgical History:  Procedure Laterality Date   ABDOMINAL HYSTERECTOMY     ANGIOPLASTY     BUNIONECTOMY     CARDIAC CATHETERIZATION     revealing patency of the LAD stent with mild compromise of  the diagonal and moderate mid LAD stenosis, but does not appear to  be high grade.    CARDIAC CATHETERIZATION N/A 10/31/2015   Procedure: Left Heart Cath and Coronary Angiography;  Surgeon: Peter M Martinique, MD;  Location: [facility];  Service: Cardiovascular;  Laterality: N/A;   CARPAL TUNNEL RELEASE     CATARACT EXTRACTION     CHOLECYSTECTOMY     KNEE ARTHROSCOPY     right ulnar nerve transposition       TONSILLECTOMY      MEDICATIONS:  acetaminophen (TYLENOL) 500 MG tablet   albuterol (PROVENTIL HFA;VENTOLIN HFA) 108 (90 BASE) MCG/ACT inhaler   amLODipine (NORVASC) 2.5 MG tablet   aspirin EC 81 MG tablet   atorvastatin (LIPITOR) 40 MG tablet   b complex vitamins capsule   Calcium Carb-Cholecalciferol (CALCIUM + VITAMIN D3 PO)   cholecalciferol (VITAMIN D3) 25 MCG (1000 UNIT) tablet   cyanocobalamin (VITAMIN B12) 500 MCG tablet   cyclobenzaprine (FLEXERIL) 5 MG tablet   diphenhydrAMINE (BENADRYL) 25 MG tablet   doxycycline (VIBRAMYCIN) 100 MG capsule   DULoxetine (CYMBALTA) 30 MG capsule   empagliflozin (JARDIANCE) 10 MG TABS tablet   EPINEPHrine 0.3 mg/0.3 mL IJ SOAJ injection   famotidine (PEPCID) 20 MG tablet   gabapentin (NEURONTIN) 100 MG capsule   hydrOXYzine (ATARAX) 25 MG tablet   icosapent Ethyl (VASCEPA) 1 g capsule   isosorbide mononitrate (IMDUR) 30 MG 24 hr tablet   MELATONIN PO   metoprolol succinate (TOPROL-XL) 25 MG 24 hr tablet   nitroGLYCERIN (NITROSTAT) 0.4 MG SL tablet   pantoprazole (PROTONIX) 40 MG tablet   pregabalin (LYRICA) 75 MG capsule   ranolazine (RANEXA) 1000 MG SR tablet   traMADol (ULTRAM) 50 MG tablet   triamcinolone cream (KENALOG) 0.1 %   TURMERIC PO   No current facility-administered medications for this encounter.     Konrad Felix Ward, PA-C [facility] [phone]

## 2022-10-03 NOTE — Anesthesia Preprocedure Evaluation (Addendum)
Anesthesia Evaluation  Patient identified by MRN, date of birth, ID band Patient awake    Reviewed: Allergy & Precautions, NPO status , Patient's Chart, lab work & pertinent test results, reviewed documented beta blocker date and time   Airway Mallampati: II  TM Distance: >3 FB Neck ROM: Full    Dental  (+) Teeth Intact, Dental Advisory Given   Pulmonary neg pulmonary ROS   Pulmonary exam normal breath sounds clear to auscultation       Cardiovascular hypertension, Pt. on home beta blockers and Pt. on medications (-) angina + CAD and + Cardiac Stents (LAD)  (-) Past MI Normal cardiovascular exam Rhythm:Regular Rate:Normal     Neuro/Psych  PSYCHIATRIC DISORDERS  Depression    negative neurological ROS     GI/Hepatic Neg liver ROS,GERD  Medicated,,  Endo/Other  diabetes, Type 2, Oral Hypoglycemic Agents    Renal/GU negative Renal ROS     Musculoskeletal  (+) Arthritis ,    Abdominal   Peds  Hematology Plt 195k   Anesthesia Other Findings Day of surgery medications reviewed with the patient.  Reproductive/Obstetrics                             Anesthesia Physical Anesthesia Plan  ASA: 3  Anesthesia Plan: Spinal   Post-op Pain Management: Tylenol PO (pre-op)* and Regional block*   Induction: Intravenous  PONV Risk Score and Plan: 2 and Dexamethasone, TIVA and Ondansetron  Airway Management Planned: Natural Airway and Simple Face Mask  Additional Equipment:   Intra-op Plan:   Post-operative Plan:   Informed Consent: I have reviewed the patients History and Physical, chart, labs and discussed the procedure including the risks, benefits and alternatives for the proposed anesthesia with the patient or authorized representative who has indicated his/her understanding and acceptance.     Dental advisory given  Plan Discussed with: CRNA  Anesthesia Plan Comments: (See PAT note  10/02/2022)        Anesthesia Quick Evaluation

## 2022-10-09 ENCOUNTER — Telehealth: Payer: Self-pay

## 2022-10-09 NOTE — Telephone Encounter (Signed)
        Patient  visited [facility] on 09/22/2022  for Allergy reaction, unspecified, initial encounter.   Telephone encounter attempt :  1st  A HIPAA compliant voice message was left requesting a return call.  Instructed patient to call back at [phone].   [place] Resource Care Guide   [email or web address]  ?? [phone]   Website: [email or web address]  [email or web address]

## 2022-10-10 ENCOUNTER — Telehealth: Payer: Self-pay

## 2022-10-10 NOTE — Telephone Encounter (Signed)
        Patient  visited [facility] on 09/22/2022  for Allergy reaction, unspecified, initial encounter.   Telephone encounter attempt :  2nd  A HIPAA compliant voice message was left requesting a return call.  Instructed patient to call back at [phone].   [place] Resource Care Guide   [email or web address]  ?? [phone]   Website: [email or web address]  [email or web address]

## 2022-10-11 ENCOUNTER — Encounter (HOSPITAL_COMMUNITY): Payer: Self-pay | Admitting: Orthopedic Surgery

## 2022-10-11 ENCOUNTER — Ambulatory Visit (HOSPITAL_COMMUNITY): Payer: Medicare Other

## 2022-10-11 ENCOUNTER — Encounter (HOSPITAL_COMMUNITY)
Admission: RE | Disposition: A | Discharge status: Home / Self Care | Payer: Self-pay | Source: Home / Self Care | Attending: Orthopedic Surgery

## 2022-10-11 ENCOUNTER — Other Ambulatory Visit: Payer: Self-pay

## 2022-10-11 ENCOUNTER — Ambulatory Visit (HOSPITAL_COMMUNITY): Payer: Medicare Other | Admitting: Physician Assistant

## 2022-10-11 ENCOUNTER — Ambulatory Visit (HOSPITAL_COMMUNITY)
Admission: RE | Admit: 2022-10-11 | Discharge: 2022-10-12 | Disposition: A | Discharge status: Home / Self Care | Payer: Medicare Other | Attending: Orthopedic Surgery | Admitting: Orthopedic Surgery

## 2022-10-11 ENCOUNTER — Ambulatory Visit (HOSPITAL_BASED_OUTPATIENT_CLINIC_OR_DEPARTMENT_OTHER): Payer: Medicare Other | Admitting: Anesthesiology

## 2022-10-11 DIAGNOSIS — Z7984 Long term (current) use of oral hypoglycemic drugs: Secondary | ICD-10-CM | POA: Diagnosis not present

## 2022-10-11 DIAGNOSIS — K219 Gastro-esophageal reflux disease without esophagitis: Secondary | ICD-10-CM | POA: Diagnosis not present

## 2022-10-11 DIAGNOSIS — E119 Type 2 diabetes mellitus without complications: Secondary | ICD-10-CM | POA: Diagnosis not present

## 2022-10-11 DIAGNOSIS — M1711 Unilateral primary osteoarthritis, right knee: Secondary | ICD-10-CM

## 2022-10-11 DIAGNOSIS — F32A Depression, unspecified: Secondary | ICD-10-CM | POA: Insufficient documentation

## 2022-10-11 DIAGNOSIS — Z01818 Encounter for other preprocedural examination: Secondary | ICD-10-CM

## 2022-10-11 DIAGNOSIS — I10 Essential (primary) hypertension: Secondary | ICD-10-CM | POA: Insufficient documentation

## 2022-10-11 DIAGNOSIS — I2510 Atherosclerotic heart disease of native coronary artery without angina pectoris: Secondary | ICD-10-CM | POA: Insufficient documentation

## 2022-10-11 DIAGNOSIS — Z96651 Presence of right artificial knee joint: Secondary | ICD-10-CM

## 2022-10-11 DIAGNOSIS — G8918 Other acute postprocedural pain: Secondary | ICD-10-CM | POA: Diagnosis not present

## 2022-10-11 DIAGNOSIS — Z471 Aftercare following joint replacement surgery: Secondary | ICD-10-CM | POA: Diagnosis not present

## 2022-10-11 DIAGNOSIS — Z955 Presence of coronary angioplasty implant and graft: Secondary | ICD-10-CM | POA: Diagnosis not present

## 2022-10-11 HISTORY — PX: KNEE ARTHROPLASTY: SHX992

## 2022-10-11 LAB — GLUCOSE, CAPILLARY
Glucose-Capillary: 135 mg/dL — ABNORMAL HIGH (ref 70–99)
Glucose-Capillary: 134 mg/dL — ABNORMAL HIGH (ref 70–99)

## 2022-10-11 SURGERY — ARTHROPLASTY, KNEE, TOTAL, USING IMAGELESS COMPUTER-ASSISTED NAVIGATION
Anesthesia: Spinal | Site: Knee | Laterality: Right

## 2022-10-11 MED ORDER — ACETAMINOPHEN 500 MG PO TABS
ORAL_TABLET | ORAL | Status: AC
  Administered 2022-10-11: 1000 mg via ORAL
  Filled 2022-10-11: qty 2

## 2022-10-11 MED ORDER — ALBUTEROL SULFATE (2.5 MG/3ML) 0.083% IN NEBU: 2.5000 mg | INHALATION_SOLUTION | Freq: Four times a day (QID) | RESPIRATORY_TRACT | Status: DC | PRN

## 2022-10-11 MED ORDER — FENTANYL CITRATE PF 50 MCG/ML IJ SOSY
50.0000 ug | PREFILLED_SYRINGE | INTRAMUSCULAR | Status: AC
  Administered 2022-10-11: 50 ug via INTRAVENOUS
  Filled 2022-10-11: qty 2

## 2022-10-11 MED ORDER — LACTATED RINGERS IV SOLN: INTRAVENOUS | Status: DC

## 2022-10-11 MED ORDER — METOCLOPRAMIDE HCL 5 MG/ML IJ SOLN: 5.0000 mg | Freq: Three times a day (TID) | INTRAMUSCULAR | Status: DC | PRN

## 2022-10-11 MED ORDER — PREGABALIN 75 MG PO CAPS: 75.0000 mg | ORAL_CAPSULE | Freq: Two times a day (BID) | ORAL | Status: DC

## 2022-10-11 MED ORDER — DOCUSATE SODIUM 100 MG PO CAPS
100.0000 mg | ORAL_CAPSULE | Freq: Two times a day (BID) | ORAL | Status: DC
  Administered 2022-10-11 – 2022-10-12 (×2): 100 mg via ORAL
  Filled 2022-10-11 (×2): qty 1

## 2022-10-11 MED ORDER — VITAMIN D 25 MCG (1000 UNIT) PO TABS
1000.0000 [IU] | ORAL_TABLET | Freq: Every day | ORAL | Status: DC
  Administered 2022-10-11 – 2022-10-12 (×2): 1000 [IU] via ORAL
  Filled 2022-10-11 (×2): qty 1

## 2022-10-11 MED ORDER — AMLODIPINE BESYLATE 5 MG PO TABS
2.5000 mg | ORAL_TABLET | Freq: Every day | ORAL | Status: DC
  Administered 2022-10-11 – 2022-10-12 (×2): 2.5 mg via ORAL
  Filled 2022-10-11 (×2): qty 1

## 2022-10-11 MED ORDER — BUPIVACAINE HCL (PF) 0.25 % IJ SOLN
INTRAMUSCULAR | Status: AC
  Filled 2022-10-11: qty 30

## 2022-10-11 MED ORDER — FENTANYL CITRATE PF 50 MCG/ML IJ SOSY: 25.0000 ug | PREFILLED_SYRINGE | INTRAMUSCULAR | Status: DC | PRN

## 2022-10-11 MED ORDER — SODIUM CHLORIDE 0.9 % IV SOLN: INTRAVENOUS | Status: DC

## 2022-10-11 MED ORDER — PROPOFOL 500 MG/50ML IV EMUL
INTRAVENOUS | Status: DC | PRN
  Administered 2022-10-11: 100 ug/kg/min via INTRAVENOUS

## 2022-10-11 MED ORDER — DIPHENHYDRAMINE HCL 12.5 MG/5ML PO ELIX: 12.5000 mg | ORAL_SOLUTION | ORAL | Status: DC | PRN

## 2022-10-11 MED ORDER — ORAL CARE MOUTH RINSE: 15.0000 mL | Freq: Once | OROMUCOSAL | Status: DC

## 2022-10-11 MED ORDER — PANTOPRAZOLE SODIUM 40 MG PO TBEC
40.0000 mg | DELAYED_RELEASE_TABLET | Freq: Every day | ORAL | Status: DC
  Administered 2022-10-11 – 2022-10-12 (×2): 40 mg via ORAL
  Filled 2022-10-11 (×2): qty 1

## 2022-10-11 MED ORDER — DULOXETINE HCL 60 MG PO CPEP
90.0000 mg | ORAL_CAPSULE | Freq: Every day | ORAL | Status: DC
  Administered 2022-10-11 – 2022-10-12 (×2): 90 mg via ORAL
  Filled 2022-10-11 (×2): qty 1

## 2022-10-11 MED ORDER — SODIUM CHLORIDE 0.9% FLUSH
INTRAVENOUS | Status: DC | PRN
  Administered 2022-10-11: 30 mL

## 2022-10-11 MED ORDER — ATORVASTATIN CALCIUM 40 MG PO TABS
40.0000 mg | ORAL_TABLET | Freq: Every day | ORAL | Status: DC
  Administered 2022-10-11 – 2022-10-12 (×2): 40 mg via ORAL
  Filled 2022-10-11 (×2): qty 1

## 2022-10-11 MED ORDER — METOCLOPRAMIDE HCL 5 MG PO TABS: 5.0000 mg | ORAL_TABLET | Freq: Three times a day (TID) | ORAL | Status: DC | PRN

## 2022-10-11 MED ORDER — EPHEDRINE SULFATE-NACL 50-0.9 MG/10ML-% IV SOSY
PREFILLED_SYRINGE | INTRAVENOUS | Status: DC | PRN
  Administered 2022-10-11 (×2): 5 mg via INTRAVENOUS

## 2022-10-11 MED ORDER — OXYCODONE HCL 5 MG PO TABS
10.0000 mg | ORAL_TABLET | ORAL | Status: DC | PRN
  Administered 2022-10-11 – 2022-10-12 (×4): 10 mg via ORAL
  Filled 2022-10-11 (×2): qty 2

## 2022-10-11 MED ORDER — DEXAMETHASONE SODIUM PHOSPHATE 10 MG/ML IJ SOLN
INTRAMUSCULAR | Status: AC
  Filled 2022-10-11: qty 1

## 2022-10-11 MED ORDER — OXYCODONE HCL 5 MG PO TABS
5.0000 mg | ORAL_TABLET | ORAL | Status: DC | PRN
  Administered 2022-10-12: 10 mg via ORAL
  Administered 2022-10-12: 5 mg via ORAL
  Filled 2022-10-11: qty 2
  Filled 2022-10-11: qty 1
  Filled 2022-10-11 (×2): qty 2

## 2022-10-11 MED ORDER — SODIUM CHLORIDE (PF) 0.9 % IJ SOLN
INTRAMUSCULAR | Status: AC
  Filled 2022-10-11: qty 50

## 2022-10-11 MED ORDER — ALUM & MAG HYDROXIDE-SIMETH 200-200-20 MG/5ML PO SUSP: 30.0000 mL | ORAL | Status: DC | PRN

## 2022-10-11 MED ORDER — ONDANSETRON HCL 4 MG/2ML IJ SOLN
INTRAMUSCULAR | Status: DC | PRN
  Administered 2022-10-11: 4 mg via INTRAVENOUS

## 2022-10-11 MED ORDER — BUPIVACAINE-EPINEPHRINE 0.5% -1:200000 IJ SOLN
INTRAMUSCULAR | Status: DC | PRN
  Administered 2022-10-11: 30 mL

## 2022-10-11 MED ORDER — CHLORHEXIDINE GLUCONATE 0.12 % MT SOLN: 15.0000 mL | Freq: Once | OROMUCOSAL | Status: DC

## 2022-10-11 MED ORDER — PHENYLEPHRINE 80 MCG/ML (10ML) SYRINGE FOR IV PUSH (FOR BLOOD PRESSURE SUPPORT)
PREFILLED_SYRINGE | INTRAVENOUS | Status: DC | PRN
  Administered 2022-10-11: 80 ug via INTRAVENOUS
  Administered 2022-10-11: 160 ug via INTRAVENOUS

## 2022-10-11 MED ORDER — TRANEXAMIC ACID-NACL 1000-0.7 MG/100ML-% IV SOLN
INTRAVENOUS | Status: AC
  Filled 2022-10-11: qty 100

## 2022-10-11 MED ORDER — ACETAMINOPHEN 325 MG PO TABS
325.0000 mg | ORAL_TABLET | Freq: Four times a day (QID) | ORAL | Status: DC | PRN
  Filled 2022-10-11: qty 2

## 2022-10-11 MED ORDER — SODIUM CHLORIDE 0.9 % IR SOLN
Status: DC | PRN
  Administered 2022-10-11 (×2): 1000 mL

## 2022-10-11 MED ORDER — TRANEXAMIC ACID-NACL 1000-0.7 MG/100ML-% IV SOLN
1000.0000 mg | INTRAVENOUS | Status: AC
  Administered 2022-10-11: 1000 mg via INTRAVENOUS

## 2022-10-11 MED ORDER — HYDROMORPHONE HCL 1 MG/ML IJ SOLN: 0.5000 mg | INTRAMUSCULAR | Status: DC | PRN

## 2022-10-11 MED ORDER — DEXAMETHASONE SODIUM PHOSPHATE 10 MG/ML IJ SOLN
INTRAMUSCULAR | Status: DC | PRN
  Administered 2022-10-11: 10 mg via INTRAVENOUS

## 2022-10-11 MED ORDER — PHENYLEPHRINE HCL-NACL 20-0.9 MG/250ML-% IV SOLN
INTRAVENOUS | Status: DC | PRN
  Administered 2022-10-11: 25 ug/min via INTRAVENOUS

## 2022-10-11 MED ORDER — ACETAMINOPHEN 500 MG PO TABS: 1000.0000 mg | ORAL_TABLET | Freq: Once | ORAL | Status: AC

## 2022-10-11 MED ORDER — SENNA 8.6 MG PO TABS
1.0000 | ORAL_TABLET | Freq: Two times a day (BID) | ORAL | Status: DC
  Administered 2022-10-11 – 2022-10-12 (×2): 8.6 mg via ORAL
  Filled 2022-10-11 (×2): qty 1

## 2022-10-11 MED ORDER — CEFAZOLIN SODIUM-DEXTROSE 2-4 GM/100ML-% IV SOLN
2.0000 g | Freq: Four times a day (QID) | INTRAVENOUS | Status: AC
  Administered 2022-10-11 (×2): 2 g via INTRAVENOUS
  Filled 2022-10-11 (×2): qty 100

## 2022-10-11 MED ORDER — METHOCARBAMOL 500 MG PO TABS
500.0000 mg | ORAL_TABLET | Freq: Four times a day (QID) | ORAL | Status: DC | PRN
  Administered 2022-10-11 – 2022-10-12 (×2): 500 mg via ORAL
  Filled 2022-10-11 (×2): qty 1

## 2022-10-11 MED ORDER — ROPIVACAINE HCL 5 MG/ML IJ SOLN
INTRAMUSCULAR | Status: DC | PRN
  Administered 2022-10-11: 20 mL via PERINEURAL

## 2022-10-11 MED ORDER — NITROGLYCERIN 0.4 MG SL SUBL: 0.4000 mg | SUBLINGUAL_TABLET | SUBLINGUAL | Status: DC | PRN

## 2022-10-11 MED ORDER — POLYETHYLENE GLYCOL 3350 17 G PO PACK: 17.0000 g | PACK | Freq: Every day | ORAL | Status: DC | PRN

## 2022-10-11 MED ORDER — ASPIRIN 81 MG PO CHEW
81.0000 mg | CHEWABLE_TABLET | Freq: Two times a day (BID) | ORAL | Status: DC
  Administered 2022-10-11 – 2022-10-12 (×2): 81 mg via ORAL
  Filled 2022-10-11 (×2): qty 1

## 2022-10-11 MED ORDER — KETOROLAC TROMETHAMINE 30 MG/ML IJ SOLN
INTRAMUSCULAR | Status: DC | PRN
  Administered 2022-10-11: 30 mg via INTRAMUSCULAR

## 2022-10-11 MED ORDER — POVIDONE-IODINE 10 % EX SWAB
2.0000 | Freq: Once | CUTANEOUS | Status: AC
  Administered 2022-10-11: 2 via TOPICAL

## 2022-10-11 MED ORDER — ACETAMINOPHEN 500 MG PO TABS: 1000.0000 mg | ORAL_TABLET | Freq: Once | ORAL | Status: DC

## 2022-10-11 MED ORDER — BUPIVACAINE-EPINEPHRINE (PF) 0.5% -1:200000 IJ SOLN
INTRAMUSCULAR | Status: AC
  Filled 2022-10-11: qty 30

## 2022-10-11 MED ORDER — MENTHOL 3 MG MT LOZG: 1.0000 | LOZENGE | OROMUCOSAL | Status: DC | PRN

## 2022-10-11 MED ORDER — PHENOL 1.4 % MT LIQD: 1.0000 | OROMUCOSAL | Status: DC | PRN

## 2022-10-11 MED ORDER — ISOPROPYL ALCOHOL 70 % SOLN
Status: DC | PRN
  Administered 2022-10-11: 1 via TOPICAL

## 2022-10-11 MED ORDER — MIDAZOLAM HCL 2 MG/2ML IJ SOLN
1.0000 mg | INTRAMUSCULAR | Status: AC
  Administered 2022-10-11: 2 mg via INTRAVENOUS
  Filled 2022-10-11: qty 2

## 2022-10-11 MED ORDER — CEFAZOLIN SODIUM-DEXTROSE 2-4 GM/100ML-% IV SOLN
INTRAVENOUS | Status: AC
  Filled 2022-10-11: qty 100

## 2022-10-11 MED ORDER — METOPROLOL SUCCINATE ER 25 MG PO TB24
25.0000 mg | ORAL_TABLET | Freq: Every day | ORAL | Status: DC
  Administered 2022-10-11 – 2022-10-12 (×2): 25 mg via ORAL
  Filled 2022-10-11 (×2): qty 1

## 2022-10-11 MED ORDER — MIDAZOLAM HCL 2 MG/2ML IJ SOLN
INTRAMUSCULAR | Status: AC
  Filled 2022-10-11: qty 2

## 2022-10-11 MED ORDER — 0.9 % SODIUM CHLORIDE (POUR BTL) OPTIME
TOPICAL | Status: DC | PRN
  Administered 2022-10-11: 1000 mL

## 2022-10-11 MED ORDER — CELECOXIB 200 MG PO CAPS
200.0000 mg | ORAL_CAPSULE | Freq: Two times a day (BID) | ORAL | Status: DC
  Administered 2022-10-11 – 2022-10-12 (×2): 200 mg via ORAL
  Filled 2022-10-11 (×2): qty 1

## 2022-10-11 MED ORDER — POVIDONE-IODINE 10 % EX SWAB: 2.0000 | Freq: Once | CUTANEOUS | Status: DC

## 2022-10-11 MED ORDER — EPINEPHRINE PF 1 MG/ML IJ SOLN
INTRAMUSCULAR | Status: AC
  Filled 2022-10-11: qty 1

## 2022-10-11 MED ORDER — ONDANSETRON HCL 4 MG PO TABS: 4.0000 mg | ORAL_TABLET | Freq: Four times a day (QID) | ORAL | Status: DC | PRN

## 2022-10-11 MED ORDER — CEFAZOLIN SODIUM-DEXTROSE 2-4 GM/100ML-% IV SOLN
2.0000 g | INTRAVENOUS | Status: AC
  Administered 2022-10-11: 2 g via INTRAVENOUS

## 2022-10-11 MED ORDER — KETOROLAC TROMETHAMINE 30 MG/ML IJ SOLN
INTRAMUSCULAR | Status: AC
  Filled 2022-10-11: qty 1

## 2022-10-11 MED ORDER — ONDANSETRON HCL 4 MG/2ML IJ SOLN: 4.0000 mg | Freq: Once | INTRAMUSCULAR | Status: DC | PRN

## 2022-10-11 MED ORDER — ONDANSETRON HCL 4 MG/2ML IJ SOLN: 4.0000 mg | Freq: Four times a day (QID) | INTRAMUSCULAR | Status: DC | PRN

## 2022-10-11 MED ORDER — METHOCARBAMOL 1000 MG/10ML IJ SOLN: 500.0000 mg | Freq: Four times a day (QID) | INTRAVENOUS | Status: DC | PRN

## 2022-10-11 MED ORDER — RANOLAZINE ER 500 MG PO TB12
1000.0000 mg | ORAL_TABLET | Freq: Two times a day (BID) | ORAL | Status: DC
  Administered 2022-10-11 – 2022-10-12 (×2): 1000 mg via ORAL
  Filled 2022-10-11 (×2): qty 2

## 2022-10-11 MED ORDER — GABAPENTIN 100 MG PO CAPS
200.0000 mg | ORAL_CAPSULE | Freq: Every day | ORAL | Status: DC
  Administered 2022-10-11: 200 mg via ORAL
  Filled 2022-10-11: qty 2

## 2022-10-11 MED ORDER — ONDANSETRON HCL 4 MG/2ML IJ SOLN
INTRAMUSCULAR | Status: AC
  Filled 2022-10-11: qty 2

## 2022-10-11 MED ORDER — HYDROXYZINE HCL 25 MG PO TABS: 25.0000 mg | ORAL_TABLET | Freq: Four times a day (QID) | ORAL | Status: DC | PRN

## 2022-10-11 MED ORDER — VITAMIN B-12 1000 MCG PO TABS
500.0000 ug | ORAL_TABLET | Freq: Every day | ORAL | Status: DC
  Administered 2022-10-11 – 2022-10-12 (×2): 500 ug via ORAL
  Filled 2022-10-11 (×2): qty 1

## 2022-10-11 MED ORDER — STERILE WATER FOR IRRIGATION IR SOLN
Status: DC | PRN
  Administered 2022-10-11: 2000 mL

## 2022-10-11 SURGICAL SUPPLY — 69 items
ADH SKN CLS APL DERMABOND .7 (GAUZE/BANDAGES/DRESSINGS) ×2
APL PRP STRL LF DISP 70% ISPRP (MISCELLANEOUS) ×2
BAG COUNTER SPONGE SURGICOUNT (BAG) IMPLANT
BAG SPEC THK2 15X12 ZIP CLS (MISCELLANEOUS)
BAG SPNG CNTER NS LX DISP (BAG) ×1
BAG ZIPLOCK 12X15 (MISCELLANEOUS) IMPLANT
BATTERY INSTRU NAVIGATION (MISCELLANEOUS) ×3 IMPLANT
BLADE SAW RECIPROCATING 77.5 (BLADE) ×1 IMPLANT
BNDG ELASTIC 4X5.8 VLCR STR LF (GAUZE/BANDAGES/DRESSINGS) ×1 IMPLANT
BNDG ELASTIC 6X5.8 VLCR STR LF (GAUZE/BANDAGES/DRESSINGS) ×1 IMPLANT
BTRY SRG DRVR LF (MISCELLANEOUS) ×3
CEMENT BONE REFOBACIN R1X40 US (Cement) IMPLANT
CHLORAPREP W/TINT 26 (MISCELLANEOUS) ×2 IMPLANT
COMP FEM CMT KNEE 7 STD RT (Joint) ×1 IMPLANT
COMP PATELLAR 10X35 METAL (Joint) ×1 IMPLANT
COMPONENT FEM CMT KN 7 STD RT (Joint) IMPLANT
COMPONENT PATELLAR 10X35 METAL (Joint) IMPLANT
COVER SURGICAL LIGHT HANDLE (MISCELLANEOUS) ×1 IMPLANT
DERMABOND ADVANCED .7 DNX12 (GAUZE/BANDAGES/DRESSINGS) ×2 IMPLANT
DRAPE INCISE IOBAN 66X45 STRL (DRAPES) ×1 IMPLANT
DRAPE SHEET LG 3/4 BI-LAMINATE (DRAPES) ×3 IMPLANT
DRAPE U-SHAPE 47X51 STRL (DRAPES) ×1 IMPLANT
DRSG AQUACEL AG ADV 3.5X10 (GAUZE/BANDAGES/DRESSINGS) ×1 IMPLANT
ELECT BLADE TIP CTD 4 INCH (ELECTRODE) ×1 IMPLANT
ELECT REM PT RETURN 15FT ADLT (MISCELLANEOUS) ×1 IMPLANT
GAUZE SPONGE 4X4 12PLY STRL (GAUZE/BANDAGES/DRESSINGS) ×1 IMPLANT
GLOVE BIO SURGEON STRL SZ7 (GLOVE) ×1 IMPLANT
GLOVE BIO SURGEON STRL SZ8.5 (GLOVE) ×2 IMPLANT
GLOVE BIOGEL PI IND STRL 7.5 (GLOVE) ×1 IMPLANT
GLOVE BIOGEL PI IND STRL 8.5 (GLOVE) ×1 IMPLANT
GOWN SPEC L3 XXLG W/TWL (GOWN DISPOSABLE) ×1 IMPLANT
GOWN STRL REUS W/ TWL XL LVL3 (GOWN DISPOSABLE) ×1 IMPLANT
GOWN STRL REUS W/TWL XL LVL3 (GOWN DISPOSABLE) ×1
HANDPIECE INTERPULSE COAX TIP (DISPOSABLE) ×1
HDLS TROCR DRIL PIN KNEE 75 (PIN) ×1
HOLDER FOLEY CATH W/STRAP (MISCELLANEOUS) ×1 IMPLANT
HOOD PEEL AWAY T7 (MISCELLANEOUS) ×3 IMPLANT
INSERT TIB ASF VIV SZ 6-7 12H (Insert) IMPLANT
KIT TURNOVER KIT A (KITS) IMPLANT
MARKER SKIN DUAL TIP RULER LAB (MISCELLANEOUS) ×1 IMPLANT
NDL SAFETY ECLIP 18X1.5 (MISCELLANEOUS) ×1 IMPLANT
NDL SPNL 18GX3.5 QUINCKE PK (NEEDLE) ×1 IMPLANT
NEEDLE SPNL 18GX3.5 QUINCKE PK (NEEDLE) ×1 IMPLANT
NS IRRIG 1000ML POUR BTL (IV SOLUTION) ×1 IMPLANT
PACK TOTAL KNEE CUSTOM (KITS) ×1 IMPLANT
PADDING CAST COTTON 6X4 STRL (CAST SUPPLIES) ×1 IMPLANT
PIN DRILL HDLS TROCAR 75 4PK (PIN) IMPLANT
PROTECTOR NERVE ULNAR (MISCELLANEOUS) ×1 IMPLANT
SAW OSC TIP CART 19.5X105X1.3 (SAW) ×1 IMPLANT
SCREW FEMALE HEX FIX 25X2.5 (ORTHOPEDIC DISPOSABLE SUPPLIES) IMPLANT
SEALER BIPOLAR AQUA 6.0 (INSTRUMENTS) ×1 IMPLANT
SET HNDPC FAN SPRY TIP SCT (DISPOSABLE) ×1 IMPLANT
SET PAD KNEE POSITIONER (MISCELLANEOUS) ×1 IMPLANT
SOLUTION PRONTOSAN WOUND 350ML (IRRIGATION / IRRIGATOR) IMPLANT
STEM TIB PS KNEE D 0D RT (Stem) IMPLANT
SUT MNCRL AB 3-0 PS2 18 (SUTURE) ×1 IMPLANT
SUT MNCRL AB 4-0 PS2 18 (SUTURE) IMPLANT
SUT MON AB 2-0 CT1 36 (SUTURE) ×1 IMPLANT
SUT STRATAFIX PDO 1 14 VIOLET (SUTURE) ×1
SUT STRATFX PDO 1 14 VIOLET (SUTURE) ×1
SUT VIC AB 1 CTX 36 (SUTURE) ×2
SUT VIC AB 1 CTX36XBRD ANBCTR (SUTURE) ×2 IMPLANT
SUT VIC AB 2-0 CT1 27 (SUTURE)
SUT VIC AB 2-0 CT1 TAPERPNT 27 (SUTURE) IMPLANT
SUTURE STRATFX PDO 1 14 VIOLET (SUTURE) ×1 IMPLANT
TRAY FOLEY MTR SLVR 14FR STAT (SET/KITS/TRAYS/PACK) IMPLANT
TUBE SUCTION HIGH CAP CLEAR NV (SUCTIONS) ×1 IMPLANT
WATER STERILE IRR 1000ML POUR (IV SOLUTION) ×2 IMPLANT
WRAP KNEE MAXI GEL POST OP (GAUZE/BANDAGES/DRESSINGS) IMPLANT

## 2022-10-11 NOTE — Anesthesia Postprocedure Evaluation (Signed)
Anesthesia Post Note  Patient: Courtney Steele  Procedure(s) Performed: COMPUTER ASSISTED TOTAL KNEE ARTHROPLASTY (Right: Knee)     Patient location during evaluation: PACU Anesthesia Type: Spinal Level of consciousness: awake, awake and alert and oriented Pain management: pain level controlled Vital Signs Assessment: post-procedure vital signs reviewed and stable Respiratory status: spontaneous breathing, nonlabored ventilation and respiratory function stable Cardiovascular status: blood pressure returned to baseline and stable Postop Assessment: no headache, no backache, spinal receding and no apparent nausea or vomiting Anesthetic complications: no   No notable events documented.  Last Vitals:  Vitals:   10/11/22 1415 10/11/22 1417  BP: 117/61   Pulse: 64 60  Resp: 12 12  Temp:    SpO2: (!) 89% 94%    Last Pain:  Vitals:   10/11/22 1415  TempSrc:   PainSc: [place] Stanely Sexson

## 2022-10-11 NOTE — Evaluation (Signed)
Physical Therapy Evaluation Patient Details Name: Courtney Steele MRN: [id number] DOB: 23-Oct-1951 Today's Date: 10/11/2022  History of Present Illness  Pt s/p R TKR and with hx of DJD, CAD, DM, obesity and Fibromyalgia  Clinical Impression  Pt s/p R TKR and presents with decreased R LE strength/ROM and post op pain limiting functional mobility.  Pt should progress to dc home with family assist and reports first OP PT scheduled for 10/15/22.     Recommendations for follow up therapy are one component of a multi-disciplinary discharge planning process, led by the attending physician.  Recommendations may be updated based on patient status, additional functional criteria and insurance authorization.  Follow Up Recommendations Follow physician's recommendations for discharge plan and follow up therapies      Assistance Recommended at Discharge Intermittent Supervision/Assistance  Patient can return home with the following  A little help with walking and/or transfers;A little help with bathing/dressing/bathroom;Help with stairs or ramp for entrance;Assist for transportation;Assistance with cooking/housework    Equipment Recommendations None recommended by PT  Recommendations for Other Services       Functional Status Assessment Patient has had a recent decline in their functional status and demonstrates the ability to make significant improvements in function in a reasonable and predictable amount of time.     Precautions / Restrictions Precautions Precautions: Fall;Knee Restrictions Weight Bearing Restrictions: No RLE Weight Bearing: Weight bearing as tolerated      Mobility  Bed Mobility Overal bed mobility: Needs Assistance Bed Mobility: Supine to Sit     Supine to sit: Min guard     General bed mobility comments: Increased time with min guard for safety only    Transfers Overall transfer level: Needs assistance Equipment used: Rolling walker (2 wheels) Transfers: Sit  to/from Stand Sit to Stand: Min assist           General transfer comment: cues for LE management and use of UEs to self assist    Ambulation/Gait Ambulation/Gait assistance: Min assist Gait Distance (Feet): 36 Feet Assistive device: Rolling walker (2 wheels) Gait Pattern/deviations: Step-to pattern, Decreased step length - right, Decreased step length - left, Shuffle, Trunk flexed Gait velocity: decr     General Gait Details: cues for sequence, posture and position from [employer]            Wheelchair Mobility    Modified Rankin (Stroke Patients Only)       Balance Overall balance assessment: Needs assistance Sitting-balance support: No upper extremity supported, Feet supported Sitting balance-Leahy Scale: Good     Standing balance support: Bilateral upper extremity supported Standing balance-Leahy Scale: Poor                               Pertinent Vitals/Pain Pain Assessment Pain Assessment: 0-10 Pain Score: 6  Pain Location: R knee Pain Descriptors / Indicators: Aching, Sore Pain Intervention(s): Limited activity within patient's tolerance, Monitored during session, Premedicated before session, Ice applied    Home Living Family/patient expects to be discharged to:: Private residence Living Arrangements: Spouse/significant other Available Help at Discharge: Available 24 hours/day Type of Home: House Home Access: Stairs to enter Entrance Stairs-Rails: Right Entrance Stairs-Number of Steps: 2   Home Layout: One level Home Equipment: Conservation officer, nature (2 wheels);Rollator (4 wheels);Cane - single point;Toilet riser;BSC/3in1      Prior Function Prior Level of Function : Independent/Modified Independent  Mobility Comments: using cane as needed       Hand Dominance        Extremity/Trunk Assessment   Upper Extremity Assessment Upper Extremity Assessment: Overall WFL for tasks assessed    Lower Extremity  Assessment Lower Extremity Assessment: RLE deficits/detail    Cervical / Trunk Assessment Cervical / Trunk Assessment: Normal  Communication   Communication: No difficulties  Cognition Arousal/Alertness: Awake/alert Behavior During Therapy: WFL for tasks assessed/performed Overall Cognitive Status: Within Functional Limits for tasks assessed                                          General Comments      Exercises Total Joint Exercises Ankle Circles/Pumps: AROM, Both, 10 reps, Supine   Assessment/Plan    PT Assessment Patient needs continued PT services  PT Problem List Decreased strength;Decreased range of motion;Decreased activity tolerance;Decreased balance;Decreased mobility;Decreased knowledge of use of DME;Pain;Obesity       PT Treatment Interventions DME instruction;Gait training;Stair training;Functional mobility training;Therapeutic activities;Therapeutic exercise;Patient/family education    PT Goals (Current goals can be found in the Care Plan section)  Acute Rehab PT Goals Patient Stated Goal: Regain IND PT Goal Formulation: With patient Time For Goal Achievement: 10/18/22 Potential to Achieve Goals: Good    Frequency 7X/week     Co-evaluation               AM-PAC PT "6 Clicks" Mobility  Outcome Measure Help needed turning from your back to your side while in a flat bed without using bedrails?: A Little Help needed moving from lying on your back to sitting on the side of a flat bed without using bedrails?: A Little Help needed moving to and from a bed to a chair (including a wheelchair)?: A Little Help needed standing up from a chair using your arms (e.g., wheelchair or bedside chair)?: A Little Help needed to walk in hospital room?: A Little Help needed climbing 3-5 steps with a railing? : A Lot 6 Click Score: 17    End of Session Equipment Utilized During Treatment: Gait belt Activity Tolerance: Patient tolerated treatment  well Patient left: in chair;with call bell/phone within reach;with chair alarm set;with family/visitor present Nurse Communication: Mobility status PT Visit Diagnosis: Unsteadiness on feet (R26.81);Difficulty in walking, not elsewhere classified (R26.2);Pain Pain - Right/Left: Right Pain - part of body: Knee    Time: 3790-2409 PT Time Calculation (min) (ACUTE ONLY): 25 min   Charges:   PT Evaluation $PT Eval Low Complexity: 1 Low PT Treatments $Gait Training: 8-22 mins        Debe Coder PT Acute Rehabilitation Services Pager [phone] Office [phone]   [facility] 10/11/2022, 5:38 PM

## 2022-10-11 NOTE — Transfer of Care (Signed)
Immediate Anesthesia Transfer of Care Note  Patient: Courtney Steele  Procedure(s) Performed: COMPUTER ASSISTED TOTAL KNEE ARTHROPLASTY (Right: Knee)  Patient Location: PACU  Anesthesia Type:MAC combined with regional for post-op pain  Level of Consciousness: awake and patient cooperative  Airway & Oxygen Therapy: Patient Spontanous Breathing and Patient connected to face mask oxygen  Post-op Assessment: Report given to RN and Post -op Vital signs reviewed and stable  Post vital signs: stable  Last Vitals:  Vitals Value Taken Time  BP 116/45 10/11/22 1322  Temp    Pulse 69 10/11/22 1323  Resp 10 10/11/22 1323  SpO2 98 % 10/11/22 1323  Vitals shown include unvalidated device data.  Last Pain:  Vitals:   10/11/22 0824  TempSrc: Oral  PainSc: 0-No pain         Complications: No notable events documented.

## 2022-10-11 NOTE — Anesthesia Procedure Notes (Signed)
Anesthesia Regional Block: Adductor canal block   Pre-Anesthetic Checklist: , timeout performed,  Correct Patient, Correct Site, Correct Laterality,  Correct Procedure, Correct Position, site marked,  Risks and benefits discussed,  Surgical consent,  Pre-op evaluation,  At surgeon's request and post-op pain management  Laterality: Right  Prep: chloraprep       Needles:  Injection technique: Single-shot  Needle Type: Echogenic Needle     Needle Length: 9cm  Needle Gauge: 21     Additional Needles:   Procedures:,,,, ultrasound used (permanent image in chart),,    Narrative:  Start time: 10/11/2022 9:06 AM End time: 10/11/2022 9:12 AM Injection made incrementally with aspirations every 5 mL.  Performed by: Personally  Anesthesiologist: Santa Lighter, MD  Additional Notes: No pain on injection. No increased resistance to injection. Injection made in 5cc increments.  Good needle visualization.  Patient tolerated procedure well.

## 2022-10-11 NOTE — Discharge Instructions (Signed)
 Dr. Ceria Suminski Total Joint Specialist [facility] [address] [place], [place] [address] [phone]  TOTAL KNEE REPLACEMENT POSTOPERATIVE DIRECTIONS    Knee Rehabilitation, Guidelines Following Surgery  Results after knee surgery are often greatly improved when you follow the exercise, range of motion and muscle strengthening exercises prescribed by your doctor. Safety measures are also important to protect the knee from further injury. Any time any of these exercises cause you to have increased pain or swelling in your knee joint, decrease the amount until you are comfortable again and slowly increase them. If you have problems or questions, call your caregiver or physical therapist for advice.   WEIGHT BEARING Weight bearing as tolerated with assist device (walker, cane, etc) as directed, use it as long as suggested by your surgeon or therapist, typically at least 4-6 weeks.  HOME CARE INSTRUCTIONS  Remove items at home which could result in a fall. This includes throw rugs or furniture in walking pathways.  Continue medications as instructed at time of discharge. You may have some home medications which will be placed on hold until you complete the course of blood thinner medication.  You may start showering once you are discharged home but do not submerge the incision under water. Just pat the incision dry and apply a dry gauze dressing on daily. Walk with walker as instructed.  You may resume a sexual relationship in one month or when given the OK by your doctor.  Use walker as long as suggested by your caregivers. Avoid periods of inactivity such as sitting longer than an hour when not asleep. This helps prevent blood clots.  You may put full weight on your legs and walk as much as is comfortable.  You may return to work once you are cleared by your doctor.  Do not drive a car for 6 weeks or until released by you surgeon.  Do not drive while  taking narcotics.  Wear the elastic stockings for three weeks following surgery during the day but you may remove then at night. Make sure you keep all of your appointments after your operation with all of your doctors and caregivers. You should call the office at the above phone number and make an appointment for approximately two weeks after the date of your surgery. Do not remove your surgical dressing. The dressing is waterproof; you may take showers in 3 days, but do not take tub baths or submerge the dressing. Please pick up a stool softener and laxative for home use as long as you are requiring pain medications. ICE to the affected knee every three hours for 30 minutes at a time and then as needed for pain and swelling.  Continue to use ice on the knee for pain and swelling from surgery. You may notice swelling that will progress down to the foot and ankle.  This is normal after surgery.  Elevate the leg when you are not up walking on it.   It is important for you to complete the blood thinner medication as prescribed by your doctor. Continue to use the breathing machine which will help keep your temperature down.  It is common for your temperature to cycle up and down following surgery, especially at night when you are not up moving around and exerting yourself.  The breathing machine keeps your lungs expanded and your temperature down.  RANGE OF MOTION AND STRENGTHENING EXERCISES  Rehabilitation of the knee is important following a knee injury or an   operation. After just a few days of immobilization, the muscles of the thigh which control the knee become weakened and shrink (atrophy). Knee exercises are designed to build up the tone and strength of the thigh muscles and to improve knee motion. Often times heat used for twenty to thirty minutes before working out will loosen up your tissues and help with improving the range of motion but do not use heat for the first two weeks following surgery.  These exercises can be done on a training (exercise) mat, on the floor, on a table or on a bed. Use what ever works the best and is most comfortable for you Knee exercises include:  Leg Lifts - While your knee is still immobilized in a splint or cast, you can do straight leg raises. Lift the leg to 60 degrees, hold for 3 sec, and slowly lower the leg. Repeat 10-20 times 2-3 times daily. Perform this exercise against resistance later as your knee gets better.  Quad and Hamstring Sets - Tighten up the muscle on the front of the thigh (Quad) and hold for 5-10 sec. Repeat this 10-20 times hourly. Hamstring sets are done by pushing the foot backward against an object and holding for 5-10 sec. Repeat as with quad sets.  A rehabilitation program following serious knee injuries can speed recovery and prevent re-injury in the future due to weakened muscles. Contact your doctor or a physical therapist for more information on knee rehabilitation.   POST-OPERATIVE OPIOID TAPER INSTRUCTIONS: It is important to wean off of your opioid medication as soon as possible. If you do not need pain medication after your surgery it is ok to stop day one. Opioids include: Codeine, Hydrocodone(Norco, Vicodin), Oxycodone(Percocet, oxycontin) and hydromorphone amongst others.  Long term and even short term use of opiods can cause: Increased pain response Dependence Constipation Depression Respiratory depression And more.  Withdrawal symptoms can include Flu like symptoms Nausea, vomiting And more Techniques to manage these symptoms Hydrate well Eat regular healthy meals Stay active Use relaxation techniques(deep breathing, meditating, yoga) Do Not substitute Alcohol to help with tapering If you have been on opioids for less than two weeks and do not have pain than it is ok to stop all together.  Plan to wean off of opioids This plan should start within one week post op of your joint replacement. Maintain the same  interval or time between taking each dose and first decrease the dose.  Cut the total daily intake of opioids by one tablet each day Next start to increase the time between doses. The last dose that should be eliminated is the evening dose.    SKILLED REHAB INSTRUCTIONS: If the patient is transferred to a skilled rehab facility following release from the hospital, a list of the current medications will be sent to the facility for the patient to continue.  When discharged from the skilled rehab facility, please have the facility set up the patient's Home Health Physical Therapy prior to being released. Also, the skilled facility will be responsible for providing the patient with their medications at time of release from the facility to include their pain medication, the muscle relaxants, and their blood thinner medication. If the patient is still at the rehab facility at time of the two week follow up appointment, the skilled rehab facility will also need to assist the patient in arranging follow up appointment in our office and any transportation needs.  MAKE SURE YOU:  Understand these instructions.  Will watch   your condition.  Will get help right away if you are not doing well or get worse.    Pick up stool softner and laxative for home use following surgery while on pain medications. Do NOT remove your dressing. You may shower.  Do not take tub baths or submerge incision under water. May shower starting three days after surgery. Please use a clean towel to pat the incision dry following showers. Continue to use ice for pain and swelling after surgery. Do not use any lotions or creams on the incision until instructed by your surgeon.  

## 2022-10-11 NOTE — Op Note (Signed)
OPERATIVE REPORT  SURGEON: Rod Can, MD   ASSISTANT: Larene Pickett, PA-C  PREOPERATIVE DIAGNOSIS: Primary Right knee arthritis.   POSTOPERATIVE DIAGNOSIS: Primary Right knee arthritis.   PROCEDURE: Computer assisted Right total knee arthroplasty.   IMPLANTS: Zimmer Persona Tivanium Cemented CR femur, size 7. Persona 0 degree Spiked Keel OsseoTi Tibia, size D. Vivacit-E polyethelyene insert, size 12 mm, MC. TM standard patella, size 35 mm.  ANESTHESIA:  MAC, Regional, and Spinal  TOURNIQUET TIME: Not utilized.   ESTIMATED BLOOD LOSS:-100 mL    ANTIBIOTICS: 2g Ancef.  DRAINS: None.  COMPLICATIONS: None   CONDITION: PACU - hemodynamically stable.   BRIEF CLINICAL NOTE: Courtney Steele is a 71 y.o. female with a long-standing history of Right knee arthritis. After failing conservative management, the patient was indicated for total knee arthroplasty. The risks, benefits, and alternatives to the procedure were explained, and the patient elected to proceed.  PROCEDURE IN DETAIL: Adductor canal block was obtained in the pre-op holding area. Once inside the operative room, spinal anesthesia was obtained, and a foley catheter was inserted. The patient was then positioned and the lower extremity was prepped and draped in the normal sterile surgical fashion.  A time-out was called verifying side and site of surgery. The patient received IV antibiotics within 60 minutes of beginning the procedure. A tourniquet was not utilized.   An anterior approach to the knee was performed utilizing a midvastus arthrotomy. A medial release was performed and the patellar fat pad was excised. Stryker imageless navigation was used to cut the distal femur perpendicular to the mechanical axis. A freehand patellar resection was performed, and the patella was sized an prepared with 3 lug holes.  Nagivation was used to make a neutral proximal tibia resection, taking 3 mm of bone from the less affected medial  side with 3 degrees of slope. The menisci were excised. A spacer block was placed, and the alignment and balance in extension were confirmed.   The distal femur was sized using the 3-degree external rotation guide referencing the posterior femoral cortex. The appropriate 4-in-1 cutting block was pinned into place. Rotation was checked using Whiteside's line, the epicondylar axis, and then confirmed with a spacer block in flexion. The remaining femoral cuts were performed, taking care to protect the MCL.  The tibia was sized and the trial tray was pinned into place. The remaining trail components were inserted. The knee was stable to varus and valgus stress through a full range of motion. The patella tracked centrally, and the PCL was well balanced. The trial components were removed, and the proximal tibial surface was prepared. Final components were impacted into place. The knee was tested for a final time and found to be well balanced.   The wound was copiously irrigated with Irrisept solution and normal saline using pule lavage.  Marcaine solution was injected into the periarticular soft tissue.  The wound was closed in layers using #1 Vicryl and Stratafix for the fascia, 2-0 Vicryl for the subcutaneous fat, 2-0 Monocryl for the deep dermal layer, 3-0 running Monocryl subcuticular Stitch, and 4-0 Monocryl stay sutures at both ends of the wound. Dermabond was applied to the skin.  Once the glue was fully dried, an Aquacell Ag and compressive dressing were applied.  The patient was transported to the recovery room in stable condition.  Sponge, needle, and instrument counts were correct at the end of the case x2.  The patient tolerated the procedure well and there were no known complications.  Please note that a surgical assistant was a medical necessity for this procedure in order to perform it in a safe and expeditious manner. Surgical assistant was necessary to retract the ligaments and vital neurovascular  structures to prevent injury to them and also necessary for proper positioning of the limb to allow for anatomic placement of the prosthesis.

## 2022-10-11 NOTE — Interval H&P Note (Signed)
History and Physical Interval Note:  10/11/2022 9:18 AM  Courtney Steele  has presented today for surgery, with the diagnosis of Right knee osteoarthritis.  The various methods of treatment have been discussed with the patient and family. After consideration of risks, benefits and other options for treatment, the patient has consented to  Procedure(s) with comments: [place] (Right) - 150 as a surgical intervention.  The patient's history has been reviewed, patient examined, no change in status, stable for surgery.  I have reviewed the patient's chart and labs.  Questions were answered to the patient's satisfaction.     Hilton Cork Justus Droke

## 2022-10-12 ENCOUNTER — Encounter (HOSPITAL_COMMUNITY): Payer: Self-pay | Admitting: Orthopedic Surgery

## 2022-10-12 DIAGNOSIS — M1711 Unilateral primary osteoarthritis, right knee: Secondary | ICD-10-CM | POA: Diagnosis not present

## 2022-10-12 DIAGNOSIS — I10 Essential (primary) hypertension: Secondary | ICD-10-CM | POA: Diagnosis not present

## 2022-10-12 DIAGNOSIS — Z955 Presence of coronary angioplasty implant and graft: Secondary | ICD-10-CM | POA: Diagnosis not present

## 2022-10-12 DIAGNOSIS — I2510 Atherosclerotic heart disease of native coronary artery without angina pectoris: Secondary | ICD-10-CM | POA: Diagnosis not present

## 2022-10-12 DIAGNOSIS — F32A Depression, unspecified: Secondary | ICD-10-CM | POA: Diagnosis not present

## 2022-10-12 DIAGNOSIS — E119 Type 2 diabetes mellitus without complications: Secondary | ICD-10-CM | POA: Diagnosis not present

## 2022-10-12 LAB — BASIC METABOLIC PANEL
Anion gap: 9 (ref 5–15)
BUN: 16 mg/dL (ref 8–23)
CO2: 22 mmol/L (ref 22–32)
Calcium: 7.9 mg/dL — ABNORMAL LOW (ref 8.9–10.3)
Chloride: 104 mmol/L (ref 98–111)
Creatinine, Ser: 0.77 mg/dL (ref 0.44–1.00)
GFR, Estimated: >60 mL/min (ref >60)
Glucose, Bld: 141 mg/dL — ABNORMAL HIGH (ref 70–99)
Potassium: 4 mmol/L (ref 3.5–5.1)
Sodium: 135 mmol/L (ref 135–145)

## 2022-10-12 LAB — CBC
HCT: 30.6 % — ABNORMAL LOW (ref 36.0–46.0)
Hemoglobin: 10 g/dL — ABNORMAL LOW (ref 12.0–15.0)
MCH: 31 pg (ref 26.0–34.0)
MCHC: 32.7 g/dL (ref 30.0–36.0)
MCV: 94.7 fL (ref 80.0–100.0)
Platelets: 157 10*3/uL (ref 150–400)
RBC: 3.23 MIL/uL — ABNORMAL LOW (ref 3.87–5.11)
RDW: 13.7 % (ref 11.5–15.5)
WBC: 9.2 10*3/uL (ref 4.0–10.5)
nRBC: 0 % (ref 0.0–0.2)

## 2022-10-12 MED ORDER — ASPIRIN 81 MG PO CHEW: 81.0000 mg | CHEWABLE_TABLET | Freq: Two times a day (BID) | ORAL | 0 refills | Status: AC

## 2022-10-12 MED ORDER — ONDANSETRON HCL 4 MG PO TABS: 4.0000 mg | ORAL_TABLET | Freq: Three times a day (TID) | ORAL | 0 refills | Status: AC | PRN

## 2022-10-12 MED ORDER — SENNA 8.6 MG PO TABS: 2.0000 | ORAL_TABLET | Freq: Every day | ORAL | 0 refills | Status: AC

## 2022-10-12 MED ORDER — POLYETHYLENE GLYCOL 3350 17 G PO PACK: 17.0000 g | PACK | Freq: Every day | ORAL | 0 refills | Status: AC | PRN

## 2022-10-12 MED ORDER — DOCUSATE SODIUM 100 MG PO CAPS: 100.0000 mg | ORAL_CAPSULE | Freq: Two times a day (BID) | ORAL | 0 refills | Status: AC

## 2022-10-12 MED ORDER — OXYCODONE HCL 5 MG PO TABS: 5.0000 mg | ORAL_TABLET | ORAL | 0 refills | Status: DC | PRN

## 2022-10-12 NOTE — Progress Notes (Signed)
Physical Therapy Treatment Patient Details Name: Courtney Steele MRN: [id number] DOB: Dec 12, 1950 Today's Date: 10/12/2022   History of Present Illness Pt s/p R TKR and with hx of DJD, CAD, DM, obesity and Fibromyalgia    PT Comments    Pt very cooperative and progressing well with mobility.  Pt up to bathroom for toileting and hygiene at sink and then to ambulate increased distance in hall and negotiate stairs.  Pt requesting rest break prior to attempting therex 2* increasing pain level.   Recommendations for follow up therapy are one component of a multi-disciplinary discharge planning process, led by the attending physician.  Recommendations may be updated based on patient status, additional functional criteria and insurance authorization.  Follow Up Recommendations  Follow physician's recommendations for discharge plan and follow up therapies     Assistance Recommended at Discharge Intermittent Supervision/Assistance  Patient can return home with the following A little help with walking and/or transfers;A little help with bathing/dressing/bathroom;Help with stairs or ramp for entrance;Assist for transportation;Assistance with cooking/housework   Equipment Recommendations  None recommended by PT    Recommendations for Other Services       Precautions / Restrictions Precautions Precautions: Fall;Knee Restrictions Weight Bearing Restrictions: No RLE Weight Bearing: Weight bearing as tolerated     Mobility  Bed Mobility               General bed mobility comments: Pt up in chair and requests back to same    Transfers Overall transfer level: Needs assistance Equipment used: Rolling walker (2 wheels) Transfers: Sit to/from Stand Sit to Stand: Min guard, Supervision           General transfer comment: cues for LE management and use of UEs to self assist    Ambulation/Gait Ambulation/Gait assistance: Min guard Gait Distance (Feet): 84 Feet (and 15' into  bathroom) Assistive device: Rolling walker (2 wheels) Gait Pattern/deviations: Step-to pattern, Decreased step length - right, Decreased step length - left, Shuffle, Trunk flexed Gait velocity: decr     General Gait Details: cues for sequence, posture and position from RW   Stairs Stairs: Yes Stairs assistance: Min assist Stair Management: One rail Left, Step to pattern, Forwards, With cane, With crutches Number of Stairs: 5 General stair comments: cues for sequence; up 2 step with cane and rail; down 3 step with crutch and rail   Wheelchair Mobility    Modified Rankin (Stroke Patients Only)       Balance Overall balance assessment: Needs assistance Sitting-balance support: No upper extremity supported, Feet supported Sitting balance-Leahy Scale: Good     Standing balance support: No upper extremity supported Standing balance-Leahy Scale: Fair                              Cognition Arousal/Alertness: Awake/alert Behavior During Therapy: WFL for tasks assessed/performed Overall Cognitive Status: Within Functional Limits for tasks assessed                                          Exercises      General Comments        Pertinent Vitals/Pain Pain Assessment Pain Assessment: 0-10 Pain Score: 8  Pain Location: R knee Pain Descriptors / Indicators: Aching, Sore Pain Intervention(s): Limited activity within patient's tolerance, Monitored during session, Premedicated before session, Ice applied  Home Living                          Prior Function            PT Goals (current goals can now be found in the care plan section) Acute Rehab PT Goals Patient Stated Goal: Regain IND PT Goal Formulation: With patient Time For Goal Achievement: 10/18/22 Potential to Achieve Goals: Good Progress towards PT goals: Progressing toward goals    Frequency    7X/week      PT Plan Current plan remains appropriate     Co-evaluation              AM-PAC PT "6 Clicks" Mobility   Outcome Measure  Help needed turning from your back to your side while in a flat bed without using bedrails?: A Little Help needed moving from lying on your back to sitting on the side of a flat bed without using bedrails?: A Little Help needed moving to and from a bed to a chair (including a wheelchair)?: A Little Help needed standing up from a chair using your arms (e.g., wheelchair or bedside chair)?: A Little Help needed to walk in hospital room?: A Little Help needed climbing 3-5 steps with a railing? : A Little 6 Click Score: 18    End of Session Equipment Utilized During Treatment: Gait belt Activity Tolerance: Patient tolerated treatment well Patient left: in chair;with call bell/phone within reach;with chair alarm set;with family/visitor present Nurse Communication: Mobility status PT Visit Diagnosis: Unsteadiness on feet (R26.81);Difficulty in walking, not elsewhere classified (R26.2);Pain Pain - Right/Left: Right Pain - part of body: Knee     Time: 6767-2094 PT Time Calculation (min) (ACUTE ONLY): 28 min  Charges:  $Gait Training: 8-22 mins $Therapeutic Activity: 8-22 mins                     Debe Coder PT Acute Rehabilitation Services Pager [phone] Office [phone]    Gervis Gaba 10/12/2022, 11:39 AM

## 2022-10-12 NOTE — Care Plan (Signed)
Ortho Bundle Case Management Note  Patient Details  Name: Courtney Steele MRN: [id number] Date of Birth: 05-01-1951  R TKA on 10-11-22 DCP:  Home with husband DME:  No needs, has a RW PT:  Quinn Plowman on 10-15-22                   DME Arranged:  N/A DME Agency:  NA  HH Arranged:  NA [place] Agency:  NA  Additional Comments: Please contact me with any questions of if this plan should need to change.  Marianne Sofia, RN,CCM [facility]  [phone] 10/12/2022, 8:02 AM

## 2022-10-12 NOTE — Progress Notes (Signed)
The patient is alert and oriented and has been seen by her physician. The orders for discharge were written. IV has been removed. Went over discharge instructions with patient and family. She is being discharged via wheelchair with all of her belongings.  

## 2022-10-12 NOTE — Progress Notes (Signed)
    Subjective:  Patient reports pain as mild to moderate.  Denies N/V/CP/SOB/Abd pain. She states that her pain is a 4 this morning. She denies any tingling or numbness in LE bilaterally.   Objective:   VITALS:   Vitals:   10/11/22 1630 10/11/22 1643 10/12/22 0026 10/12/22 0542  BP:  109/60 123/73 113/67  Pulse:  87 78 71  Resp:  '18 18 16  '$ Temp: 97.9 F (36.6 C) 97.8 F (36.6 C) 98.6 F (37 C) 98.1 F (36.7 C)  TempSrc:  Oral Oral Oral  SpO2:  95% 92% 91%  Weight:      Height:        Patient is lying comfortably in bed. NAD. Neurologically intact ABD soft Neurovascular intact Sensation intact distally Intact pulses distally Dorsiflexion/Plantar flexion intact Incision: dressing C/D/I No cellulitis present Compartment soft Ice on knee this morning.   Lab Results  Component Value Date   WBC 9.2 10/12/2022   HGB 10.0 (L) 10/12/2022   HCT 30.6 (L) 10/12/2022   MCV 94.7 10/12/2022   PLT 157 10/12/2022   BMET    Component Value Date/Time   NA 135 10/12/2022 0331   K 4.0 10/12/2022 0331   CL 104 10/12/2022 0331   CO2 22 10/12/2022 0331   GLUCOSE 141 (H) 10/12/2022 0331   BUN 16 10/12/2022 0331   CREATININE 0.77 10/12/2022 0331   CALCIUM 7.9 (L) 10/12/2022 0331   GFRNONAA >60 10/12/2022 0331     Assessment/Plan: 1 Day Post-Op   Principal Problem:   Osteoarthritis of right knee   WBAT with walker DVT ppx: Aspirin, SCDs, TEDS PO pain control PT/OT: She ambulated 36 feet with PT yesterday. Continue PT today.  Dispo: D/c home with OPPT once cleared with PT.    Charlott Rakes, PA-C 10/12/2022, 8:39 AM   [facility]  [place] [address], [address], [place], [place] [address] Phone: [phone] [email or web address] Facebook  [employer]

## 2022-10-12 NOTE — TOC Transition Note (Signed)
Transition of Care [facility]) - CM/SW Discharge Note   Patient Details  Name: Courtney Steele MRN: [id number] Date of Birth: 1950/12/12  [facility] CM/SW Contact:  Lennart Pall, LCSW Phone Number: 10/12/2022, 12:52 PM   Clinical Narrative:     Met with pt and confirming she has needed DME at home.  OPPT arranged with [facility].  No TOC needs.  Final next level of care: OP Rehab Barriers to Discharge: No Barriers Identified   Patient Goals and CMS Choice Patient states their goals for this hospitalization and ongoing recovery are:: return home      Discharge Placement                       Discharge Plan and Services                DME Arranged: N/A DME Agency: NA       HH Arranged: NA HH Agency: NA        Social Determinants of Health (SDOH) Interventions     Readmission Risk Interventions     No data to display

## 2022-10-12 NOTE — Progress Notes (Signed)
Physical Therapy Treatment Patient Details Name: Courtney Steele MRN: [id number] DOB: December 20, 1950 Today's Date: 10/12/2022   History of Present Illness Pt s/p R TKR and with hx of DJD, CAD, DM, obesity and Fibromyalgia    PT Comments    Pt performed therex program with assist.  Written instruction provided and reviewed.  Pt eager for dc home this date.  Recommendations for follow up therapy are one component of a multi-disciplinary discharge planning process, led by the attending physician.  Recommendations may be updated based on patient status, additional functional criteria and insurance authorization.  Follow Up Recommendations  Follow physician's recommendations for discharge plan and follow up therapies     Assistance Recommended at Discharge Intermittent Supervision/Assistance  Patient can return home with the following A little help with walking and/or transfers;A little help with bathing/dressing/bathroom;Help with stairs or ramp for entrance;Assist for transportation;Assistance with cooking/housework   Equipment Recommendations  None recommended by PT    Recommendations for Other Services       Precautions / Restrictions Precautions Precautions: Fall;Knee Restrictions Weight Bearing Restrictions: No RLE Weight Bearing: Weight bearing as tolerated     Mobility  Bed Mobility               General bed mobility comments: Pt up in chair and requests back to same    Transfers Overall transfer level: Needs assistance Equipment used: Rolling walker (2 wheels) Transfers: Sit to/from Stand Sit to Stand: Min guard, Supervision           General transfer comment: cues for LE management and use of UEs to self assist    Ambulation/Gait Ambulation/Gait assistance: Min guard Gait Distance (Feet): 84 Feet (and 15' into bathroom) Assistive device: Rolling walker (2 wheels) Gait Pattern/deviations: Step-to pattern, Decreased step length - right, Decreased step  length - left, Shuffle, Trunk flexed Gait velocity: decr     General Gait Details: cues for sequence, posture and position from RW   Stairs Stairs: Yes Stairs assistance: Min assist Stair Management: One rail Left, Step to pattern, Forwards, With cane, With crutches Number of Stairs: 5 General stair comments: cues for sequence; up 2 step with cane and rail; down 3 step with crutch and rail   Wheelchair Mobility    Modified Rankin (Stroke Patients Only)       Balance Overall balance assessment: Needs assistance Sitting-balance support: No upper extremity supported, Feet supported Sitting balance-Leahy Scale: Good     Standing balance support: No upper extremity supported Standing balance-Leahy Scale: Fair                              Cognition Arousal/Alertness: Awake/alert Behavior During Therapy: WFL for tasks assessed/performed Overall Cognitive Status: Within Functional Limits for tasks assessed                                          Exercises Total Joint Exercises Ankle Circles/Pumps: AROM, Both, Supine, 15 reps Quad Sets: AROM, Both, 10 reps, Supine Heel Slides: AAROM, Right, 10 reps, Supine Hip ABduction/ADduction: AAROM, Right, 10 reps, Supine Straight Leg Raises: AAROM, Right, 10 reps, Supine    General Comments        Pertinent Vitals/Pain Pain Assessment Pain Assessment: 0-10 Pain Score: 7  Pain Location: R knee Pain Descriptors / Indicators: Aching, Sore Pain Intervention(s): Limited activity within  patient's tolerance, Monitored during session, Premedicated before session, Ice applied    Home Living                          Prior Function            PT Goals (current goals can now be found in the care plan section) Acute Rehab PT Goals Patient Stated Goal: Regain IND PT Goal Formulation: With patient Time For Goal Achievement: 10/18/22 Potential to Achieve Goals: Good Progress towards PT  goals: Progressing toward goals    Frequency    7X/week      PT Plan Current plan remains appropriate    Co-evaluation              AM-PAC PT "6 Clicks" Mobility   Outcome Measure  Help needed turning from your back to your side while in a flat bed without using bedrails?: A Little Help needed moving from lying on your back to sitting on the side of a flat bed without using bedrails?: A Little Help needed moving to and from a bed to a chair (including a wheelchair)?: A Little Help needed standing up from a chair using your arms (e.g., wheelchair or bedside chair)?: A Little Help needed to walk in hospital room?: A Little Help needed climbing 3-5 steps with a railing? : A Little 6 Click Score: 18    End of Session Equipment Utilized During Treatment: Gait belt Activity Tolerance: Patient tolerated treatment well;Patient limited by pain Patient left: in chair;with call bell/phone within reach;with chair alarm set;with family/visitor present Nurse Communication: Mobility status PT Visit Diagnosis: Unsteadiness on feet (R26.81);Difficulty in walking, not elsewhere classified (R26.2);Pain Pain - Right/Left: Right Pain - part of body: Knee     Time: 4081-4481 PT Time Calculation (min) (ACUTE ONLY): 22 min  Charges:  $Gait Training: 8-22 mins $Therapeutic Exercise: 8-22 mins $Therapeutic Activity: 8-22 mins                     Debe Coder PT Acute Rehabilitation Services Pager [phone] Office [phone]    Caydan Mctavish 10/12/2022, 12:31 PM

## 2022-10-12 NOTE — Plan of Care (Signed)
  Problem: Education: Goal: Knowledge of General Education information will improve Description Including pain rating scale, medication(s)/side effects and non-pharmacologic comfort measures Outcome: Progressing   

## 2022-10-14 DIAGNOSIS — M25661 Stiffness of right knee, not elsewhere classified: Secondary | ICD-10-CM | POA: Insufficient documentation

## 2022-10-15 ENCOUNTER — Telehealth: Payer: Self-pay

## 2022-10-15 DIAGNOSIS — M25661 Stiffness of right knee, not elsewhere classified: Secondary | ICD-10-CM | POA: Diagnosis not present

## 2022-10-15 DIAGNOSIS — M25561 Pain in right knee: Secondary | ICD-10-CM | POA: Diagnosis not present

## 2022-10-15 NOTE — Telephone Encounter (Signed)
     Patient  visit on 09/22/2022  at [facility] was for Allergy reaction, unspecified, initial encounter.  Have you been able to follow up with your primary care physician? Patient did not need to follow up with her PCP.  The patient was or was not able to obtain any needed medicine or equipment. Patient was able to obtain medications.  Are there diet recommendations that you are having difficulty following? No  Patient expresses understanding of discharge instructions and education provided has no other needs at this time.    [place] Resource Care Guide   ??[email or web address]  ?? [phone]   Website: [email or web address]  [email or web address]

## 2022-10-17 DIAGNOSIS — M25661 Stiffness of right knee, not elsewhere classified: Secondary | ICD-10-CM | POA: Diagnosis not present

## 2022-10-17 DIAGNOSIS — M25561 Pain in right knee: Secondary | ICD-10-CM | POA: Diagnosis not present

## 2022-10-19 DIAGNOSIS — M25661 Stiffness of right knee, not elsewhere classified: Secondary | ICD-10-CM | POA: Diagnosis not present

## 2022-10-22 DIAGNOSIS — M25661 Stiffness of right knee, not elsewhere classified: Secondary | ICD-10-CM | POA: Diagnosis not present

## 2022-10-24 DIAGNOSIS — M25561 Pain in right knee: Secondary | ICD-10-CM | POA: Diagnosis not present

## 2022-10-24 DIAGNOSIS — M25661 Stiffness of right knee, not elsewhere classified: Secondary | ICD-10-CM | POA: Diagnosis not present

## 2022-10-26 DIAGNOSIS — Z96651 Presence of right artificial knee joint: Secondary | ICD-10-CM | POA: Diagnosis not present

## 2022-10-26 DIAGNOSIS — Z471 Aftercare following joint replacement surgery: Secondary | ICD-10-CM | POA: Diagnosis not present

## 2022-10-29 DIAGNOSIS — M25561 Pain in right knee: Secondary | ICD-10-CM | POA: Diagnosis not present

## 2022-10-29 DIAGNOSIS — M25661 Stiffness of right knee, not elsewhere classified: Secondary | ICD-10-CM | POA: Diagnosis not present

## 2022-10-31 DIAGNOSIS — M25661 Stiffness of right knee, not elsewhere classified: Secondary | ICD-10-CM | POA: Diagnosis not present

## 2022-10-31 DIAGNOSIS — M25561 Pain in right knee: Secondary | ICD-10-CM | POA: Diagnosis not present

## 2022-11-05 DIAGNOSIS — M25561 Pain in right knee: Secondary | ICD-10-CM | POA: Diagnosis not present

## 2022-11-05 DIAGNOSIS — M25661 Stiffness of right knee, not elsewhere classified: Secondary | ICD-10-CM | POA: Diagnosis not present

## 2022-11-13 DIAGNOSIS — M47812 Spondylosis without myelopathy or radiculopathy, cervical region: Secondary | ICD-10-CM | POA: Diagnosis not present

## 2022-11-13 DIAGNOSIS — E1142 Type 2 diabetes mellitus with diabetic polyneuropathy: Secondary | ICD-10-CM | POA: Diagnosis not present

## 2022-11-13 DIAGNOSIS — G894 Chronic pain syndrome: Secondary | ICD-10-CM | POA: Diagnosis not present

## 2022-11-13 DIAGNOSIS — M47816 Spondylosis without myelopathy or radiculopathy, lumbar region: Secondary | ICD-10-CM | POA: Diagnosis not present

## 2022-11-27 DIAGNOSIS — Z471 Aftercare following joint replacement surgery: Secondary | ICD-10-CM | POA: Diagnosis not present

## 2022-11-27 DIAGNOSIS — Z96651 Presence of right artificial knee joint: Secondary | ICD-10-CM | POA: Diagnosis not present

## 2022-12-18 DIAGNOSIS — E1142 Type 2 diabetes mellitus with diabetic polyneuropathy: Secondary | ICD-10-CM | POA: Diagnosis not present

## 2022-12-18 DIAGNOSIS — M47812 Spondylosis without myelopathy or radiculopathy, cervical region: Secondary | ICD-10-CM | POA: Diagnosis not present

## 2022-12-18 DIAGNOSIS — G894 Chronic pain syndrome: Secondary | ICD-10-CM | POA: Diagnosis not present

## 2022-12-18 DIAGNOSIS — M47816 Spondylosis without myelopathy or radiculopathy, lumbar region: Secondary | ICD-10-CM | POA: Diagnosis not present

## 2022-12-25 ENCOUNTER — Encounter: Payer: Self-pay | Admitting: Nurse Practitioner

## 2022-12-25 ENCOUNTER — Ambulatory Visit: Payer: Medicare Other | Attending: Nurse Practitioner | Admitting: Nurse Practitioner

## 2022-12-25 VITALS — BP 132/72 | HR 88 | Ht 63.0 in | Wt 218.0 lb

## 2022-12-25 DIAGNOSIS — I10 Essential (primary) hypertension: Secondary | ICD-10-CM

## 2022-12-25 DIAGNOSIS — I2510 Atherosclerotic heart disease of native coronary artery without angina pectoris: Secondary | ICD-10-CM

## 2022-12-25 DIAGNOSIS — E669 Obesity, unspecified: Secondary | ICD-10-CM | POA: Diagnosis not present

## 2022-12-25 DIAGNOSIS — E785 Hyperlipidemia, unspecified: Secondary | ICD-10-CM | POA: Diagnosis not present

## 2022-12-25 NOTE — Patient Instructions (Addendum)
Medication Instructions:  Your physician recommends that you continue on your current medications as directed. Please refer to the Current Medication list given to you today.   *If you need a refill on your cardiac medications before your next appointment, please call your pharmacy*   Lab Work: Your physician recommends that you return for lab work in 1 week. Fasting Lipid panel & LFTs  If you have labs (blood work) drawn today and your tests are completely normal, you will receive your results only by: MyChart Message (if you have MyChart) OR A paper copy in the mail If you have any lab test that is abnormal or we need to change your treatment, we will call you to review the results.   Testing/Procedures: NONE ordered at this time of appointment    Follow-Up: At [facility], you and your health needs are our priority.  As part of our continuing mission to provide you with exceptional heart care, we have created designated Provider Care Teams.  These Care Teams include your primary Cardiologist (physician) and Advanced Practice Providers (APPs -  Physician Assistants and Nurse Practitioners) who all work together to provide you with the care you need, when you need it.  We recommend signing up for the patient portal called "MyChart".  Sign up information is provided on this After Visit Summary.  MyChart is used to connect with patients for Virtual Visits (Telemedicine).  Patients are able to view lab/test results, encounter notes, upcoming appointments, etc.  Non-urgent messages can be sent to your provider as well.   To learn more about what you can do with MyChart, go to [email or web address].    Your next appointment:   1 year(s)  Provider:   Peter Martinique, MD     Other Instructions

## 2022-12-25 NOTE — Progress Notes (Signed)
Office Visit    Patient Name: Courtney Steele Date of Encounter: 12/25/2022  Primary Care Provider:  Kelton Pillar, MD Primary Cardiologist:  Peter Martinique, MD  Chief Complaint    72 year old female with a history of CAD s/p DES-LAD in 2012, hypertension, hyperlipidemia, fibromyalgia and obesity who presents for follow-up related to CAD.  Past Medical History    Past Medical History:  Diagnosis Date   Borderline diabetes    CAD (coronary artery disease)    a. 2012: DES to LAD    COVID    Degenerative joint disease    Depression    Diabetes mellitus    Fibromyalgia    GERD (gastroesophageal reflux disease)    Groin hematoma    Hematoma    Radial catheter site July 13, 2011   Hyperlipidemia    Hypertension    Morbid obesity ([place])    Osteoarthritis    Pneumonia    Past Surgical History:  Procedure Laterality Date   ABDOMINAL HYSTERECTOMY     ANGIOPLASTY     BUNIONECTOMY     CARDIAC CATHETERIZATION     revealing patency of the LAD stent with mild compromise of  the diagonal and moderate mid LAD stenosis, but does not appear to  be high grade.    CARDIAC CATHETERIZATION N/A 11-23-2015   Procedure: Left Heart Cath and Coronary Angiography;  Surgeon: Peter M Martinique, MD;  Location: [facility];  Service: Cardiovascular;  Laterality: N/A;   CARPAL TUNNEL RELEASE     CATARACT EXTRACTION     CHOLECYSTECTOMY     KNEE ARTHROPLASTY Right 10/11/2022   Procedure: COMPUTER ASSISTED TOTAL KNEE ARTHROPLASTY;  Surgeon: Rod Can, MD;  Location: [facility];  Service: Orthopedics;  Laterality: Right;  150   KNEE ARTHROSCOPY     right ulnar nerve transposition      TONSILLECTOMY      Allergies  Allergies  Allergen Reactions   Beef-Derived Products Anaphylaxis   Other Anaphylaxis    No mammal protein substances   Benazepril Cough   Erythromycin Diarrhea   Indomethacin Diarrhea   Lisinopril Cough   Biaxin [Clarithromycin] Rash   Clarithromycin Rash   Penicillins  Rash     Labs/Other Studies Reviewed    The following studies were reviewed today: LHC 11/23/2015:  Prox RCA to Mid RCA lesion, 10% stenosed. Mid LAD lesion, 30% stenosed. The left ventricular systolic function is normal.   1. Nonobstructive CAD. The stent in the proximal LAD is widely patent.  2. Normal LV function.   Plan: continue medical therapy. Consider alternative causes for chest pain.  Lexiscan myoview 01/06/2021:  The left ventricular ejection fraction is hyperdynamic (>65%). Nuclear stress EF: 72%. There was no ST segment deviation noted during stress. No T wave inversion was noted during stress. The study is normal. This is a low risk study.  Recent Labs: 10/12/2022: BUN 16; Creatinine, Ser 0.77; Hemoglobin 10.0; Platelets 157; Potassium 4.0; Sodium 135  Recent Lipid Panel    Component Value Date/Time   CHOL 152 06/19/2021 0917   TRIG 206 (H) 06/19/2021 0917   HDL 42 06/19/2021 0917   CHOLHDL 3.6 06/19/2021 0917   CHOLHDL 3.6 10/29/2015 0347   VLDL 41 (H) 10/29/2015 0347   LDLCALC 76 06/19/2021 0917    History of Present Illness    72 year old female with the above past medical history including CAD s/p DES-LAD in 2012, hypertension, hyperlipidemia, fibromyalgia and obesity.  Most recent cardiac catheterization in 2016 showed  patent LAD stent without significant residual disease.  She has had occasional twinges of chest pain since this time, treated with Ranexa and Imdur.  Lexiscan Myoview in 12/2020 was negative for ischemia.  She was last seen in office on 12/21/2021 and was stable from a cardiac standpoint.  She was participating in water aerobics at the time.  She denied symptoms concerning for angina.  She presents today for follow-up.  Since her last visit she has done well from a cardiac standpoint.  She denies any symptoms concerning for angina.  She had a right knee replacement in 10/2022 and does have some mostly dependent right lower extremity edema.   She denies dyspnea, PND, orthopnea, weight gain.  She is back to doing her water aerobics postsurgery and is tolerating this well.  Overall, she reports feeling well.  Home Medications    Current Outpatient Medications  Medication Sig Dispense Refill   acetaminophen (TYLENOL) 500 MG tablet Take 500 mg by mouth every 6 (six) hours as needed for moderate pain.     albuterol (PROVENTIL HFA;VENTOLIN HFA) 108 (90 BASE) MCG/ACT inhaler Inhale 1-2 puffs into the lungs every 6 (six) hours as needed for wheezing or shortness of breath. 1 Inhaler 0   amLODipine (NORVASC) 2.5 MG tablet TAKE 1 TABLET(2.5 MG) BY MOUTH DAILY 90 tablet 3   atorvastatin (LIPITOR) 40 MG tablet TAKE 1 TABLET(40 MG) BY MOUTH DAILY 90 tablet 3   b complex vitamins capsule Take 1 capsule by mouth daily.     Calcium Carb-Cholecalciferol (CALCIUM + VITAMIN D3 PO) Take 1 tablet by mouth daily.     cholecalciferol (VITAMIN D3) 25 MCG (1000 UNIT) tablet Take 1,000 Units by mouth daily.     cyanocobalamin (VITAMIN B12) 500 MCG tablet Take 500 mcg by mouth daily.     cyclobenzaprine (FLEXERIL) 5 MG tablet Take 5-10 mg by mouth 3 (three) times daily as needed for muscle spasms.     DULoxetine (CYMBALTA) 30 MG capsule Take 90 mg by mouth daily.  1   empagliflozin (JARDIANCE) 10 MG TABS tablet Take 10 mg by mouth daily.     EPINEPHrine 0.3 mg/0.3 mL IJ SOAJ injection Inject 0.3 mg into the muscle as needed for anaphylaxis. 1 each 0   famotidine (PEPCID) 20 MG tablet Take 1 tablet (20 mg total) by mouth daily for 5 days. 5 tablet 0   gabapentin (NEURONTIN) 100 MG capsule Take 200 mg by mouth at bedtime.     icosapent Ethyl (VASCEPA) 1 g capsule TAKE 2 CAPSULES BY MOUTH TWICE DAILY 360 capsule 3   isosorbide mononitrate (IMDUR) 30 MG 24 hr tablet TAKE 1 TABLET(30 MG) BY MOUTH DAILY 90 tablet 3   MELATONIN PO Take 1 tablet by mouth daily.     metoprolol succinate (TOPROL-XL) 25 MG 24 hr tablet TAKE 1 TABLET BY MOUTH DAILY 90 tablet 3    nitroGLYCERIN (NITROSTAT) 0.4 MG SL tablet Place 1 tablet (0.4 mg total) under the tongue every 5 (five) minutes as needed. For chest pain 25 tablet 3   pantoprazole (PROTONIX) 40 MG tablet TAKE 1 TABLET(40 MG) BY MOUTH DAILY 90 tablet 3   ranolazine (RANEXA) 1000 MG SR tablet TAKE 1 TABLET(1000 MG) BY MOUTH TWICE DAILY 180 tablet 3   TURMERIC PO Take 1 capsule by mouth daily.     diphenhydrAMINE (BENADRYL) 25 MG tablet Take 1 tablet (25 mg total) by mouth every 6 (six) hours for 5 days. (Patient not taking: Reported on 09/28/2022) 20  tablet 0   hydrOXYzine (ATARAX) 25 MG tablet Take 1 tablet (25 mg total) by mouth every 6 (six) hours as needed for itching. (Patient not taking: Reported on 12/25/2022) 12 tablet 0   ondansetron (ZOFRAN) 4 MG tablet Take 1 tablet (4 mg total) by mouth every 8 (eight) hours as needed for nausea or vomiting. (Patient not taking: Reported on 12/25/2022) 30 tablet 0   oxyCODONE (ROXICODONE) 5 MG immediate release tablet Take 1 tablet (5 mg total) by mouth every 4 (four) hours as needed for severe pain. (Patient not taking: Reported on 12/25/2022) 42 tablet 0   triamcinolone cream (KENALOG) 0.1 % Apply 1 Application topically 2 (two) times daily. (Patient not taking: Reported on 09/28/2022) 30 g 0   No current facility-administered medications for this visit.     Review of Systems    She denies chest pain, palpitations, dyspnea, pnd, orthopnea, n, v, dizziness, syncope, weight gain, or early satiety. All other systems reviewed and are otherwise negative except as noted above.   Physical Exam    VS:  BP 132/72   Pulse 88   Ht '5\' 3"'$  (1.6 m)   Wt 218 lb (98.9 kg)   SpO2 98%   BMI 38.62 kg/m  GEN: Well nourished, well developed, in no acute distress. HEENT: normal. Neck: Supple, no JVD, carotid bruits, or masses. Cardiac: RRR, no murmurs, rubs, or gallops. No clubbing, cyanosis, nonpitting RLE edema.  Radials/DP/PT 2+ and equal bilaterally.  Respiratory:   Respirations regular and unlabored, clear to auscultation bilaterally. GI: Soft, nontender, nondistended, BS + x 4. MS: no deformity or atrophy. Skin: warm and dry, no rash. Neuro:  Strength and sensation are intact. Psych: Normal affect.  Accessory Clinical Findings    ECG personally reviewed by me today -NSR, 88 bpm, incomplete RBBB- no acute changes.   Lab Results  Component Value Date   WBC 9.2 10/12/2022   HGB 10.0 (L) 10/12/2022   HCT 30.6 (L) 10/12/2022   MCV 94.7 10/12/2022   PLT 157 10/12/2022   Lab Results  Component Value Date   CREATININE 0.77 10/12/2022   BUN 16 10/12/2022   NA 135 10/12/2022   K 4.0 10/12/2022   CL 104 10/12/2022   CO2 22 10/12/2022   Lab Results  Component Value Date   ALT 17 11/26/2020   AST 17 11/26/2020   ALKPHOS 43 11/26/2020   BILITOT 1.2 11/26/2020   Lab Results  Component Value Date   CHOL 152 06/19/2021   HDL 42 06/19/2021   LDLCALC 76 06/19/2021   TRIG 206 (H) 06/19/2021   CHOLHDL 3.6 06/19/2021    Lab Results  Component Value Date   HGBA1C 6.0 (H) 10/02/2022    Assessment & Plan    1. CAD: S/p DES-LAD in 2012.  Cath in 2016 showed patent stent-LAD without significant residual disease.  Stable with no anginal symptoms.  Continue amlodipine, metoprolol, Imdur, Ranexa, Lipitor and Vascepa.  2. Hypertension: BP well controlled. Continue current antihypertensive regimen.   3. Hyperlipidemia: LDL was 76 in 06/2021.  Will repeat fasting lipids, LFTs.  Continue Lipitor, Vascepa.  4. Obesity: Encouraged ongoing lifestyle modification diet and exercise as tolerated.  5. Disposition: Follow-up in 1 year.     Lenna Sciara, NP 12/25/2022, 11:56 AM

## 2022-12-29 ENCOUNTER — Other Ambulatory Visit: Payer: Self-pay | Admitting: Cardiology

## 2022-12-29 DIAGNOSIS — I10 Essential (primary) hypertension: Secondary | ICD-10-CM

## 2023-01-10 DIAGNOSIS — I2510 Atherosclerotic heart disease of native coronary artery without angina pectoris: Secondary | ICD-10-CM | POA: Diagnosis not present

## 2023-01-10 DIAGNOSIS — I10 Essential (primary) hypertension: Secondary | ICD-10-CM | POA: Diagnosis not present

## 2023-01-10 DIAGNOSIS — E785 Hyperlipidemia, unspecified: Secondary | ICD-10-CM | POA: Diagnosis not present

## 2023-01-10 DIAGNOSIS — E669 Obesity, unspecified: Secondary | ICD-10-CM | POA: Diagnosis not present

## 2023-01-10 LAB — LIPID PANEL
Chol/HDL Ratio: 3.3 ratio (ref 0.0–4.4)
Cholesterol, Total: 152 mg/dL (ref 100–199)
HDL: 46 mg/dL (ref >39)
LDL Chol Calc (NIH): 76 mg/dL (ref 0–99)
Triglycerides: 177 mg/dL — ABNORMAL HIGH (ref 0–149)
VLDL Cholesterol Cal: 30 mg/dL (ref 5–40)

## 2023-01-10 LAB — HEPATIC FUNCTION PANEL
ALT: 12 IU/L (ref 0–32)
AST: 17 IU/L (ref 0–40)
Albumin: 4.3 g/dL (ref 3.8–4.8)
Alkaline Phosphatase: 65 IU/L (ref 44–121)
Bilirubin Total: 0.7 mg/dL (ref 0.0–1.2)
Bilirubin, Direct: 0.19 mg/dL (ref 0.00–0.40)
Total Protein: 6.5 g/dL (ref 6.0–8.5)

## 2023-01-16 ENCOUNTER — Other Ambulatory Visit: Payer: Self-pay

## 2023-01-16 ENCOUNTER — Telehealth: Payer: Self-pay

## 2023-01-16 DIAGNOSIS — I10 Essential (primary) hypertension: Secondary | ICD-10-CM

## 2023-01-16 DIAGNOSIS — Z79899 Other long term (current) drug therapy: Secondary | ICD-10-CM

## 2023-01-16 MED ORDER — ATORVASTATIN CALCIUM 80 MG PO TABS: 80.0000 mg | ORAL_TABLET | Freq: Every day | ORAL | 3 refills | Status: DC

## 2023-01-16 NOTE — Telephone Encounter (Addendum)
Spoke with pt. Pt was notified of lab results and recommendations. Pt will increase atorvastatin 80 mg daily. Pt will repeat lab work in 6-8 weeks, continue her current medication and follow up as planned. New prescription for Atorvastatin sent to pharmacy and lab orders were placed.

## 2023-01-28 ENCOUNTER — Other Ambulatory Visit: Payer: Self-pay

## 2023-01-28 DIAGNOSIS — I2510 Atherosclerotic heart disease of native coronary artery without angina pectoris: Secondary | ICD-10-CM

## 2023-01-28 MED ORDER — RANOLAZINE ER 1000 MG PO TB12: ORAL_TABLET | ORAL | 3 refills | Status: DC

## 2023-02-14 DIAGNOSIS — E1142 Type 2 diabetes mellitus with diabetic polyneuropathy: Secondary | ICD-10-CM | POA: Diagnosis not present

## 2023-02-14 DIAGNOSIS — M47816 Spondylosis without myelopathy or radiculopathy, lumbar region: Secondary | ICD-10-CM | POA: Diagnosis not present

## 2023-02-14 DIAGNOSIS — G894 Chronic pain syndrome: Secondary | ICD-10-CM | POA: Diagnosis not present

## 2023-02-14 DIAGNOSIS — M47812 Spondylosis without myelopathy or radiculopathy, cervical region: Secondary | ICD-10-CM | POA: Diagnosis not present

## 2023-02-18 ENCOUNTER — Other Ambulatory Visit: Payer: Self-pay

## 2023-02-18 MED ORDER — ICOSAPENT ETHYL 1 G PO CAPS: ORAL_CAPSULE | ORAL | 2 refills | Status: DC

## 2023-02-25 ENCOUNTER — Other Ambulatory Visit: Payer: Self-pay

## 2023-02-25 ENCOUNTER — Telehealth: Payer: Self-pay | Admitting: Cardiology

## 2023-02-25 DIAGNOSIS — Z79899 Other long term (current) drug therapy: Secondary | ICD-10-CM

## 2023-02-25 DIAGNOSIS — I10 Essential (primary) hypertension: Secondary | ICD-10-CM

## 2023-02-25 MED ORDER — ATORVASTATIN CALCIUM 40 MG PO TABS: 40.0000 mg | ORAL_TABLET | Freq: Every day | ORAL | 3 refills | Status: AC

## 2023-02-25 MED ORDER — EZETIMIBE 10 MG PO TABS: 10.0000 mg | ORAL_TABLET | Freq: Every day | ORAL | 3 refills | Status: DC

## 2023-02-25 NOTE — Telephone Encounter (Signed)
Pt returned call. Pt is aware that it's ok to reduce down to Atorvastatin 40 mg and start Zetia 10 mg. Pt will repeat labs in 6-8 weeks as directed. Medications sent to [facility] and lab orders placed.

## 2023-02-25 NOTE — Telephone Encounter (Signed)
Spoke to patient who reports generalized muscle pain since increasing atorvastatin to 80 in February.  She reports doing ok without issues on 40 mg.    Advised would send message to Kahi Mohala NP to review for recommendations.

## 2023-02-25 NOTE — Telephone Encounter (Signed)
Pt c/o medication issue:  1. Name of Medication: atorvastatin (LIPITOR) 80 MG tablet   2. How are you currently taking this medication (dosage and times per day)?    3. Are you having a reaction (difficulty breathing--STAT)? no  4. What is your medication issue? Dosage went up patient been having a lot of muscle pains and cramps. Please advise

## 2023-02-25 NOTE — Telephone Encounter (Signed)
Attempted to call pt to discuss recommendations per Diona Browner NP. Wasn't able to leave a VM due to pts VM not being set up. Will call pt back.

## 2023-03-20 ENCOUNTER — Telehealth: Payer: Self-pay | Admitting: Cardiology

## 2023-03-20 MED ORDER — OMEGA-3-ACID ETHYL ESTERS 1 G PO CAPS: 4.0000 g | ORAL_CAPSULE | Freq: Every day | ORAL | 11 refills | Status: DC

## 2023-03-20 NOTE — Telephone Encounter (Signed)
Left voicemail for patient to return call to office.  Could switch to Lovaza 4 grams daily   Peter [place] MD, [facility]

## 2023-03-20 NOTE — Telephone Encounter (Signed)
Patient would like to know if you can switch her Vascepa to another medication because her insurance no longer covers Vascepa.

## 2023-03-20 NOTE — Telephone Encounter (Signed)
Pt c/o medication issue:  1. Name of Medication: icosapent Ethyl (VASCEPA) 1 g capsule   2. How are you currently taking this medication (dosage and times per day)? As prescribed   3. Are you having a reaction (difficulty breathing--STAT)? No  4. What is your medication issue? Patient is requesting call back to discuss medication no longer being covered by insurance. Please advise.

## 2023-03-26 NOTE — Telephone Encounter (Signed)
Spoke to patient she stated she already spoke to someone.She will start Lovaza when she finishes Vascepa.

## 2023-03-29 ENCOUNTER — Other Ambulatory Visit: Payer: Self-pay | Admitting: Cardiology

## 2023-03-29 DIAGNOSIS — I10 Essential (primary) hypertension: Secondary | ICD-10-CM

## 2023-04-02 DIAGNOSIS — H35371 Puckering of macula, right eye: Secondary | ICD-10-CM | POA: Diagnosis not present

## 2023-04-11 DIAGNOSIS — M47816 Spondylosis without myelopathy or radiculopathy, lumbar region: Secondary | ICD-10-CM | POA: Diagnosis not present

## 2023-04-11 DIAGNOSIS — G894 Chronic pain syndrome: Secondary | ICD-10-CM | POA: Diagnosis not present

## 2023-04-11 DIAGNOSIS — Z79891 Long term (current) use of opiate analgesic: Secondary | ICD-10-CM | POA: Diagnosis not present

## 2023-04-11 DIAGNOSIS — M47812 Spondylosis without myelopathy or radiculopathy, cervical region: Secondary | ICD-10-CM | POA: Diagnosis not present

## 2023-04-11 DIAGNOSIS — E1142 Type 2 diabetes mellitus with diabetic polyneuropathy: Secondary | ICD-10-CM | POA: Diagnosis not present

## 2023-04-16 DIAGNOSIS — K219 Gastro-esophageal reflux disease without esophagitis: Secondary | ICD-10-CM | POA: Diagnosis not present

## 2023-04-16 DIAGNOSIS — I2510 Atherosclerotic heart disease of native coronary artery without angina pectoris: Secondary | ICD-10-CM | POA: Diagnosis not present

## 2023-04-16 DIAGNOSIS — I119 Hypertensive heart disease without heart failure: Secondary | ICD-10-CM | POA: Diagnosis not present

## 2023-04-16 DIAGNOSIS — Z0000 Encounter for general adult medical examination without abnormal findings: Secondary | ICD-10-CM | POA: Diagnosis not present

## 2023-04-16 DIAGNOSIS — E7800 Pure hypercholesterolemia, unspecified: Secondary | ICD-10-CM | POA: Diagnosis not present

## 2023-04-16 DIAGNOSIS — E1169 Type 2 diabetes mellitus with other specified complication: Secondary | ICD-10-CM | POA: Diagnosis not present

## 2023-04-16 DIAGNOSIS — I700 Atherosclerosis of aorta: Secondary | ICD-10-CM | POA: Diagnosis not present

## 2023-04-16 DIAGNOSIS — T781XXA Other adverse food reactions, not elsewhere classified, initial encounter: Secondary | ICD-10-CM | POA: Diagnosis not present

## 2023-04-16 DIAGNOSIS — F322 Major depressive disorder, single episode, severe without psychotic features: Secondary | ICD-10-CM | POA: Diagnosis not present

## 2023-04-16 DIAGNOSIS — M797 Fibromyalgia: Secondary | ICD-10-CM | POA: Diagnosis not present

## 2023-04-16 DIAGNOSIS — K5730 Diverticulosis of large intestine without perforation or abscess without bleeding: Secondary | ICD-10-CM | POA: Diagnosis not present

## 2023-04-16 DIAGNOSIS — D649 Anemia, unspecified: Secondary | ICD-10-CM | POA: Diagnosis not present

## 2023-05-09 DIAGNOSIS — G894 Chronic pain syndrome: Secondary | ICD-10-CM | POA: Diagnosis not present

## 2023-05-09 DIAGNOSIS — E1142 Type 2 diabetes mellitus with diabetic polyneuropathy: Secondary | ICD-10-CM | POA: Diagnosis not present

## 2023-05-09 DIAGNOSIS — M47812 Spondylosis without myelopathy or radiculopathy, cervical region: Secondary | ICD-10-CM | POA: Diagnosis not present

## 2023-05-09 DIAGNOSIS — M47816 Spondylosis without myelopathy or radiculopathy, lumbar region: Secondary | ICD-10-CM | POA: Diagnosis not present

## 2023-05-16 ENCOUNTER — Other Ambulatory Visit: Payer: Self-pay | Admitting: Cardiology

## 2023-05-28 DIAGNOSIS — Z471 Aftercare following joint replacement surgery: Secondary | ICD-10-CM | POA: Diagnosis not present

## 2023-05-28 DIAGNOSIS — Z96651 Presence of right artificial knee joint: Secondary | ICD-10-CM | POA: Diagnosis not present

## 2023-06-21 ENCOUNTER — Other Ambulatory Visit: Payer: Self-pay | Admitting: Internal Medicine

## 2023-06-21 DIAGNOSIS — Z1231 Encounter for screening mammogram for malignant neoplasm of breast: Secondary | ICD-10-CM

## 2023-06-22 ENCOUNTER — Other Ambulatory Visit: Payer: Self-pay | Admitting: Cardiology

## 2023-06-25 ENCOUNTER — Ambulatory Visit
Admission: RE | Admit: 2023-06-25 | Discharge: 2023-06-25 | Disposition: A | Discharge status: Home / Self Care | Payer: Medicare Other | Source: Ambulatory Visit | Attending: Internal Medicine | Admitting: Internal Medicine

## 2023-06-25 DIAGNOSIS — Z1231 Encounter for screening mammogram for malignant neoplasm of breast: Secondary | ICD-10-CM | POA: Diagnosis not present

## 2023-07-04 DIAGNOSIS — G894 Chronic pain syndrome: Secondary | ICD-10-CM | POA: Diagnosis not present

## 2023-07-04 DIAGNOSIS — E1142 Type 2 diabetes mellitus with diabetic polyneuropathy: Secondary | ICD-10-CM | POA: Diagnosis not present

## 2023-07-04 DIAGNOSIS — M47816 Spondylosis without myelopathy or radiculopathy, lumbar region: Secondary | ICD-10-CM | POA: Diagnosis not present

## 2023-07-04 DIAGNOSIS — M47812 Spondylosis without myelopathy or radiculopathy, cervical region: Secondary | ICD-10-CM | POA: Diagnosis not present

## 2023-08-18 DIAGNOSIS — B029 Zoster without complications: Secondary | ICD-10-CM | POA: Diagnosis not present

## 2023-08-22 DIAGNOSIS — E1142 Type 2 diabetes mellitus with diabetic polyneuropathy: Secondary | ICD-10-CM | POA: Diagnosis not present

## 2023-08-22 DIAGNOSIS — M47816 Spondylosis without myelopathy or radiculopathy, lumbar region: Secondary | ICD-10-CM | POA: Diagnosis not present

## 2023-08-22 DIAGNOSIS — M47812 Spondylosis without myelopathy or radiculopathy, cervical region: Secondary | ICD-10-CM | POA: Diagnosis not present

## 2023-08-22 DIAGNOSIS — G894 Chronic pain syndrome: Secondary | ICD-10-CM | POA: Diagnosis not present

## 2023-08-27 DIAGNOSIS — M65331 Trigger finger, right middle finger: Secondary | ICD-10-CM | POA: Diagnosis not present

## 2023-08-27 DIAGNOSIS — M65332 Trigger finger, left middle finger: Secondary | ICD-10-CM | POA: Diagnosis not present

## 2023-09-18 DIAGNOSIS — M65331 Trigger finger, right middle finger: Secondary | ICD-10-CM | POA: Diagnosis not present

## 2023-09-18 DIAGNOSIS — M65342 Trigger finger, left ring finger: Secondary | ICD-10-CM | POA: Diagnosis not present

## 2023-09-18 DIAGNOSIS — M65332 Trigger finger, left middle finger: Secondary | ICD-10-CM | POA: Diagnosis not present

## 2023-09-18 DIAGNOSIS — M65341 Trigger finger, right ring finger: Secondary | ICD-10-CM | POA: Diagnosis not present

## 2023-09-28 DIAGNOSIS — Z23 Encounter for immunization: Secondary | ICD-10-CM | POA: Diagnosis not present

## 2023-10-14 DIAGNOSIS — R531 Weakness: Secondary | ICD-10-CM | POA: Diagnosis not present

## 2023-10-14 DIAGNOSIS — Z03818 Encounter for observation for suspected exposure to other biological agents ruled out: Secondary | ICD-10-CM | POA: Diagnosis not present

## 2023-10-14 DIAGNOSIS — R051 Acute cough: Secondary | ICD-10-CM | POA: Diagnosis not present

## 2023-10-14 DIAGNOSIS — R0602 Shortness of breath: Secondary | ICD-10-CM | POA: Diagnosis not present

## 2023-10-15 IMAGING — MG MM DIGITAL SCREENING BILAT W/ TOMO AND CAD
8 of 16 series · 8 of 40 positions shown · non-contrast
Comparison: Previous exam(s).

CLINICAL DATA: Screening.

EXAM:
DIGITAL SCREENING BILATERAL MAMMOGRAM WITH TOMOSYNTHESIS AND CAD
TECHNIQUE: Bilateral screening digital craniocaudal and mediolateral oblique
mammograms were obtained. Bilateral screening digital breast
tomosynthesis was performed. The images were evaluated with
computer-aided detection.

[L CC synth-2D (1 of 3)]
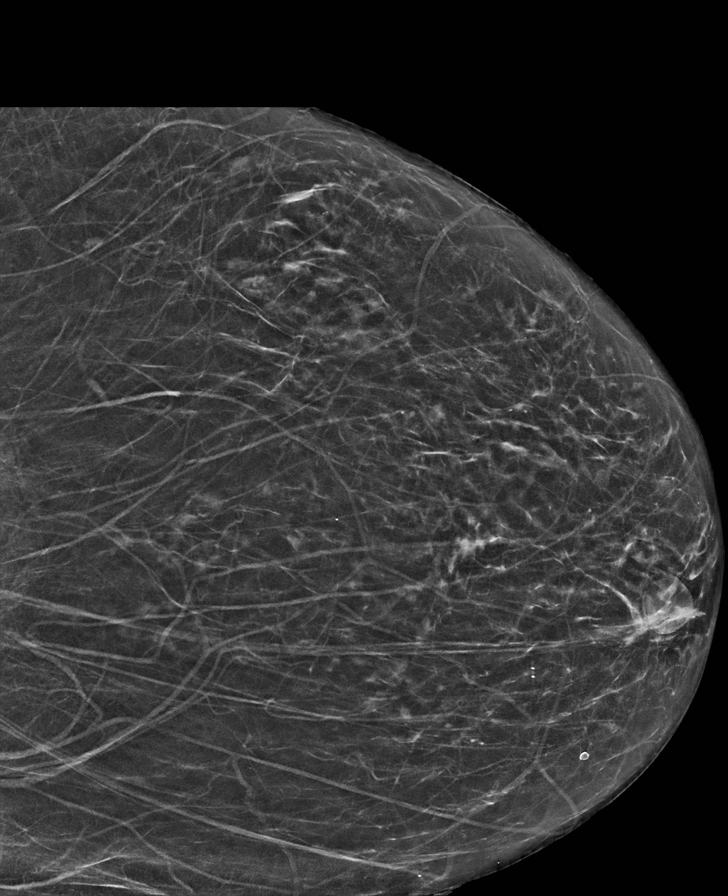

[R MLO synth-2D (1 of 2)]
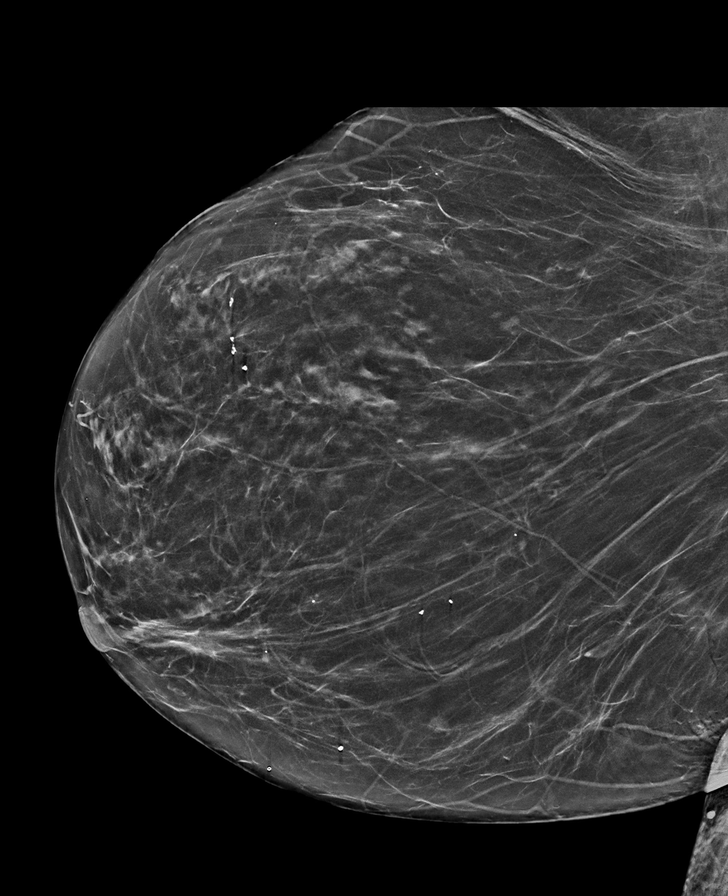

[R CC synth-2D (1 of 2)]
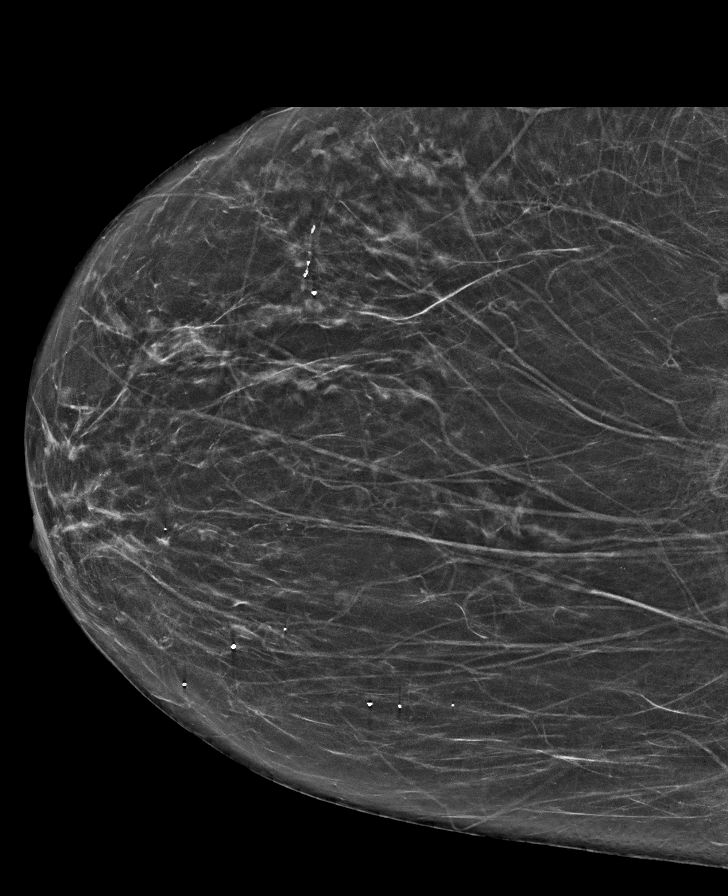

[R MLO synth-2D (2 of 2)]
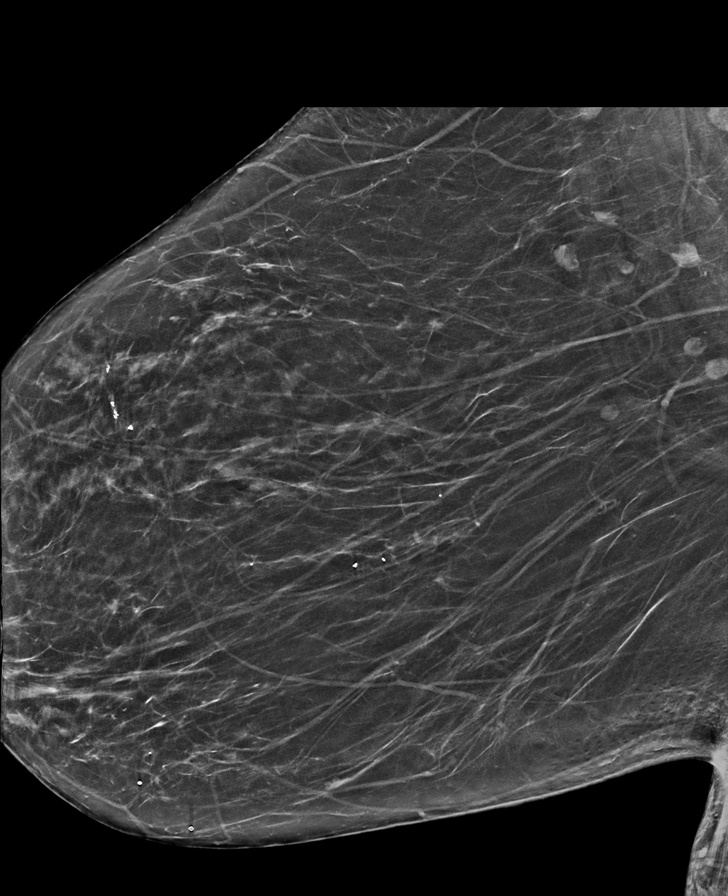

[L CC synth-2D (2 of 3)]
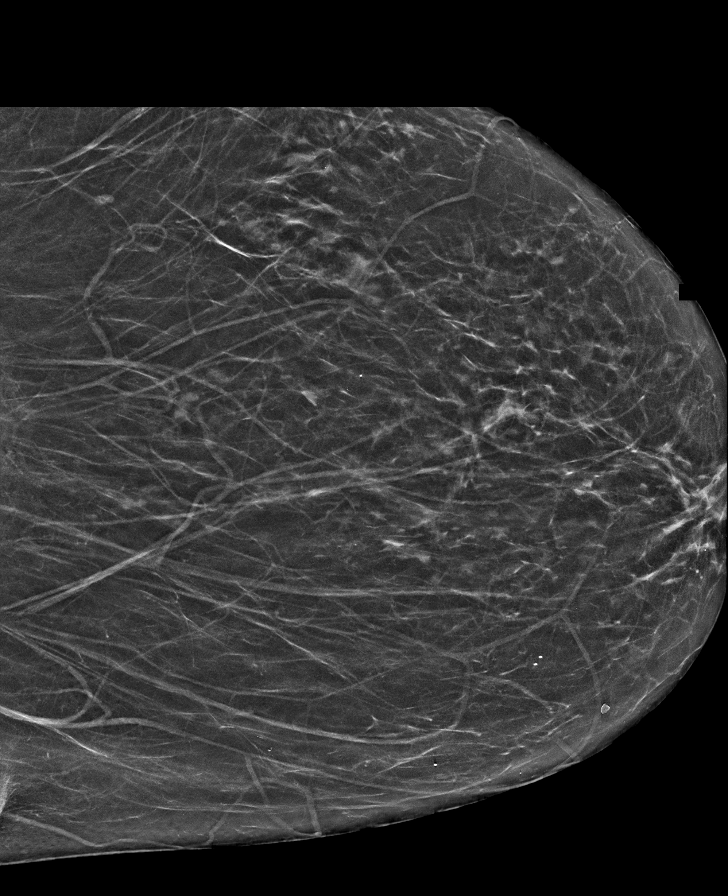

[L CC synth-2D (3 of 3)]
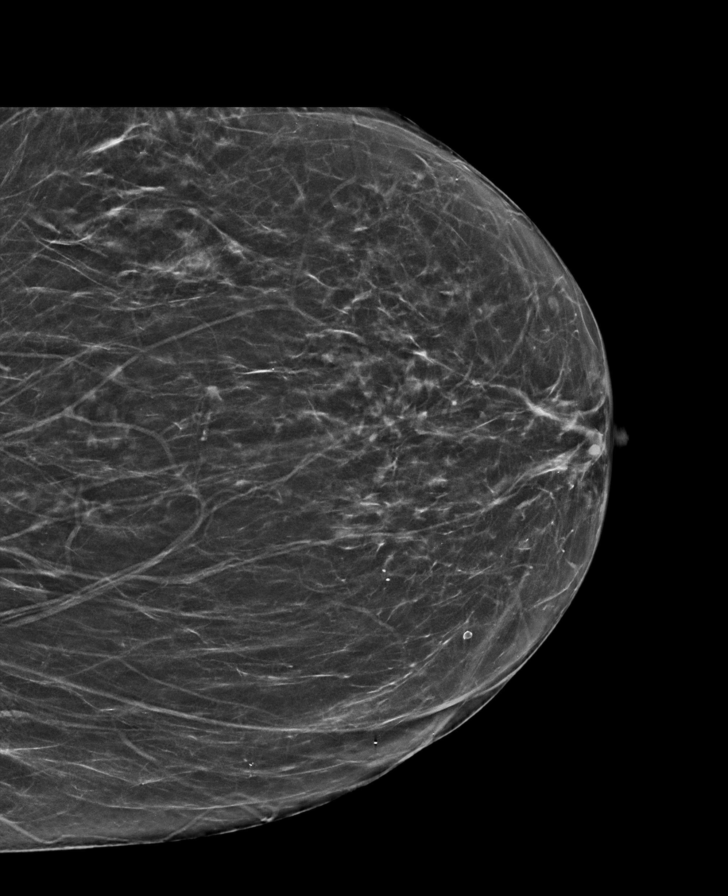

[R CC synth-2D (2 of 2)]
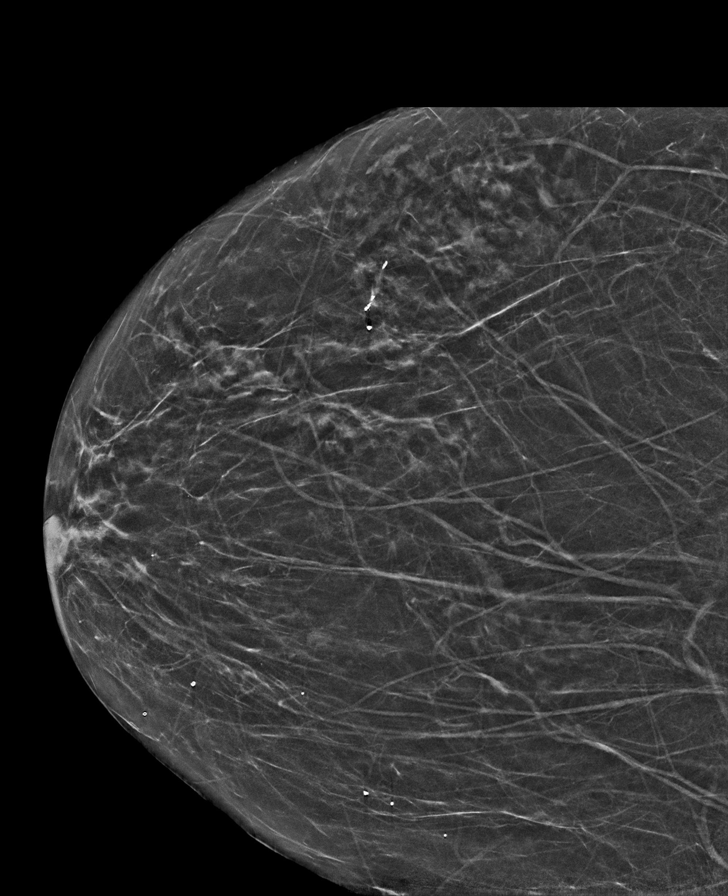

[L CC tomo · tomo slice 30/59.0]
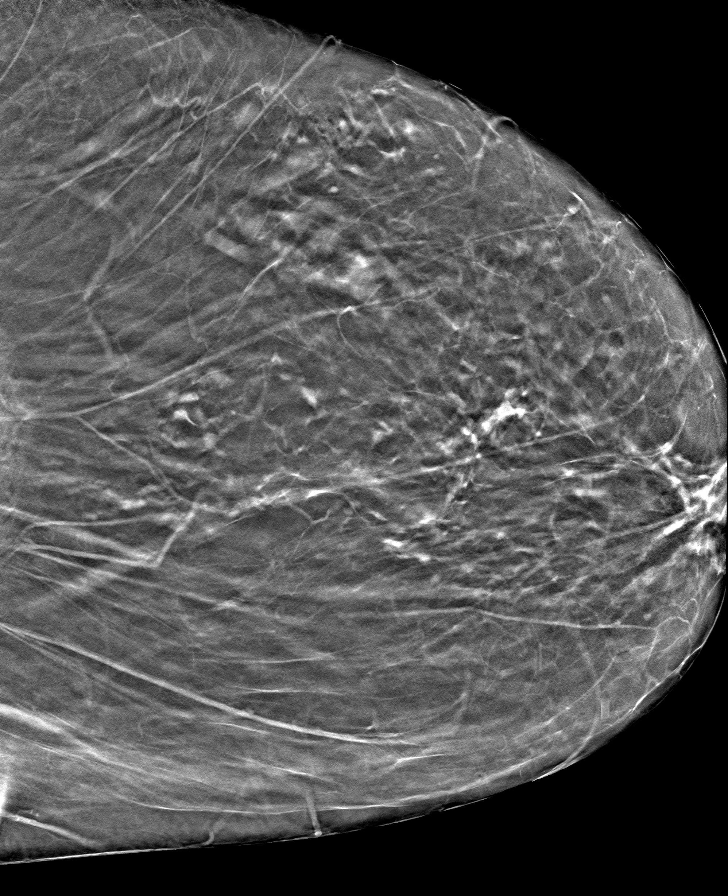

[8 of 40 positions shown; findings below may reference images not displayed]

ACR Breast Density Category b: There are scattered areas of
fibroglandular density.
FINDINGS: There are no findings suspicious for malignancy.
IMPRESSION: No mammographic evidence of malignancy. A result letter of this
screening mammogram will be mailed directly to the patient.

RECOMMENDATION:
Screening mammogram in one year. (Code:51-O-LD2)

BI-RADS CATEGORY  1: Negative.

## 2023-10-17 DIAGNOSIS — T781XXA Other adverse food reactions, not elsewhere classified, initial encounter: Secondary | ICD-10-CM | POA: Diagnosis not present

## 2023-10-17 DIAGNOSIS — F322 Major depressive disorder, single episode, severe without psychotic features: Secondary | ICD-10-CM | POA: Diagnosis not present

## 2023-10-17 DIAGNOSIS — M47812 Spondylosis without myelopathy or radiculopathy, cervical region: Secondary | ICD-10-CM | POA: Diagnosis not present

## 2023-10-17 DIAGNOSIS — M47816 Spondylosis without myelopathy or radiculopathy, lumbar region: Secondary | ICD-10-CM | POA: Diagnosis not present

## 2023-10-17 DIAGNOSIS — E1142 Type 2 diabetes mellitus with diabetic polyneuropathy: Secondary | ICD-10-CM | POA: Diagnosis not present

## 2023-10-17 DIAGNOSIS — E7800 Pure hypercholesterolemia, unspecified: Secondary | ICD-10-CM | POA: Diagnosis not present

## 2023-10-17 DIAGNOSIS — K5730 Diverticulosis of large intestine without perforation or abscess without bleeding: Secondary | ICD-10-CM | POA: Diagnosis not present

## 2023-10-17 DIAGNOSIS — I2510 Atherosclerotic heart disease of native coronary artery without angina pectoris: Secondary | ICD-10-CM | POA: Diagnosis not present

## 2023-10-17 DIAGNOSIS — Z6836 Body mass index (BMI) 36.0-36.9, adult: Secondary | ICD-10-CM | POA: Diagnosis not present

## 2023-10-17 DIAGNOSIS — K219 Gastro-esophageal reflux disease without esophagitis: Secondary | ICD-10-CM | POA: Diagnosis not present

## 2023-10-17 DIAGNOSIS — I119 Hypertensive heart disease without heart failure: Secondary | ICD-10-CM | POA: Diagnosis not present

## 2023-10-17 DIAGNOSIS — M797 Fibromyalgia: Secondary | ICD-10-CM | POA: Diagnosis not present

## 2023-10-17 DIAGNOSIS — G894 Chronic pain syndrome: Secondary | ICD-10-CM | POA: Diagnosis not present

## 2023-10-17 DIAGNOSIS — E1169 Type 2 diabetes mellitus with other specified complication: Secondary | ICD-10-CM | POA: Diagnosis not present

## 2023-11-13 DIAGNOSIS — T7800XA Anaphylactic reaction due to unspecified food, initial encounter: Secondary | ICD-10-CM | POA: Diagnosis not present

## 2023-11-13 DIAGNOSIS — J441 Chronic obstructive pulmonary disease with (acute) exacerbation: Secondary | ICD-10-CM | POA: Diagnosis not present

## 2023-12-12 DIAGNOSIS — E1142 Type 2 diabetes mellitus with diabetic polyneuropathy: Secondary | ICD-10-CM | POA: Diagnosis not present

## 2023-12-12 DIAGNOSIS — M47812 Spondylosis without myelopathy or radiculopathy, cervical region: Secondary | ICD-10-CM | POA: Diagnosis not present

## 2023-12-12 DIAGNOSIS — M47816 Spondylosis without myelopathy or radiculopathy, lumbar region: Secondary | ICD-10-CM | POA: Diagnosis not present

## 2023-12-12 DIAGNOSIS — G894 Chronic pain syndrome: Secondary | ICD-10-CM | POA: Diagnosis not present

## 2023-12-19 ENCOUNTER — Other Ambulatory Visit: Payer: Self-pay | Admitting: Cardiology

## 2023-12-19 DIAGNOSIS — I10 Essential (primary) hypertension: Secondary | ICD-10-CM

## 2023-12-29 ENCOUNTER — Emergency Department (HOSPITAL_COMMUNITY): Payer: Medicare Other

## 2023-12-29 ENCOUNTER — Inpatient Hospital Stay (HOSPITAL_COMMUNITY)
Admission: EM | Admit: 2023-12-29 | Discharge: 2023-12-31 | DRG: 176 | Disposition: A | Discharge status: Home / Self Care | Payer: Medicare Other | Attending: Internal Medicine | Admitting: Internal Medicine

## 2023-12-29 ENCOUNTER — Other Ambulatory Visit: Payer: Self-pay

## 2023-12-29 ENCOUNTER — Encounter (HOSPITAL_COMMUNITY): Payer: Self-pay

## 2023-12-29 DIAGNOSIS — Z79899 Other long term (current) drug therapy: Secondary | ICD-10-CM

## 2023-12-29 DIAGNOSIS — F32A Depression, unspecified: Secondary | ICD-10-CM | POA: Diagnosis present

## 2023-12-29 DIAGNOSIS — R0902 Hypoxemia: Secondary | ICD-10-CM | POA: Diagnosis present

## 2023-12-29 DIAGNOSIS — Z96651 Presence of right artificial knee joint: Secondary | ICD-10-CM | POA: Diagnosis present

## 2023-12-29 DIAGNOSIS — Z880 Allergy status to penicillin: Secondary | ICD-10-CM | POA: Diagnosis not present

## 2023-12-29 DIAGNOSIS — Z9861 Coronary angioplasty status: Secondary | ICD-10-CM | POA: Diagnosis not present

## 2023-12-29 DIAGNOSIS — Z955 Presence of coronary angioplasty implant and graft: Secondary | ICD-10-CM

## 2023-12-29 DIAGNOSIS — I10 Essential (primary) hypertension: Secondary | ICD-10-CM | POA: Diagnosis present

## 2023-12-29 DIAGNOSIS — Z86711 Personal history of pulmonary embolism: Secondary | ICD-10-CM | POA: Diagnosis not present

## 2023-12-29 DIAGNOSIS — I2694 Multiple subsegmental pulmonary emboli without acute cor pulmonale: Secondary | ICD-10-CM | POA: Diagnosis not present

## 2023-12-29 DIAGNOSIS — Z6838 Body mass index (BMI) 38.0-38.9, adult: Secondary | ICD-10-CM

## 2023-12-29 DIAGNOSIS — I2699 Other pulmonary embolism without acute cor pulmonale: Principal | ICD-10-CM | POA: Diagnosis present

## 2023-12-29 DIAGNOSIS — Z90710 Acquired absence of both cervix and uterus: Secondary | ICD-10-CM

## 2023-12-29 DIAGNOSIS — R0602 Shortness of breath: Secondary | ICD-10-CM | POA: Diagnosis not present

## 2023-12-29 DIAGNOSIS — Z91014 Allergy to mammalian meats: Secondary | ICD-10-CM

## 2023-12-29 DIAGNOSIS — I2510 Atherosclerotic heart disease of native coronary artery without angina pectoris: Secondary | ICD-10-CM

## 2023-12-29 DIAGNOSIS — Z9049 Acquired absence of other specified parts of digestive tract: Secondary | ICD-10-CM

## 2023-12-29 DIAGNOSIS — E119 Type 2 diabetes mellitus without complications: Secondary | ICD-10-CM

## 2023-12-29 DIAGNOSIS — Z8616 Personal history of COVID-19: Secondary | ICD-10-CM | POA: Diagnosis not present

## 2023-12-29 DIAGNOSIS — I700 Atherosclerosis of aorta: Secondary | ICD-10-CM | POA: Diagnosis not present

## 2023-12-29 DIAGNOSIS — I82402 Acute embolism and thrombosis of unspecified deep veins of left lower extremity: Secondary | ICD-10-CM | POA: Diagnosis present

## 2023-12-29 DIAGNOSIS — K219 Gastro-esophageal reflux disease without esophagitis: Secondary | ICD-10-CM | POA: Diagnosis present

## 2023-12-29 DIAGNOSIS — Z8249 Family history of ischemic heart disease and other diseases of the circulatory system: Secondary | ICD-10-CM

## 2023-12-29 DIAGNOSIS — E785 Hyperlipidemia, unspecified: Secondary | ICD-10-CM | POA: Diagnosis present

## 2023-12-29 DIAGNOSIS — Z881 Allergy status to other antibiotic agents status: Secondary | ICD-10-CM | POA: Diagnosis not present

## 2023-12-29 DIAGNOSIS — Z7984 Long term (current) use of oral hypoglycemic drugs: Secondary | ICD-10-CM | POA: Diagnosis not present

## 2023-12-29 DIAGNOSIS — M797 Fibromyalgia: Secondary | ICD-10-CM | POA: Diagnosis present

## 2023-12-29 DIAGNOSIS — Z888 Allergy status to other drugs, medicaments and biological substances status: Secondary | ICD-10-CM | POA: Diagnosis not present

## 2023-12-29 DIAGNOSIS — R072 Precordial pain: Secondary | ICD-10-CM | POA: Diagnosis present

## 2023-12-29 DIAGNOSIS — G8929 Other chronic pain: Secondary | ICD-10-CM | POA: Diagnosis present

## 2023-12-29 DIAGNOSIS — R079 Chest pain, unspecified: Secondary | ICD-10-CM | POA: Diagnosis not present

## 2023-12-29 DIAGNOSIS — M545 Low back pain, unspecified: Secondary | ICD-10-CM | POA: Diagnosis present

## 2023-12-29 LAB — CBC
HCT: 42.5 % (ref 36.0–46.0)
Hemoglobin: 14.1 g/dL (ref 12.0–15.0)
MCH: 32.3 pg (ref 26.0–34.0)
MCHC: 33.2 g/dL (ref 30.0–36.0)
MCV: 97.3 fL (ref 80.0–100.0)
Platelets: 204 10*3/uL (ref 150–400)
RBC: 4.37 MIL/uL (ref 3.87–5.11)
RDW: 13.3 % (ref 11.5–15.5)
WBC: 5.6 10*3/uL (ref 4.0–10.5)
nRBC: 0 % (ref 0.0–0.2)

## 2023-12-29 LAB — BASIC METABOLIC PANEL
Anion gap: 11 (ref 5–15)
BUN: 19 mg/dL (ref 8–23)
CO2: 28 mmol/L (ref 22–32)
Calcium: 9.3 mg/dL (ref 8.9–10.3)
Chloride: 103 mmol/L (ref 98–111)
Creatinine, Ser: 0.75 mg/dL (ref 0.44–1.00)
GFR, Estimated: >60 mL/min (ref >60)
Glucose, Bld: 121 mg/dL — ABNORMAL HIGH (ref 70–99)
Potassium: 3.7 mmol/L (ref 3.5–5.1)
Sodium: 142 mmol/L (ref 135–145)

## 2023-12-29 LAB — TROPONIN I (HIGH SENSITIVITY)
Troponin I (High Sensitivity): 8 ng/L (ref <18)
Troponin I (High Sensitivity): 7 ng/L (ref <18)

## 2023-12-29 LAB — D-DIMER, QUANTITATIVE: D-Dimer, Quant: 1.68 ug{FEU}/mL — ABNORMAL HIGH (ref 0.00–0.50)

## 2023-12-29 MED ORDER — ASPIRIN 325 MG PO TABS
325.0000 mg | ORAL_TABLET | Freq: Every day | ORAL | Status: DC
  Administered 2023-12-29: 325 mg via ORAL
  Filled 2023-12-29: qty 1

## 2023-12-29 MED ORDER — MORPHINE SULFATE (PF) 4 MG/ML IV SOLN
4.0000 mg | Freq: Once | INTRAVENOUS | Status: AC
  Administered 2023-12-29: 4 mg via INTRAVENOUS
  Filled 2023-12-29: qty 1

## 2023-12-29 MED ORDER — ONDANSETRON HCL 4 MG/2ML IJ SOLN
4.0000 mg | Freq: Once | INTRAMUSCULAR | Status: AC
  Administered 2023-12-29: 4 mg via INTRAVENOUS
  Filled 2023-12-29: qty 2

## 2023-12-29 NOTE — ED Provider Notes (Signed)
[place] [facility] AT [facility] Provider Note   CSN: [id number] Arrival date & time: 12/29/23  1836     History  Chief Complaint  Patient presents with   Chest Pain    Courtney Steele is a 73 y.o. female.  The history is provided by the patient and medical records. No language interpreter was used.  Chest Pain    73 year old female with any history of CAD status post drug-eluting stents in 2012 on the LAD, fibromyalgia's, obesity, hypertension, hyperlipidemia, diabetes presenting with complaint of chest pain.  Patient report for the past 2 to 3 days she has had exertional shortness of breath that seems to relieve with rest however today she also endorsed having chest discomfort with it.  She endorsed a centralized chest pressure radiates to her arms and her jaw with some fatigue.  She tries taking her sublingual nitro, states it only makes her lips tingles but did not resolve her pain.  She waited for period time to take another dose of sublingual nitro without any relief thus prompting this ER visit.  She is currently rating her chest discomfort as 5 out of 10.  She denies any diaphoresis significant nausea.  She denies any fever or chills no productive cough no hemoptysis no prior history of PE or DVT no recent surgery or prolonged bedrest she denies any increased fluid retention.  She was given 325 mg of aspirin an hour ago but has not noticed any improvement.  She has not had a cardiac stress test since 2012.  She denies tobacco use or significant alcohol use.  Home Medications Prior to Admission medications   Medication Sig Start Date End Date Taking? Authorizing Provider  acetaminophen (TYLENOL) 500 MG tablet Take 500 mg by mouth every 6 (six) hours as needed for moderate pain.    [provider]  albuterol (PROVENTIL HFA;VENTOLIN HFA) 108 (90 BASE) MCG/ACT inhaler Inhale 1-2 puffs into the lungs every 6 (six) hours as needed for wheezing or shortness  of breath. 05/10/15   Mirian Mo, MD  amLODipine (NORVASC) 2.5 MG tablet TAKE 1 TABLET(2.5 MG) BY MOUTH DAILY 12/19/23   [place], Peter M, MD  atorvastatin (LIPITOR) 40 MG tablet Take 1 tablet (40 mg total) by mouth daily. 02/25/23   Joylene Grapes, NP  b complex vitamins capsule Take 1 capsule by mouth daily.    [provider]  Calcium Carb-Cholecalciferol (CALCIUM + VITAMIN D3 PO) Take 1 tablet by mouth daily.    [provider]  cholecalciferol (VITAMIN D3) 25 MCG (1000 UNIT) tablet Take 1,000 Units by mouth daily.    [provider]  cyanocobalamin (VITAMIN B12) 500 MCG tablet Take 500 mcg by mouth daily.    [provider]  cyclobenzaprine (FLEXERIL) 5 MG tablet Take 5-10 mg by mouth 3 (three) times daily as needed for muscle spasms.    [provider]  diphenhydrAMINE (BENADRYL) 25 MG tablet Take 1 tablet (25 mg total) by mouth every 6 (six) hours for 5 days. Patient not taking: Reported on 09/28/2022 07/18/21 07/23/21  Nira Conn, MD  DULoxetine (CYMBALTA) 30 MG capsule Take 90 mg by mouth daily. 08/18/18   [provider]  empagliflozin (JARDIANCE) 10 MG TABS tablet Take 10 mg by mouth daily.    [provider]  EPINEPHrine 0.3 mg/0.3 mL IJ SOAJ injection Inject 0.3 mg into the muscle as needed for anaphylaxis. 09/22/22   Long, Arlyss Repress, MD  ezetimibe (ZETIA) 10  MG tablet Take 1 tablet (10 mg total) by mouth daily. 02/25/23   Joylene Grapes, NP  famotidine (PEPCID) 20 MG tablet Take 1 tablet (20 mg total) by mouth daily for 5 days. 07/18/21 12/25/22  Nira Conn, MD  gabapentin (NEURONTIN) 100 MG capsule Take 200 mg by mouth at bedtime. 08/07/22   [provider]  hydrOXYzine (ATARAX) 25 MG tablet Take 1 tablet (25 mg total) by mouth every 6 (six) hours as needed for itching. Patient not taking: Reported on 12/25/2022 09/22/22   Maia Plan, MD  isosorbide mononitrate (IMDUR) 30 MG 24 hr tablet TAKE 1  TABLET(30 MG) BY MOUTH DAILY 06/24/23   [place], Peter M, MD  MELATONIN PO Take 1 tablet by mouth daily.    [provider]  metoprolol succinate (TOPROL-XL) 25 MG 24 hr tablet TAKE 1 TABLET BY MOUTH DAILY 03/29/23   [place], Peter M, MD  nitroGLYCERIN (NITROSTAT) 0.4 MG SL tablet Place 1 tablet (0.4 mg total) under the tongue every 5 (five) minutes as needed. For chest pain 12/22/20   [place], Peter M, MD  omega-3 acid ethyl esters (LOVAZA) 1 g capsule Take 4 capsules (4 g total) by mouth daily. 03/20/23 03/18/24  [place], Peter M, MD  oxyCODONE (ROXICODONE) 5 MG immediate release tablet Take 1 tablet (5 mg total) by mouth every 4 (four) hours as needed for severe pain. Patient not taking: Reported on 12/25/2022 10/12/22   Clois Dupes, PA-C  pantoprazole (PROTONIX) 40 MG tablet TAKE 1 TABLET(40 MG) BY MOUTH DAILY 05/16/23   [place], Peter M, MD  ranolazine (RANEXA) 1000 MG SR tablet TAKE 1 TABLET(1000 MG) BY MOUTH TWICE DAILY 01/28/23   [place], Peter M, MD  triamcinolone cream (KENALOG) 0.1 % Apply 1 Application topically 2 (two) times daily. Patient not taking: Reported on 09/28/2022 08/06/22   Wallis Bamberg, PA-C  TURMERIC PO Take 1 capsule by mouth daily.    [provider]      Allergies    Beef-derived drug products, Other, Benazepril, Erythromycin, Indomethacin, Lisinopril, Biaxin [clarithromycin], Clarithromycin, and Penicillins    Review of Systems   Review of Systems  Cardiovascular:  Positive for chest pain.  All other systems reviewed and are negative.   Physical Exam Updated Vital Signs BP (!) 161/89 (BP Location: Right Arm)   Pulse 88   Temp 98.7 F (37.1 C)   Resp 17   Ht 5\' 3"  (1.6 m)   Wt 99.8 kg   SpO2 95%   BMI 38.97 kg/m  Physical Exam Vitals and nursing note reviewed.  Constitutional:      General: She is not in acute distress.    Appearance: She is well-developed.  HENT:     Head: Atraumatic.  Eyes:     Conjunctiva/sclera: Conjunctivae normal.   Cardiovascular:     Rate and Rhythm: Normal rate and regular rhythm.     Pulses: Normal pulses.     Heart sounds: Normal heart sounds.  Pulmonary:     Effort: Pulmonary effort is normal.  Chest:     Chest wall: No tenderness.  Abdominal:     Palpations: Abdomen is soft.     Tenderness: There is no abdominal tenderness.  Musculoskeletal:     Cervical back: Neck supple.     Right lower leg: No edema.     Left lower leg: No edema.  Skin:    Capillary Refill: Capillary refill takes less than 2 seconds.     Findings: No  rash.  Neurological:     Mental Status: She is alert and oriented to person, place, and time.  Psychiatric:        Mood and Affect: Mood normal.     ED Results / Procedures / Treatments   Labs (all labs ordered are listed, but only abnormal results are displayed) Labs Reviewed  BASIC METABOLIC PANEL - Abnormal; Notable for the following components:      Result Value   Glucose, Bld 121 (*)    All other components within normal limits  D-DIMER, QUANTITATIVE - Abnormal; Notable for the following components:   D-Dimer, Quant 1.68 (*)    All other components within normal limits  CBC  TROPONIN I (HIGH SENSITIVITY)  TROPONIN I (HIGH SENSITIVITY)    EKG None ED ECG REPORT   Date: 12/29/2023  Rate: 76  Rhythm: normal sinus rhythm  QRS Axis: left  Intervals: normal  ST/T Wave abnormalities: nonspecific T wave changes  Conduction Disutrbances:left anterior fascicular block  Narrative Interpretation:   Old EKG Reviewed: unchanged  I have personally reviewed the EKG tracing and agree with the computerized printout as noted.   Radiology DG Chest 2 View Result Date: 12/29/2023 CLINICAL DATA:  Chest pain. EXAM: CHEST - 2 VIEW COMPARISON:  Chest radiograph dated 11/26/2020. FINDINGS: The heart size and mediastinal contours are within normal limits. Vascular calcifications are seen in the aortic arch. Both lungs are clear. Degenerative changes are seen in the  spine. IMPRESSION: No active cardiopulmonary disease. Electronically Signed   By: Romona Curls M.D.   On: 12/29/2023 19:45    Procedures Procedures    Medications Ordered in ED Medications  aspirin tablet 325 mg (325 mg Oral Given 12/29/23 2043)  morphine (PF) 4 MG/ML injection 4 mg (has no administration in time range)  ondansetron (ZOFRAN) injection 4 mg (has no administration in time range)    ED Course/ Medical Decision Making/ A&P                                 Medical Decision Making Amount and/or Complexity of Data Reviewed Labs: ordered. Radiology: ordered. ECG/medicine tests: ordered.  Risk Prescription drug management.   BP (!) 159/77   Pulse 75   Temp 98.7 F (37.1 C)   Resp 18   Ht 5\' 3"  (1.6 m)   Wt 99.8 kg   SpO2 99%   BMI 38.97 kg/m   40:63 PM 73 year old female with any history of CAD status post drug-eluting stents in 2012 on the LAD, fibromyalgia's, obesity, hypertension, hyperlipidemia, diabetes presenting with complaint of chest pain.  Patient report for the past 2 to 3 days she has had exertional shortness of breath that seems to relieve with rest however today she also endorsed having chest discomfort with it.  She endorsed a centralized chest pressure radiates to her arms and her jaw with some fatigue.  She tries taking her sublingual nitro, states it only makes her lips tingles but did not resolve her pain.  She waited for period time to take another dose of sublingual nitro without any relief thus prompting this ER visit.  She is currently rating her chest discomfort as 5 out of 10.  She denies any diaphoresis significant nausea.  She denies any fever or chills no productive cough no hemoptysis no prior history of PE or DVT no recent surgery or prolonged bedrest she denies any increased fluid retention.  She was  given 325 mg of aspirin an hour ago but has not noticed any improvement.  She has not had a cardiac stress test since 2012.  She denies  tobacco use or significant alcohol use.  On exam this a well-appearing female resting comfortably in bed appears to be in no acute discomfort.  Heart with normal rate and rhythm, lungs are clear to auscultation bilaterally abdomen is soft nontender no peripheral edema noted.  She has intact distal pulses to her extremities.  -Labs ordered, independently viewed and interpreted by me.  Labs remarkable for normal initial trop.  However she does have mildly elevated D-dimer of 1.68, will obtain chest CT angiogram to rule out PE. -The patient was maintained on a cardiac monitor.  I personally viewed and interpreted the cardiac monitored which showed an underlying rhythm of: NSR -Imaging independently viewed and interpreted by me and I agree with radiologist's interpretation.  Result remarkable for initial CXR without acute changes -This patient presents to the ED for concern of CP, this involves an extensive number of treatment options, and is a complaint that carries with it a high risk of complications and morbidity.  The differential diagnosis includes CAD, ACS, PE, PTX, PNA, costochondritis, gastritis, gerd -Co morbidities that complicate the patient evaluation includes CAD, HTN, DM, HLD, fibromyalgia -Treatment includes morphine, zofran, asa -Reevaluation of the patient after these medicines showed that the patient improved -PCP office notes or outside notes reviewed -Discussion with oncoming provider who will f/u on result of chest CTA and will consult for admission.   -Escalation to admission/observation considered: patient is agreeable with admission.   Patient has a heart score 6, which puts her at moderate risk for Mace.  She will need to be admitted for further cardiac workup.  She is currently chest pain-free.  11:29 PM Nurse report O2 sats at 88% on RA, pt placed on 2L O2.          Final Clinical Impression(s) / ED Diagnoses Final diagnoses:  None    Rx / DC Orders ED  Discharge Orders     None         Fayrene Helper, PA-C 12/29/23 2334    Vanetta Mulders, MD 01/01/24 1421

## 2023-12-29 NOTE — ED Notes (Signed)
Patient O2 saturation was staying between 88% and 89%, patient was placed on 2 liters [place] and oxygen has remained between 96% and 98%.

## 2023-12-29 NOTE — ED Provider Triage Note (Signed)
Emergency Medicine Provider Triage Evaluation Note  Courtney Steele , a 73 y.o. female  was evaluated in triage.  Pt complains of SOB and CP. She reports that SOB started two days ago then today developed midsternal CP that radiates into jaw around 1600 while watching TV. Reports SOB worsens with exertion. Took two NTG PTA. CP lasted from 1600-1800.  Currently no CP nor shortness  No cough, congestion, fevers, worsening pedal edema. Hx of 1 stent  Review of Systems  Positive: CP, SOB Negative: No cough, congestion, fevers  Physical Exam  BP (!) 161/89 (BP Location: Right Arm)   Pulse 88   Temp 98.7 F (37.1 C)   Resp 17   Ht 5\' 3"  (1.6 m)   Wt 99.8 kg   SpO2 95%   BMI 38.97 kg/m  Gen:   Awake, no distress   Resp:  Normal effort  MSK:   Moves extremities without difficulty  Other:  Mild pedal edema, non pitting  Medical Decision Making  Medically screening exam initiated at 7:37 PM.  Appropriate orders placed.  Karren Cobble was informed that the remainder of the evaluation will be completed by another provider, this initial triage assessment does not replace that evaluation, and the importance of remaining in the ED until their evaluation is complete.  Workup started   Judithann Sheen, [place] 12/29/23 1942

## 2023-12-29 NOTE — ED Triage Notes (Addendum)
Pt presents with ShOB that started 2 days ago and then today pt developed centralized chest pressure that radiates into her jaw that started while sitting. Pt states the ShOB is worse with exertion. Pt denies nausea or diaphoresis. Pt took 2 NTG PTA without relief.

## 2023-12-30 ENCOUNTER — Observation Stay (HOSPITAL_COMMUNITY): Payer: Medicare Other

## 2023-12-30 ENCOUNTER — Emergency Department (HOSPITAL_COMMUNITY): Payer: Medicare Other

## 2023-12-30 ENCOUNTER — Encounter (HOSPITAL_COMMUNITY): Payer: Self-pay | Admitting: Internal Medicine

## 2023-12-30 DIAGNOSIS — E119 Type 2 diabetes mellitus without complications: Secondary | ICD-10-CM

## 2023-12-30 DIAGNOSIS — Z7984 Long term (current) use of oral hypoglycemic drugs: Secondary | ICD-10-CM | POA: Diagnosis not present

## 2023-12-30 DIAGNOSIS — I700 Atherosclerosis of aorta: Secondary | ICD-10-CM | POA: Diagnosis present

## 2023-12-30 DIAGNOSIS — Z8249 Family history of ischemic heart disease and other diseases of the circulatory system: Secondary | ICD-10-CM | POA: Diagnosis not present

## 2023-12-30 DIAGNOSIS — Z86711 Personal history of pulmonary embolism: Secondary | ICD-10-CM | POA: Diagnosis not present

## 2023-12-30 DIAGNOSIS — G8929 Other chronic pain: Secondary | ICD-10-CM | POA: Diagnosis present

## 2023-12-30 DIAGNOSIS — Z881 Allergy status to other antibiotic agents status: Secondary | ICD-10-CM | POA: Diagnosis not present

## 2023-12-30 DIAGNOSIS — Z880 Allergy status to penicillin: Secondary | ICD-10-CM | POA: Diagnosis not present

## 2023-12-30 DIAGNOSIS — M545 Low back pain, unspecified: Secondary | ICD-10-CM | POA: Diagnosis present

## 2023-12-30 DIAGNOSIS — E785 Hyperlipidemia, unspecified: Secondary | ICD-10-CM | POA: Diagnosis present

## 2023-12-30 DIAGNOSIS — I2699 Other pulmonary embolism without acute cor pulmonale: Secondary | ICD-10-CM | POA: Diagnosis present

## 2023-12-30 DIAGNOSIS — R0602 Shortness of breath: Secondary | ICD-10-CM | POA: Diagnosis not present

## 2023-12-30 DIAGNOSIS — Z96651 Presence of right artificial knee joint: Secondary | ICD-10-CM | POA: Diagnosis present

## 2023-12-30 DIAGNOSIS — M797 Fibromyalgia: Secondary | ICD-10-CM | POA: Diagnosis present

## 2023-12-30 DIAGNOSIS — Z888 Allergy status to other drugs, medicaments and biological substances status: Secondary | ICD-10-CM | POA: Diagnosis not present

## 2023-12-30 DIAGNOSIS — I10 Essential (primary) hypertension: Secondary | ICD-10-CM | POA: Diagnosis present

## 2023-12-30 DIAGNOSIS — R0902 Hypoxemia: Secondary | ICD-10-CM | POA: Diagnosis present

## 2023-12-30 DIAGNOSIS — F32A Depression, unspecified: Secondary | ICD-10-CM | POA: Diagnosis present

## 2023-12-30 DIAGNOSIS — Z79899 Other long term (current) drug therapy: Secondary | ICD-10-CM | POA: Diagnosis not present

## 2023-12-30 DIAGNOSIS — K219 Gastro-esophageal reflux disease without esophagitis: Secondary | ICD-10-CM | POA: Diagnosis present

## 2023-12-30 DIAGNOSIS — Z9861 Coronary angioplasty status: Secondary | ICD-10-CM

## 2023-12-30 DIAGNOSIS — I2510 Atherosclerotic heart disease of native coronary artery without angina pectoris: Secondary | ICD-10-CM

## 2023-12-30 DIAGNOSIS — I82402 Acute embolism and thrombosis of unspecified deep veins of left lower extremity: Secondary | ICD-10-CM | POA: Diagnosis present

## 2023-12-30 DIAGNOSIS — Z8616 Personal history of COVID-19: Secondary | ICD-10-CM | POA: Diagnosis not present

## 2023-12-30 DIAGNOSIS — R072 Precordial pain: Secondary | ICD-10-CM | POA: Diagnosis present

## 2023-12-30 DIAGNOSIS — Z955 Presence of coronary angioplasty implant and graft: Secondary | ICD-10-CM | POA: Diagnosis not present

## 2023-12-30 LAB — CBC WITH DIFFERENTIAL/PLATELET
Abs Immature Granulocytes: 0.01 10*3/uL (ref 0.00–0.07)
Basophils Absolute: 0 10*3/uL (ref 0.0–0.1)
Basophils Relative: 1 %
Eosinophils Absolute: 0.2 10*3/uL (ref 0.0–0.5)
Eosinophils Relative: 4 %
HCT: 40.1 % (ref 36.0–46.0)
Hemoglobin: 13.2 g/dL (ref 12.0–15.0)
Immature Granulocytes: 0 %
Lymphocytes Relative: 42 %
Lymphs Abs: 2.5 10*3/uL (ref 0.7–4.0)
MCH: 32.1 pg (ref 26.0–34.0)
MCHC: 32.9 g/dL (ref 30.0–36.0)
MCV: 97.6 fL (ref 80.0–100.0)
Monocytes Absolute: 0.5 10*3/uL (ref 0.1–1.0)
Monocytes Relative: 9 %
Neutro Abs: 2.7 10*3/uL (ref 1.7–7.7)
Neutrophils Relative %: 44 %
Platelets: 177 10*3/uL (ref 150–400)
RBC: 4.11 MIL/uL (ref 3.87–5.11)
RDW: 13.4 % (ref 11.5–15.5)
WBC: 6 10*3/uL (ref 4.0–10.5)
nRBC: 0 % (ref 0.0–0.2)

## 2023-12-30 LAB — BASIC METABOLIC PANEL
Anion gap: 9 (ref 5–15)
BUN: 15 mg/dL (ref 8–23)
CO2: 24 mmol/L (ref 22–32)
Calcium: 8.3 mg/dL — ABNORMAL LOW (ref 8.9–10.3)
Chloride: 99 mmol/L (ref 98–111)
Creatinine, Ser: 0.66 mg/dL (ref 0.44–1.00)
GFR, Estimated: >60 mL/min (ref >60)
Glucose, Bld: 110 mg/dL — ABNORMAL HIGH (ref 70–99)
Potassium: 3.6 mmol/L (ref 3.5–5.1)
Sodium: 132 mmol/L — ABNORMAL LOW (ref 135–145)

## 2023-12-30 LAB — ECHOCARDIOGRAM COMPLETE
AR max vel: 2.04 cm2
AV Area VTI: 2.25 cm2
AV Area mean vel: 2.06 cm2
AV Mean grad: 5.3 mm[Hg]
AV Peak grad: 10.3 mm[Hg]
Ao pk vel: 1.6 m/s
Area-P 1/2: 4.04 cm2
Calc EF: 74.8 %
Height: 63 in
MV VTI: 3.64 cm2
P 1/2 time: 561 ms
S' Lateral: 2.8 cm
Single Plane A2C EF: 77.2 %
Single Plane A4C EF: 70.8 %
Weight: 3520 [oz_av]

## 2023-12-30 LAB — HEPATIC FUNCTION PANEL
ALT: 17 U/L (ref 0–44)
AST: 20 U/L (ref 15–41)
Albumin: 3.3 g/dL — ABNORMAL LOW (ref 3.5–5.0)
Alkaline Phosphatase: 40 U/L (ref 38–126)
Bilirubin, Direct: 0.1 mg/dL (ref 0.0–0.2)
Indirect Bilirubin: 0.7 mg/dL (ref 0.3–0.9)
Total Bilirubin: 0.8 mg/dL (ref 0.0–1.2)
Total Protein: 6.1 g/dL — ABNORMAL LOW (ref 6.5–8.1)

## 2023-12-30 LAB — BRAIN NATRIURETIC PEPTIDE: B Natriuretic Peptide: 24.4 pg/mL (ref 0.0–100.0)

## 2023-12-30 LAB — APTT: aPTT: 42 s — ABNORMAL HIGH (ref 24–36)

## 2023-12-30 MED ORDER — APIXABAN 5 MG PO TABS
10.0000 mg | ORAL_TABLET | Freq: Two times a day (BID) | ORAL | Status: DC
  Administered 2023-12-30 – 2023-12-31 (×3): 10 mg via ORAL
  Filled 2023-12-30 (×3): qty 2

## 2023-12-30 MED ORDER — OYSTER SHELL CALCIUM/D3 500-5 MG-MCG PO TABS
1.0000 | ORAL_TABLET | Freq: Every day | ORAL | Status: DC
  Administered 2023-12-30 – 2023-12-31 (×2): 1 via ORAL
  Filled 2023-12-30 (×2): qty 1

## 2023-12-30 MED ORDER — APIXABAN 5 MG PO TABS: 5.0000 mg | ORAL_TABLET | Freq: Two times a day (BID) | ORAL | Status: DC

## 2023-12-30 MED ORDER — RANOLAZINE ER 500 MG PO TB12
1000.0000 mg | ORAL_TABLET | Freq: Two times a day (BID) | ORAL | Status: DC
  Administered 2023-12-30 – 2023-12-31 (×3): 1000 mg via ORAL
  Filled 2023-12-30 (×4): qty 2

## 2023-12-30 MED ORDER — PANTOPRAZOLE SODIUM 40 MG PO TBEC
40.0000 mg | DELAYED_RELEASE_TABLET | Freq: Every day | ORAL | Status: DC
Start: 2023-12-30 — End: 2023-12-31
  Administered 2023-12-30 – 2023-12-31 (×2): 40 mg via ORAL
  Filled 2023-12-30 (×2): qty 1

## 2023-12-30 MED ORDER — OMEGA-3-ACID ETHYL ESTERS 1 G PO CAPS
4.0000 g | ORAL_CAPSULE | Freq: Every day | ORAL | Status: DC
  Administered 2023-12-30 – 2023-12-31 (×2): 4 g via ORAL
  Filled 2023-12-30 (×2): qty 4

## 2023-12-30 MED ORDER — DULOXETINE HCL 60 MG PO CPEP
90.0000 mg | ORAL_CAPSULE | Freq: Every day | ORAL | Status: DC
Start: 2023-12-30 — End: 2023-12-31
  Administered 2023-12-30 – 2023-12-31 (×2): 90 mg via ORAL
  Filled 2023-12-30: qty 3
  Filled 2023-12-30: qty 1

## 2023-12-30 MED ORDER — CYCLOBENZAPRINE HCL 5 MG PO TABS: 5.0000 mg | ORAL_TABLET | Freq: Three times a day (TID) | ORAL | Status: DC | PRN

## 2023-12-30 MED ORDER — EMPAGLIFLOZIN 10 MG PO TABS
10.0000 mg | ORAL_TABLET | Freq: Every day | ORAL | Status: DC
  Administered 2023-12-30 – 2023-12-31 (×2): 10 mg via ORAL
  Filled 2023-12-30 (×2): qty 1

## 2023-12-30 MED ORDER — MELATONIN 3 MG PO TABS
3.0000 mg | ORAL_TABLET | Freq: Every day | ORAL | Status: DC
Start: 2023-12-30 — End: 2023-12-31
  Administered 2023-12-30: 3 mg via ORAL
  Filled 2023-12-30: qty 1

## 2023-12-30 MED ORDER — METOPROLOL SUCCINATE ER 25 MG PO TB24
25.0000 mg | ORAL_TABLET | Freq: Every day | ORAL | Status: DC
  Administered 2023-12-30 – 2023-12-31 (×2): 25 mg via ORAL
  Filled 2023-12-30 (×2): qty 1

## 2023-12-30 MED ORDER — ISOSORBIDE MONONITRATE ER 30 MG PO TB24
30.0000 mg | ORAL_TABLET | Freq: Every day | ORAL | Status: DC
Start: 2023-12-30 — End: 2023-12-31
  Administered 2023-12-30 – 2023-12-31 (×2): 30 mg via ORAL
  Filled 2023-12-30 (×2): qty 1

## 2023-12-30 MED ORDER — NITROGLYCERIN 0.4 MG SL SUBL: 0.4000 mg | SUBLINGUAL_TABLET | SUBLINGUAL | Status: DC | PRN

## 2023-12-30 MED ORDER — ATORVASTATIN CALCIUM 40 MG PO TABS
40.0000 mg | ORAL_TABLET | Freq: Every day | ORAL | Status: DC
  Administered 2023-12-30 – 2023-12-31 (×2): 40 mg via ORAL
  Filled 2023-12-30 (×2): qty 1

## 2023-12-30 MED ORDER — EZETIMIBE 10 MG PO TABS
10.0000 mg | ORAL_TABLET | Freq: Every day | ORAL | Status: DC
  Administered 2023-12-30 – 2023-12-31 (×2): 10 mg via ORAL
  Filled 2023-12-30 (×2): qty 1

## 2023-12-30 MED ORDER — TRAMADOL HCL 50 MG PO TABS
100.0000 mg | ORAL_TABLET | Freq: Two times a day (BID) | ORAL | Status: DC | PRN
  Administered 2023-12-30: 100 mg via ORAL
  Filled 2023-12-30: qty 2

## 2023-12-30 MED ORDER — ALBUTEROL SULFATE (2.5 MG/3ML) 0.083% IN NEBU: 2.5000 mg | INHALATION_SOLUTION | Freq: Four times a day (QID) | RESPIRATORY_TRACT | Status: DC | PRN

## 2023-12-30 MED ORDER — IOHEXOL 350 MG/ML SOLN
75.0000 mL | Freq: Once | INTRAVENOUS | Status: AC | PRN
  Administered 2023-12-30: 75 mL via INTRAVENOUS

## 2023-12-30 MED ORDER — AMLODIPINE BESYLATE 2.5 MG PO TABS
2.5000 mg | ORAL_TABLET | Freq: Every day | ORAL | Status: DC
Start: 2023-12-30 — End: 2023-12-31
  Administered 2023-12-30 – 2023-12-31 (×2): 2.5 mg via ORAL
  Filled 2023-12-30 (×2): qty 1

## 2023-12-30 MED ORDER — GABAPENTIN 300 MG PO CAPS
300.0000 mg | ORAL_CAPSULE | Freq: Every day | ORAL | Status: DC
  Administered 2023-12-30: 300 mg via ORAL
  Filled 2023-12-30: qty 1

## 2023-12-30 MED ORDER — ARGATROBAN 50 MG/50ML IV SOLN
1.2000 ug/kg/min | INTRAVENOUS | Status: DC
  Administered 2023-12-30: 1 ug/kg/min via INTRAVENOUS
  Filled 2023-12-30 (×2): qty 50

## 2023-12-30 MED ORDER — ASPIRIN 81 MG PO TBEC
81.0000 mg | DELAYED_RELEASE_TABLET | Freq: Every day | ORAL | Status: DC
  Administered 2023-12-30 – 2023-12-31 (×2): 81 mg via ORAL
  Filled 2023-12-30 (×2): qty 1

## 2023-12-30 MED ORDER — ONDANSETRON HCL 4 MG/2ML IJ SOLN
4.0000 mg | Freq: Four times a day (QID) | INTRAMUSCULAR | Status: DC | PRN
  Administered 2023-12-30: 4 mg via INTRAVENOUS
  Filled 2023-12-30: qty 2

## 2023-12-30 NOTE — Progress Notes (Signed)
[facility] PROGRESS NOTE    Progress Note  Courtney Steele  GNF:[id number] DOB: April 07, 1951 DOA: 12/29/2023 PCP: Maurice Small, MD (Inactive)     Brief Narrative:   Courtney Steele is an 73 y.o. female past medical history of CAD status post PCI, essential hypertension diabetes mellitus type 2, chronic low back pain came into the ED with 2 to 3 days of exertional dyspnea and chest pain, D-dimer was positive CT angio of the chest was done that showed right-sided and lower lobe PE, tropes are negative.   Assessment/Plan:   Acute pulmonary embolism (HCC) Presently hemodynamically stable. Transition to oral Eliquis. 2D echo is pending. She is requiring no oxygen not tachycardic blood pressure is 140s.  CAD S/P LAD DES 2012 No aspirin, beta-blocker, Imdur and Ranexa.  Essential hypertension Blood pressures currently stable continue amlodipine beta-blocker and Imdur.  Dyslipidemia, goal LDL below 70: Continue statins.  Diabetes mellitus type 2, controlled (HCC) Jardiance at home, A1c was sent. Blood glucose 110.  DVT prophylaxis: Eliquis Family Communication: None Status is: Observation The patient remains OBS appropriate and will d/c before 2 midnights.    Code Status:     Code Status Orders  (From admission, onward)           Start     Ordered   12/30/23 0539  Full code  Continuous       Question:  By:  Answer:  Consent: discussion documented in EHR   12/30/23 0539           Code Status History     Date Active Date Inactive Code Status Order ID Comments User Context   10/11/2022 1646 10/12/2022 1943 Full Code [id number]  Samson Frederic, MD Inpatient   10/31/2015 1435 10/31/2015 2330 Full Code [id number]  [place], Peter M, MD Inpatient   10/28/2015 1836 10/31/2015 1435 Full Code [id number]  Ellsworth Lennox, PA Inpatient   02/22/2012 2046 02/23/2012 1854 Full Code [id number]  Cassell Clement Inpatient         IV Access:   Peripheral  IV   Procedures and diagnostic studies:   CT Angio Chest PE W and/or Wo Contrast Result Date: 12/30/2023 CLINICAL DATA:  Pulmonary embolism (PE) suspected, low to intermediate prob, positive D-dimer EXAM: CT ANGIOGRAPHY CHEST WITH CONTRAST TECHNIQUE: Multidetector CT imaging of the chest was performed using the standard protocol during bolus administration of intravenous contrast. Multiplanar CT image reconstructions and MIPs were obtained to evaluate the vascular anatomy. RADIATION DOSE REDUCTION: This exam was performed according to the departmental dose-optimization program which includes automated exposure control, adjustment of the mA and/or kV according to patient size and/or use of iterative reconstruction technique. CONTRAST:  75mL OMNIPAQUE IOHEXOL 350 MG/ML SOLN COMPARISON:  11/26/2020 FINDINGS: Cardiovascular: Occlusive filling defect in the right lower lobe pulmonary artery and right upper lobe branches compatible with pulmonary emboli. No filling defects on the left. No evidence of right heart strain. Heart is normal size. Aorta is normal caliber. Moderate calcifications in the right coronary and left anterior descending coronary arteries. Aortic atherosclerosis. No aneurysm. Mediastinum/Nodes: No mediastinal, hilar, or axillary adenopathy. Trachea and esophagus are unremarkable. Thyroid unremarkable. Lungs/Pleura: No confluent airspace opacities or effusions. Minimal ground-glass densities in the lung bases, likely atelectasis. Upper Abdomen: No acute findings Musculoskeletal: Chest wall soft tissues are unremarkable. No acute bony abnormality. Review of the MIP images confirms the above findings. IMPRESSION: Filling defects in the right pulmonary arteries most pronounced in the right lower lobe but also  noted in the right upper lobe compatible with pulmonary emboli. No evidence of right heart strain. Moderate coronary artery disease. Aortic Atherosclerosis (ICD10-I70.0). These results were  called by telephone at the time of interpretation on 12/30/2023 at 1:04 am to provider Victory Dakin , who verbally acknowledged these results. Electronically Signed   By: Charlett Nose M.D.   On: 12/30/2023 01:06   DG Chest 2 View Result Date: 12/29/2023 CLINICAL DATA:  Chest pain. EXAM: CHEST - 2 VIEW COMPARISON:  Chest radiograph dated 11/26/2020. FINDINGS: The heart size and mediastinal contours are within normal limits. Vascular calcifications are seen in the aortic arch. Both lungs are clear. Degenerative changes are seen in the spine. IMPRESSION: No active cardiopulmonary disease. Electronically Signed   By: Romona Curls M.D.   On: 12/29/2023 19:45     Medical Consultants:   None.   Subjective:    Courtney Steele relates her shortness of breath is improved no calf pain.  Objective:    Vitals:   12/30/23 0400 12/30/23 0505 12/30/23 0650 12/30/23 0654  BP: (!) 151/70 (!) 121/98 (!) 128/59   Pulse: 78 80 76   Resp: 12 (!) 22 15   Temp:    98 F (36.7 C)  TempSrc:    Oral  SpO2: 94% 90% 95%   Weight:      Height:       SpO2: 95 %  No intake or output data in the 24 hours ending 12/30/23 0834 Filed Weights   12/29/23 1916  Weight: 99.8 kg    Exam: General exam: In no acute distress. Respiratory system: Good air movement and clear to auscultation. Cardiovascular system: S1 & S2 heard, RRR. No JVD. Gastrointestinal system: Abdomen is nondistended, soft and nontender.  Extremities: No pedal edema. Skin: No rashes, lesions or ulcers Psychiatry: Judgement and insight appear normal. Mood & affect appropriate.    Data Reviewed:    Labs: Basic Metabolic Panel: Recent Labs  Lab 12/29/23 1929 12/30/23 0630  NA 142 132*  K 3.7 3.6  CL 103 99  CO2 28 24  GLUCOSE 121* 110*  BUN 19 15  CREATININE 0.75 0.66  CALCIUM 9.3 8.3*   GFR Estimated Creatinine Clearance: 71.6 mL/min (by C-G formula based on SCr of 0.66 mg/dL). Liver Function Tests: Recent Labs  Lab  12/29/23 2123  AST 20  ALT 17  ALKPHOS 40  BILITOT 0.8  PROT 6.1*  ALBUMIN 3.3*   No results for input(s): "LIPASE", "AMYLASE" in the last 168 hours. No results for input(s): "AMMONIA" in the last 168 hours. Coagulation profile No results for input(s): "INR", "PROTIME" in the last 168 hours. COVID-19 Labs  Recent Labs    12/29/23 2123  DDIMER 1.68*    No results found for: "SARSCOV2NAA"  CBC: Recent Labs  Lab 12/29/23 1929 12/30/23 0630  WBC 5.6 6.0  NEUTROABS  --  2.7  HGB 14.1 13.2  HCT 42.5 40.1  MCV 97.3 97.6  PLT 204 177   Cardiac Enzymes: No results for input(s): "CKTOTAL", "CKMB", "CKMBINDEX", "TROPONINI" in the last 168 hours. BNP (last 3 results) No results for input(s): "PROBNP" in the last 8760 hours. CBG: No results for input(s): "GLUCAP" in the last 168 hours. D-Dimer: Recent Labs    12/29/23 2123  DDIMER 1.68*   Hgb A1c: No results for input(s): "HGBA1C" in the last 72 hours. Lipid Profile: No results for input(s): "CHOL", "HDL", "LDLCALC", "TRIG", "CHOLHDL", "LDLDIRECT" in the last 72 hours. Thyroid function studies: No results  for input(s): "TSH", "T4TOTAL", "T3FREE", "THYROIDAB" in the last 72 hours.  Invalid input(s): "FREET3" Anemia work up: No results for input(s): "VITAMINB12", "FOLATE", "FERRITIN", "TIBC", "IRON", "RETICCTPCT" in the last 72 hours. Sepsis Labs: Recent Labs  Lab 12/29/23 1929 12/30/23 0630  WBC 5.6 6.0   Microbiology No results found for this or any previous visit (from the past 240 hours).   Medications:    amLODipine  2.5 mg Oral Daily   aspirin  325 mg Oral Daily   atorvastatin  40 mg Oral Daily   Calcium Carb-Cholecalciferol   Oral Daily   DULoxetine  90 mg Oral Daily   ezetimibe  10 mg Oral Daily   gabapentin  300 mg Oral QHS   isosorbide mononitrate  30 mg Oral Daily   melatonin  3 mg Oral QHS   metoprolol succinate  25 mg Oral Daily   omega-3 acid ethyl esters  4 g Oral Daily   pantoprazole   40 mg Oral Daily   ranolazine  1,000 mg Oral BID   Continuous Infusions:  argatroban 1.2 mcg/kg/min (12/30/23 0752)      LOS: 0 days   Marinda Elk  [facility]  12/30/2023, 8:34 AM

## 2023-12-30 NOTE — ED Notes (Signed)
Report received from vascular.

## 2023-12-30 NOTE — Progress Notes (Signed)
PHARMACY - ANTICOAGULATION CONSULT NOTE  Pharmacy Consult for argatroban Indication: pulmonary embolus  Allergies  Allergen Reactions   Alpha-Gal Anaphylaxis   Beef-Derived Drug Products Anaphylaxis   Other Anaphylaxis    No mammal protein substances   Benazepril Cough   Erythromycin Diarrhea   Indomethacin Diarrhea   Lisinopril Cough   Biaxin [Clarithromycin] Rash   Clarithromycin Rash   Penicillins Rash    Patient Measurements: Height: 5\' 3"  (160 cm) Weight: 99.8 kg (220 lb) IBW/kg (Calculated) : 52.4  Vital Signs: Temp: 98.7 F (37.1 C) (01/19 1853) Temp Source: Oral (01/19 1841) BP: 151/58 (01/19 2245) Pulse Rate: 82 (01/19 2245)  Labs: Recent Labs    12/29/23 1929 12/29/23 2123  HGB 14.1  --   HCT 42.5  --   PLT 204  --   CREATININE 0.75  --   TROPONINIHS 8 7    Estimated Creatinine Clearance: 71.6 mL/min (by C-G formula based on SCr of 0.75 mg/dL).   Medical History: Past Medical History:  Diagnosis Date   Borderline diabetes    CAD (coronary artery disease)    a. 2012: DES to LAD    COVID    Degenerative joint disease    Depression    Diabetes mellitus    Fibromyalgia    GERD (gastroesophageal reflux disease)    Groin hematoma    Hematoma    Radial catheter site July 13, 2011   Hyperlipidemia    Hypertension    Morbid obesity (HCC)    Osteoarthritis    Pneumonia     Assessment: 73yo female SOB x2d that worsens with exertion then developed chest pressure radiating to jaw, no relief with NTG PTA >> CT shows PE with no evidence of RHS, to begin anticoagulation; of note pt has alpha-gal allergy, using argatroban.  Goal of Therapy:  aPTT 50-90 seconds Monitor platelets by anticoagulation protocol: Yes   Plan:  Start argatroban at 1 mcg/kg/min. Monitor aPTT and CBC.  Vernard Gambles, PharmD, BCPS  12/30/2023,1:33 AM

## 2023-12-30 NOTE — ED Provider Notes (Signed)
Physical Exam  BP (!) 151/58   Pulse 82   Temp 98.7 F (37.1 C)   Resp 16   Ht 5\' 3"  (1.6 m)   Wt 99.8 kg   SpO2 95%   BMI 38.97 kg/m   Physical Exam Vitals and nursing note reviewed.  Constitutional:      General: She is not in acute distress.    Appearance: She is not ill-appearing or toxic-appearing.  Cardiovascular:     Rate and Rhythm: Normal rate.  Pulmonary:     Effort: Pulmonary effort is normal.     Breath sounds: No decreased breath sounds.  Musculoskeletal:     Right lower leg: No tenderness. Edema present.     Left lower leg: No tenderness. Edema present.     Comments: Legs are symmetric in size. Some trace edema. Compartments are soft.   Skin:    General: Skin is warm and dry.  Neurological:     Mental Status: She is alert.     Procedures  Procedures  ED Course / MDM    Medical Decision Making Amount and/or Complexity of Data Reviewed Labs: ordered. Radiology: ordered. ECG/medicine tests: ordered.  Risk Prescription drug management. Decision regarding hospitalization.   Accepted handoff at shift change from Fayrene Helper, PA-C. Please see prior provider note for more detail.   Briefly: Patient is 73 y.o. F presenting for evaluation of 3 days of dyspnea on exertion and started having some chest pain today.  She is currently chest pain-free but does have a heart score of 6.  She was also found to have an episode of hypoxia however was not complaining of any shortness of breath at the timeon 2 L.  Concern is for potential PE given elevated D-dimer.  Plan is to follow-up with CT angio.  CT Angio shows  filling defects in the right pulmonary arteries most pronounced in the right lower lobe but also noted in the right upper lobe compatible with pulmonary emboli. No evidence of right heart strain. Moderate coronary artery disease. Aortic Atherosclerosis.  Plan was to order heparin for the patient's PE however patient does have alpha gal syndrome.  Since  heparin's porcine derived, question if this is okay in the situation.  Patient does have anaphylaxis to beef products.  She reports that she is accidentally eaten bacon once and did not have a reaction.  She does not have to use special toothpaste and still is able to take packed pills.  I consulted for [facility] for potential recommendations given this.  The patient had a movement of the hypoxia with the previous shift although the patient was not feeling any shortness of breath and was otherwise in no acute distress.  She is satting at 100% on 2 L.  I trialed her off of it and she has been still satting at 100%.  She could have possible in sleeping with some possible OSA.  It was decided to start the patient on argatroban given potential for reaction with the heparin, per pharmacy recommendations. She is aware of her results and agrees with admission. Patient will be admitted to the hospital service.   Results for orders placed or performed during the hospital encounter of 12/29/23  Basic metabolic panel   Collection Time: 12/29/23  7:29 PM  Result Value Ref Range   Sodium 142 135 - 145 mmol/L   Potassium 3.7 3.5 - 5.1 mmol/L   Chloride 103 98 - 111 mmol/L   CO2 28 22 - 32  mmol/L   Glucose, Bld 121 (H) 70 - 99 mg/dL   BUN 19 8 - 23 mg/dL   Creatinine, Ser 0.98 0.44 - 1.00 mg/dL   Calcium 9.3 8.9 - 11.9 mg/dL   GFR, Estimated >14 >78 mL/min   Anion gap 11 5 - 15  CBC   Collection Time: 12/29/23  7:29 PM  Result Value Ref Range   WBC 5.6 4.0 - 10.5 K/uL   RBC 4.37 3.87 - 5.11 MIL/uL   Hemoglobin 14.1 12.0 - 15.0 g/dL   HCT 29.5 62.1 - 30.8 %   MCV 97.3 80.0 - 100.0 fL   MCH 32.3 26.0 - 34.0 pg   MCHC 33.2 30.0 - 36.0 g/dL   RDW 65.7 84.6 - 96.2 %   Platelets 204 150 - 400 K/uL   nRBC 0.0 0.0 - 0.2 %  Troponin I (High Sensitivity)   Collection Time: 12/29/23  7:29 PM  Result Value Ref Range   Troponin I (High Sensitivity) 8 <18 ng/L  D-dimer, quantitative   Collection Time:  12/29/23  9:23 PM  Result Value Ref Range   D-Dimer, Quant 1.68 (H) 0.00 - 0.50 ug/mL-FEU  Troponin I (High Sensitivity)   Collection Time: 12/29/23  9:23 PM  Result Value Ref Range   Troponin I (High Sensitivity) 7 <18 ng/L   CT Angio Chest PE W and/or Wo Contrast Result Date: 12/30/2023 CLINICAL DATA:  Pulmonary embolism (PE) suspected, low to intermediate prob, positive D-dimer EXAM: CT ANGIOGRAPHY CHEST WITH CONTRAST TECHNIQUE: Multidetector CT imaging of the chest was performed using the standard protocol during bolus administration of intravenous contrast. Multiplanar CT image reconstructions and MIPs were obtained to evaluate the vascular anatomy. RADIATION DOSE REDUCTION: This exam was performed according to the departmental dose-optimization program which includes automated exposure control, adjustment of the mA and/or kV according to patient size and/or use of iterative reconstruction technique. CONTRAST:  75mL OMNIPAQUE IOHEXOL 350 MG/ML SOLN COMPARISON:  11/26/2020 FINDINGS: Cardiovascular: Occlusive filling defect in the right lower lobe pulmonary artery and right upper lobe branches compatible with pulmonary emboli. No filling defects on the left. No evidence of right heart strain. Heart is normal size. Aorta is normal caliber. Moderate calcifications in the right coronary and left anterior descending coronary arteries. Aortic atherosclerosis. No aneurysm. Mediastinum/Nodes: No mediastinal, hilar, or axillary adenopathy. Trachea and esophagus are unremarkable. Thyroid unremarkable. Lungs/Pleura: No confluent airspace opacities or effusions. Minimal ground-glass densities in the lung bases, likely atelectasis. Upper Abdomen: No acute findings Musculoskeletal: Chest wall soft tissues are unremarkable. No acute bony abnormality. Review of the MIP images confirms the above findings. IMPRESSION: Filling defects in the right pulmonary arteries most pronounced in the right lower lobe but also noted  in the right upper lobe compatible with pulmonary emboli. No evidence of right heart strain. Moderate coronary artery disease. Aortic Atherosclerosis (ICD10-I70.0). These results were called by telephone at the time of interpretation on 12/30/2023 at 1:04 am to provider Victory Dakin , who verbally acknowledged these results. Electronically Signed   By: Charlett Nose M.D.   On: 12/30/2023 01:06   DG Chest 2 View Result Date: 12/29/2023 CLINICAL DATA:  Chest pain. EXAM: CHEST - 2 VIEW COMPARISON:  Chest radiograph dated 11/26/2020. FINDINGS: The heart size and mediastinal contours are within normal limits. Vascular calcifications are seen in the aortic arch. Both lungs are clear. Degenerative changes are seen in the spine. IMPRESSION: No active cardiopulmonary disease. Electronically Signed   By: Romona Curls M.D.   On:  12/29/2023 19:45          Achille Rich, PA-C 12/30/23 0256    Marily Memos, MD 12/30/23 [phone]

## 2023-12-30 NOTE — H&P (Addendum)
History and Physical    Courtney Steele WJX:[id number] DOB: 01-03-1951 DOA: 12/29/2023  Patient coming from: Home.  Chief Complaint: Shortness of breath and chest pressure.  HPI: Courtney Steele is a 73 y.o. female with history of CAD status post PCI, hypertension, diabetes mellitus type 2, chronic low back pain, GERD presents to the ER with 2 to 3 days of exertional dyspnea and chest pressure.  Denies any productive cough fever chills.  No recent long distance travel or surgery.  No prior history of PE or DVT or any family history of PE.  Last few days patient has been noticing increasing lower extremity edema.  ED Course: In the ER D-dimer was positive and CT angiogram of chest was done which shows right-sided upper lobe and lower lobe filling defects concerning for pulmonary embolism.  No strain pattern.  Troponin was negative.  Since patient has alpha gal allergies patient was started on argatroban and admitted for further observation.  Review of Systems: As per HPI, rest all negative.   Past Medical History:  Diagnosis Date   Borderline diabetes    CAD (coronary artery disease)    a. 2012: DES to LAD    COVID    Degenerative joint disease    Depression    Diabetes mellitus    Fibromyalgia    GERD (gastroesophageal reflux disease)    Groin hematoma    Hematoma    Radial catheter site July 13, 2011   Hyperlipidemia    Hypertension    Morbid obesity (HCC)    Osteoarthritis    Pneumonia     Past Surgical History:  Procedure Laterality Date   ABDOMINAL HYSTERECTOMY     ANGIOPLASTY     BUNIONECTOMY     CARDIAC CATHETERIZATION     revealing patency of the LAD stent with mild compromise of  the diagonal and moderate mid LAD stenosis, but does not appear to  be high grade.    CARDIAC CATHETERIZATION N/A 10/31/2015   Procedure: Left Heart Cath and Coronary Angiography;  Surgeon: Peter M Sw[place], MD;  Location: [facility] INVASIVE CV LAB;  Service: Cardiovascular;  Laterality: N/A;    CARPAL TUNNEL RELEASE     CATARACT EXTRACTION     CHOLECYSTECTOMY     KNEE ARTHROPLASTY Right 10/11/2022   Procedure: COMPUTER ASSISTED TOTAL KNEE ARTHROPLASTY;  Surgeon: Samson Frederic, MD;  Location: [facility] ORS;  Service: Orthopedics;  Laterality: Right;  150   KNEE ARTHROSCOPY     right ulnar nerve transposition      TONSILLECTOMY       reports that she has never smoked. She has never used smokeless tobacco. She reports current alcohol use. She reports that she does not use drugs.  Allergies  Allergen Reactions   Alpha-Gal Anaphylaxis   Beef-Derived Drug Products Anaphylaxis   Other Anaphylaxis    No mammal protein substances   Benazepril Cough   Erythromycin Diarrhea   Indomethacin Diarrhea   Lisinopril Cough   Biaxin [Clarithromycin] Rash   Penicillins Rash    Family History  Problem Relation Age of Onset   Other Mother        died pulmonary issues   Coronary artery disease Father 79       died- CABG-HTN- AAA   Breast cancer Maternal Aunt        >50   Breast cancer Cousin 30    Prior to Admission medications   Medication Sig Start Date End Date Taking? Authorizing Provider  acetaminophen (TYLENOL)  500 MG tablet Take 500 mg by mouth every 6 (six) hours as needed for moderate pain.   Yes [provider]  albuterol (PROVENTIL HFA;VENTOLIN HFA) 108 (90 BASE) MCG/ACT inhaler Inhale 1-2 puffs into the lungs every 6 (six) hours as needed for wheezing or shortness of breath. 05/10/15  Yes Mirian Mo, MD  amLODipine (NORVASC) 2.5 MG tablet TAKE 1 TABLET(2.5 MG) BY MOUTH DAILY 12/19/23  Yes [place], Peter M, MD  atorvastatin (LIPITOR) 40 MG tablet Take 1 tablet (40 mg total) by mouth daily. 02/25/23  Yes Monge, Petra Kuba, NP  Calcium Carb-Cholecalciferol (CALCIUM + VITAMIN D3 PO) Take 1 tablet by mouth daily.   Yes [provider]  cyclobenzaprine (FLEXERIL) 5 MG tablet Take 5-10 mg by mouth 3 (three) times daily as needed for muscle spasms.   Yes [provider]  diphenhydrAMINE (BENADRYL) 25 MG tablet Take 1 tablet (25 mg total) by mouth every 6 (six) hours for 5 days. 07/18/21 12/29/24 Yes Cardama, Amadeo Garnet, MD  DULoxetine (CYMBALTA) 30 MG capsule Take 90 mg by mouth daily. 08/18/18  Yes [provider]  empagliflozin (JARDIANCE) 10 MG TABS tablet Take 10 mg by mouth daily.   Yes [provider]  EPINEPHrine 0.3 mg/0.3 mL IJ SOAJ injection Inject 0.3 mg into the muscle as needed for anaphylaxis. 09/22/22  Yes Long, Arlyss Repress, MD  ezetimibe (ZETIA) 10 MG tablet Take 1 tablet (10 mg total) by mouth daily. 02/25/23  Yes Monge, Petra Kuba, NP  gabapentin (NEURONTIN) 100 MG capsule Take 300 mg by mouth at bedtime. 08/07/22  Yes [provider]  isosorbide mononitrate (IMDUR) 30 MG 24 hr tablet TAKE 1 TABLET(30 MG) BY MOUTH DAILY 06/24/23  Yes [place], Peter M, MD  MELATONIN PO Take 1 tablet by mouth daily.   Yes [provider]  metoprolol succinate (TOPROL-XL) 25 MG 24 hr tablet TAKE 1 TABLET BY MOUTH DAILY 03/29/23  Yes [place], Peter M, MD  nitroGLYCERIN (NITROSTAT) 0.4 MG SL tablet Place 1 tablet (0.4 mg total) under the tongue every 5 (five) minutes as needed. For chest pain 12/22/20  Yes [place], Peter M, MD  omega-3 acid ethyl esters (LOVAZA) 1 g capsule Take 4 capsules (4 g total) by mouth daily. 03/20/23 03/18/24 Yes [place], Peter M, MD  pantoprazole (PROTONIX) 40 MG tablet TAKE 1 TABLET(40 MG) BY MOUTH DAILY 05/16/23  Yes [place], Peter M, MD  ranolazine (RANEXA) 1000 MG SR tablet TAKE 1 TABLET(1000 MG) BY MOUTH TWICE DAILY 01/28/23  Yes [place], Peter M, MD  traMADol (ULTRAM) 50 MG tablet Take 100 mg by mouth in the morning and at bedtime.   Yes [provider]  TURMERIC PO Take 1 capsule by mouth daily.   Yes [provider]    Physical Exam: Constitutional: Moderately built and nourished. Vitals:   12/29/23 2245 12/30/23 0015 12/30/23 0219 12/30/23 0400  BP: (!) 151/58 126/69  (!) 151/70   Pulse: 82 71  78  Resp: 16 16  12   Temp:   98.2 F (36.8 C)   TempSrc:   Oral   SpO2: 95% 100%  94%  Weight:      Height:       Eyes: Anicteric no pallor. ENMT: No discharge from the ears eyes nose or mouth. Neck: No mass felt.  No neck rigidity. Respiratory: No rhonchi or crepitations. Cardiovascular: S1-S2 heard. Abdomen: Soft nontender bowel sounds present. Musculoskeletal: Mild edema of the lower extremities. Skin: No rash. Neurologic: Alert awake  oriented to time place and person.  Moves all extremities. Psychiatric: Appears normal.  Normal affect.   Labs on Admission: I have personally reviewed following labs and imaging studies  CBC: Recent Labs  Lab 12/29/23 1929  WBC 5.6  HGB 14.1  HCT 42.5  MCV 97.3  PLT 204   Basic Metabolic Panel: Recent Labs  Lab 12/29/23 1929  NA 142  K 3.7  CL 103  CO2 28  GLUCOSE 121*  BUN 19  CREATININE 0.75  CALCIUM 9.3   GFR: Estimated Creatinine Clearance: 71.6 mL/min (by C-G formula based on SCr of 0.75 mg/dL). Liver Function Tests: Recent Labs  Lab 12/29/23 2123  AST 20  ALT 17  ALKPHOS 40  BILITOT 0.8  PROT 6.1*  ALBUMIN 3.3*   No results for input(s): "LIPASE", "AMYLASE" in the last 168 hours. No results for input(s): "AMMONIA" in the last 168 hours. Coagulation Profile: No results for input(s): "INR", "PROTIME" in the last 168 hours. Cardiac Enzymes: No results for input(s): "CKTOTAL", "CKMB", "CKMBINDEX", "TROPONINI" in the last 168 hours. BNP (last 3 results) No results for input(s): "PROBNP" in the last 8760 hours. HbA1C: No results for input(s): "HGBA1C" in the last 72 hours. CBG: No results for input(s): "GLUCAP" in the last 168 hours. Lipid Profile: No results for input(s): "CHOL", "HDL", "LDLCALC", "TRIG", "CHOLHDL", "LDLDIRECT" in the last 72 hours. Thyroid Function Tests: No results for input(s): "TSH", "T4TOTAL", "FREET4", "T3FREE", "THYROIDAB" in the last 72 hours. Anemia Panel: No  results for input(s): "VITAMINB12", "FOLATE", "FERRITIN", "TIBC", "IRON", "RETICCTPCT" in the last 72 hours. Urine analysis:    Component Value Date/Time   COLORURINE YELLOW 07/24/2016 2344   APPEARANCEUR CLEAR 07/24/2016 2344   LABSPEC 1.024 07/24/2016 2344   PHURINE 6.0 07/24/2016 2344   GLUCOSEU NEGATIVE 07/24/2016 2344   HGBUR NEGATIVE 07/24/2016 2344   BILIRUBINUR NEGATIVE 07/24/2016 2344   KETONESUR NEGATIVE 07/24/2016 2344   PROTEINUR NEGATIVE 07/24/2016 2344   UROBILINOGEN 0.2 11/22/2012 1624   NITRITE NEGATIVE 07/24/2016 2344   LEUKOCYTESUR TRACE (A) 07/24/2016 2344   Sepsis Labs: @LABRCNTIP (procalcitonin:4,lacticidven:4) )No results found for this or any previous visit (from the past 240 hours).   Radiological Exams on Admission: CT Angio Chest PE W and/or Wo Contrast Result Date: 12/30/2023 CLINICAL DATA:  Pulmonary embolism (PE) suspected, low to intermediate prob, positive D-dimer EXAM: CT ANGIOGRAPHY CHEST WITH CONTRAST TECHNIQUE: Multidetector CT imaging of the chest was performed using the standard protocol during bolus administration of intravenous contrast. Multiplanar CT image reconstructions and MIPs were obtained to evaluate the vascular anatomy. RADIATION DOSE REDUCTION: This exam was performed according to the departmental dose-optimization program which includes automated exposure control, adjustment of the mA and/or kV according to patient size and/or use of iterative reconstruction technique. CONTRAST:  75mL OMNIPAQUE IOHEXOL 350 MG/ML SOLN COMPARISON:  11/26/2020 FINDINGS: Cardiovascular: Occlusive filling defect in the right lower lobe pulmonary artery and right upper lobe branches compatible with pulmonary emboli. No filling defects on the left. No evidence of right heart strain. Heart is normal size. Aorta is normal caliber. Moderate calcifications in the right coronary and left anterior descending coronary arteries. Aortic atherosclerosis. No aneurysm.  Mediastinum/Nodes: No mediastinal, hilar, or axillary adenopathy. Trachea and esophagus are unremarkable. Thyroid unremarkable. Lungs/Pleura: No confluent airspace opacities or effusions. Minimal ground-glass densities in the lung bases, likely atelectasis. Upper Abdomen: No acute findings Musculoskeletal: Chest wall soft tissues are unremarkable. No acute bony abnormality. Review of the MIP images confirms the above findings. IMPRESSION: Filling defects  in the right pulmonary arteries most pronounced in the right lower lobe but also noted in the right upper lobe compatible with pulmonary emboli. No evidence of right heart strain. Moderate coronary artery disease. Aortic Atherosclerosis (ICD10-I70.0). These results were called by telephone at the time of interpretation on 12/30/2023 at 1:04 am to provider Victory Dakin , who verbally acknowledged these results. Electronically Signed   By: Charlett Nose M.D.   On: 12/30/2023 01:06   DG Chest 2 View Result Date: 12/29/2023 CLINICAL DATA:  Chest pain. EXAM: CHEST - 2 VIEW COMPARISON:  Chest radiograph dated 11/26/2020. FINDINGS: The heart size and mediastinal contours are within normal limits. Vascular calcifications are seen in the aortic arch. Both lungs are clear. Degenerative changes are seen in the spine. IMPRESSION: No active cardiopulmonary disease. Electronically Signed   By: Romona Curls M.D.   On: 12/29/2023 19:45    EKG: Independently reviewed.  Normal sinus rhythm.  Probable RVH.  Assessment/Plan Principal Problem:   Pulmonary embolism (HCC) Active Problems:   CAD S/P LAD DES 2012   Essential hypertension   Dyslipidemia, goal LDL below 70   Diabetes mellitus type 2, controlled (HCC)   Acute pulmonary embolism (HCC)    Pulmonary embolism unprovoked presently hemodynamically stable since patient has alpha gal allergies patient is placed on argatroban.  If continues to remain stable and if 2D echo does not show any strain pattern will change to  Eliquis. CAD status post PCI troponins are negative.  Takes aspirin statins beta-blockers Imdur and Ranexa. Diabetes mellitus type 2 takes Jardiance.  No recent hemoglobin A1c in the system.  Will keep patient on sliding scale coverage. Hypertension on amlodipine beta-blockers and Imdur. Fibromyalgia on tramadol Flexeril and gabapentin.  Since patient has pulmonary embolism will need close monitoring and more than 2 midnight stay.   DVT prophylaxis: Heparin infusion. Code Status: Full code. Family Communication: Discussed with patient. Disposition Plan: Monitored bed. Consults called: None. Admission status: Observation.

## 2023-12-30 NOTE — Progress Notes (Signed)
*  PRELIMINARY RESULTS* Bilateral lower venous duplex has been completed.  Preliminary findings: Right lower extremity no DVT or SVT; Left lower extremity acute DVT popliteal vein.   Laddie Aquas 12/30/2023, 12:17 PM

## 2023-12-30 NOTE — ED Notes (Addendum)
Pt to vascular, pt well appearing upon transport.

## 2023-12-30 NOTE — ED Notes (Signed)
Patient transported to CT 

## 2023-12-30 NOTE — ED Notes (Signed)
Pt up to bathroom with stable gait. 

## 2023-12-30 NOTE — Progress Notes (Addendum)
PHARMACY - ANTICOAGULATION CONSULT NOTE  Pharmacy Consult for argatroban Indication: pulmonary embolus  Allergies  Allergen Reactions   Alpha-Gal Anaphylaxis   Beef-Derived Drug Products Anaphylaxis   Other Anaphylaxis    No mammal protein substances   Benazepril Cough   Erythromycin Diarrhea   Indomethacin Diarrhea   Lisinopril Cough   Biaxin [Clarithromycin] Rash   Penicillins Rash    Patient Measurements: Height: 5\' 3"  (160 cm) Weight: 99.8 kg (220 lb) IBW/kg (Calculated) : 52.4  Vital Signs: Temp: 98 F (36.7 C) (01/20 0654) Temp Source: Oral (01/20 0654) BP: 128/59 (01/20 0650) Pulse Rate: 76 (01/20 0650)  Labs: Recent Labs    12/29/23 1929 12/29/23 2123 12/30/23 0630  HGB 14.1  --  13.2  HCT 42.5  --  40.1  PLT 204  --  177  APTT  --   --  42*  CREATININE 0.75  --  0.66  TROPONINIHS 8 7  --     Estimated Creatinine Clearance: 71.6 mL/min (by C-G formula based on SCr of 0.66 mg/dL).   Medical History: Past Medical History:  Diagnosis Date   Borderline diabetes    CAD (coronary artery disease)    a. 2012: DES to LAD    COVID    Degenerative joint disease    Depression    Diabetes mellitus    Fibromyalgia    GERD (gastroesophageal reflux disease)    Groin hematoma    Hematoma    Radial catheter site July 13, 2011   Hyperlipidemia    Hypertension    Morbid obesity (HCC)    Osteoarthritis    Pneumonia     Assessment: 72yo female SOB x2d that worsens with exertion then developed chest pressure radiating to jaw, no relief with NTG PTA >> CT shows PE with no evidence of RHS, to begin anticoagulation; of note pt has alpha-gal allergy, using argatroban.  Initial aPTT subtherapeutic: 42 seconds, no issues with infusion or overt s/sx of bleeding. Will increase 20% based on protocol.   Goal of Therapy:  aPTT 50-90 seconds Monitor platelets by anticoagulation protocol: Yes   Plan:  Increase argatroban to 1.2 mcg/kg/min. Monitor 4 hour aPTT and  CBC.  Ruben Im, PharmD Clinical Pharmacist 12/30/2023 7:48 AM Please check AMION for all [facility] Pharmacy numbers    Addendum:  Pharmacy consulted to stop argatroban and transition to oral anticoagulation with Eliquis. Will also reduce aspirin 325 to 81mg  once daily.  Stop argatroban Start Eliquis 10mg  BID x7 days then 5mg  BID  Ruben Im, PharmD Clinical Pharmacist 12/30/2023 8:41 AM Please check AMION for all [facility] Pharmacy numbers

## 2023-12-31 ENCOUNTER — Other Ambulatory Visit (HOSPITAL_COMMUNITY): Payer: Self-pay

## 2023-12-31 ENCOUNTER — Telehealth (HOSPITAL_COMMUNITY): Payer: Self-pay

## 2023-12-31 DIAGNOSIS — I2699 Other pulmonary embolism without acute cor pulmonale: Secondary | ICD-10-CM | POA: Diagnosis not present

## 2023-12-31 LAB — CBC
HCT: 39.4 % (ref 36.0–46.0)
Hemoglobin: 13 g/dL (ref 12.0–15.0)
MCH: 31.9 pg (ref 26.0–34.0)
MCHC: 33 g/dL (ref 30.0–36.0)
MCV: 96.8 fL (ref 80.0–100.0)
Platelets: 178 10*3/uL (ref 150–400)
RBC: 4.07 MIL/uL (ref 3.87–5.11)
RDW: 13.2 % (ref 11.5–15.5)
WBC: 5.2 10*3/uL (ref 4.0–10.5)
nRBC: 0 % (ref 0.0–0.2)

## 2023-12-31 MED ORDER — ASPIRIN 81 MG PO TBEC: 81.0000 mg | DELAYED_RELEASE_TABLET | Freq: Every day | ORAL | 12 refills | Status: DC

## 2023-12-31 MED ORDER — APIXABAN 5 MG PO TABS: ORAL_TABLET | ORAL | 2 refills | Status: AC

## 2023-12-31 NOTE — Telephone Encounter (Signed)
Pharmacy Patient Advocate Encounter  Insurance verification completed.    The patient is insured through [facility]. Patient has Medicare and is not eligible for a copay card, but may be able to apply for patient assistance or Medicare RX Payment Plan (Patient Must reach out to their plan, if eligible for payment plan), if available.    Ran test claim for Eliquis Starter Pack and the current 30 day co-pay is $47.00.   This test claim was processed through [facility]- copay amounts may vary at other pharmacies due to pharmacy/plan contracts, or as the patient moves through the different stages of their insurance plan.

## 2023-12-31 NOTE — Plan of Care (Signed)
  Problem: Pain Managment: Goal: General experience of comfort will improve and/or be controlled Outcome: Progressing   Problem: Safety: Goal: Ability to remain free from injury will improve Outcome: Progressing

## 2023-12-31 NOTE — Discharge Instructions (Signed)
Information on my medicine - ELIQUIS (apixaban)  This medication education was reviewed with me or my healthcare representative as part of my discharge preparation.  The pharmacist that spoke with me during my hospital stay was:  Titus Mould, [facility]  Why was Eliquis prescribed for you? Eliquis was prescribed to treat blood clots that may have been found in the veins of your legs (deep vein thrombosis) or in your lungs (pulmonary embolism) and to reduce the risk of them occurring again.  What do You need to know about Eliquis ? The starting dose is 10 mg (two 5 mg tablets) taken TWICE daily for the FIRST SEVEN (7) DAYS, then on 01/06/24  the dose is reduced to ONE 5 mg tablet taken TWICE daily.  Eliquis may be taken with or without food.   Try to take the dose about the same time in the morning and in the evening. If you have difficulty swallowing the tablet whole please discuss with your pharmacist how to take the medication safely.  Take Eliquis exactly as prescribed and DO NOT stop taking Eliquis without talking to the doctor who prescribed the medication.  Stopping may increase your risk of developing a new blood clot.  Refill your prescription before you run out.  After discharge, you should have regular check-up appointments with your healthcare provider that is prescribing your Eliquis.    What do you do if you miss a dose? If a dose of ELIQUIS is not taken at the scheduled time, take it as soon as possible on the same day and twice-daily administration should be resumed. The dose should not be doubled to make up for a missed dose.  Important Safety Information A possible side effect of Eliquis is bleeding. You should call your healthcare provider right away if you experience any of the following: Bleeding from an injury or your nose that does not stop. Unusual colored urine (red or dark brown) or unusual colored stools (red or black). Unusual bruising for unknown reasons. A  serious fall or if you hit your head (even if there is no bleeding).  Some medicines may interact with Eliquis and might increase your risk of bleeding or clotting while on Eliquis. To help avoid this, consult your healthcare provider or pharmacist prior to using any new prescription or non-prescription medications, including herbals, vitamins, non-steroidal anti-inflammatory drugs (NSAIDs) and supplements.  This website has more information on Eliquis (apixaban): http://www.eliquis.com/eliquis/home

## 2023-12-31 NOTE — Progress Notes (Signed)
Reviewed AVS, patient expressed understanding of medications.  Removed IV, Site clean, dry and intact.  Patient informed where to pick up prescriptions. Pt transported to Entrance A where family member was waiting in vehicle to transport home.

## 2023-12-31 NOTE — Discharge Summary (Addendum)
Physician Discharge Summary  Courtney Steele OZH:[id number] DOB: 08-16-1951 DOA: 12/29/2023  PCP: Maurice Small, MD (Inactive)  Admit date: 12/29/2023 Discharge date: 12/31/2023  Admitted From: Home Disposition:  home  Recommendations for Outpatient Follow-up:  Follow up with PCP in 1-2 weeks Please obtain BMP/CBC in one week Home Health:No Equipment/Devices:None  Discharge Condition:Stable CODE STATUS:Full Diet recommendation: Heart Healthy  Brief/Interim Summary:  73 y.o. female past medical history of CAD status post PCI, essential hypertension diabetes mellitus type 2, chronic low back pain came into the ED with 2 to 3 days of exertional dyspnea and chest pain, D-dimer was positive CT angio of the chest was done that showed right-sided and lower lobe PE, tropes are negative.   Discharge Diagnoses:  Principal Problem:   Pulmonary embolism (HCC) Active Problems:   CAD S/P LAD DES 2012   Essential hypertension   Dyslipidemia, goal LDL below 70   Fibromyalgia   Diabetes mellitus type 2, controlled (HCC)   Acute pulmonary embolism (HCC)  Acute pulmonary embolism: She was hemodynamically stable. She was started on Lovenox which was transitioned to oral Eliquis. 2D echo was done showed an EF of 75% no wall motion abnormality grade 1 diastolic dysfunction pulmonary pressures are preserved. Lower extremity Doppler was positive for left lower extremity DVT.  CAD status post DES to the LAD 2012 chronic change made to her medication continue aspirin beta-blockers Imdur and Ranexa.  Essential hypertension: No change made to her medication continue current regimen.  Dyslipidemia: Continue statin no changes made to his medication.  Diabetes mellitus type 2: At home she was on Jardiance, no changes made her blood glucose remained well-controlled house.  Morbid obesity: noted   Discharge Instructions  Discharge Instructions     Diet - low sodium heart healthy   Complete by:  As directed    Increase activity slowly   Complete by: As directed       Allergies as of 12/31/2023       Reactions   Alpha-gal Anaphylaxis   Beef-derived Drug Products Anaphylaxis   Other Anaphylaxis   No mammal protein substances   Benazepril Cough   Erythromycin Diarrhea   Indomethacin Diarrhea   Lisinopril Cough   Biaxin [clarithromycin] Rash   Penicillins Rash        Medication List     TAKE these medications    acetaminophen 500 MG tablet Commonly known as: TYLENOL Take 500 mg by mouth every 6 (six) hours as needed for moderate pain.   albuterol 108 (90 Base) MCG/ACT inhaler Commonly known as: VENTOLIN HFA Inhale 1-2 puffs into the lungs every 6 (six) hours as needed for wheezing or shortness of breath.   amLODipine 2.5 MG tablet Commonly known as: NORVASC TAKE 1 TABLET(2.5 MG) BY MOUTH DAILY   apixaban 5 MG Tabs tablet Commonly known as: ELIQUIS Take 2 tablets (10 mg total) by mouth 2 (two) times daily for 6 days, THEN 1 tablet (5 mg total) 2 (two) times daily. Start taking on: December 31, 2023   aspirin EC 81 MG tablet Take 1 tablet (81 mg total) by mouth daily. Swallow whole. Start taking on: January 01, 2024   atorvastatin 40 MG tablet Commonly known as: LIPITOR Take 1 tablet (40 mg total) by mouth daily.   CALCIUM + VITAMIN D3 PO Take 1 tablet by mouth daily.   cyclobenzaprine 5 MG tablet Commonly known as: FLEXERIL Take 5-10 mg by mouth 3 (three) times daily as needed for muscle spasms.  diphenhydrAMINE 25 MG tablet Commonly known as: BENADRYL Take 1 tablet (25 mg total) by mouth every 6 (six) hours for 5 days.   DULoxetine 30 MG capsule Commonly known as: CYMBALTA Take 90 mg by mouth daily.   empagliflozin 10 MG Tabs tablet Commonly known as: JARDIANCE Take 10 mg by mouth daily.   EPINEPHrine 0.3 mg/0.3 mL Soaj injection Commonly known as: EPI-PEN Inject 0.3 mg into the muscle as needed for anaphylaxis.   ezetimibe 10 MG  tablet Commonly known as: ZETIA Take 1 tablet (10 mg total) by mouth daily.   gabapentin 100 MG capsule Commonly known as: NEURONTIN Take 300 mg by mouth at bedtime.   isosorbide mononitrate 30 MG 24 hr tablet Commonly known as: IMDUR TAKE 1 TABLET(30 MG) BY MOUTH DAILY   MELATONIN PO Take 1 tablet by mouth daily.   metoprolol succinate 25 MG 24 hr tablet Commonly known as: TOPROL-XL TAKE 1 TABLET BY MOUTH DAILY   nitroGLYCERIN 0.4 MG SL tablet Commonly known as: NITROSTAT Place 1 tablet (0.4 mg total) under the tongue every 5 (five) minutes as needed. For chest pain   omega-3 acid ethyl esters 1 g capsule Commonly known as: Lovaza Take 4 capsules (4 g total) by mouth daily.   pantoprazole 40 MG tablet Commonly known as: PROTONIX TAKE 1 TABLET(40 MG) BY MOUTH DAILY   ranolazine 1000 MG SR tablet Commonly known as: RANEXA TAKE 1 TABLET(1000 MG) BY MOUTH TWICE DAILY   traMADol 50 MG tablet Commonly known as: ULTRAM Take 100 mg by mouth in the morning and at bedtime.   TURMERIC PO Take 1 capsule by mouth daily.        Allergies  Allergen Reactions   Alpha-Gal Anaphylaxis   Beef-Derived Drug Products Anaphylaxis   Other Anaphylaxis    No mammal protein substances   Benazepril Cough   Erythromycin Diarrhea   Indomethacin Diarrhea   Lisinopril Cough   Biaxin [Clarithromycin] Rash   Penicillins Rash    Consultations: None   Procedures/Studies: ECHOCARDIOGRAM COMPLETE Result Date: 12/30/2023    ECHOCARDIOGRAM REPORT   Patient Name:   Courtney Steele Date of Exam: 12/30/2023 Medical Rec #:  [id number]     Height:       63.0 in Accession #:    [id number]    Weight:       220.0 lb Date of Birth:  June 24, 1951     BSA:          2.014 m Patient Age:    72 years      BP:           160/75 mmHg Patient Gender: F             HR:           83 bpm. Exam Location:  Inpatient Procedure: 2D Echo, Cardiac Doppler and Color Doppler Indications:    Pulmonary Embolism  History:         Patient has no prior history of Echocardiogram examinations.                 CAD; Risk Factors:Hypertension.  Sonographer:    Amy Chionchio Referring Phys: [id number] ARSHAD N KAKRAKANDY IMPRESSIONS  1. Left ventricular ejection fraction, by estimation, is 70 to 75%. The left ventricle has hyperdynamic function. The left ventricle has no regional wall motion abnormalities. There is mild left ventricular hypertrophy. Left ventricular diastolic parameters are consistent with Grade I diastolic dysfunction (impaired relaxation).  2. Right ventricular systolic  function is normal. The right ventricular size is normal. There is mildly elevated pulmonary artery systolic pressure. The estimated right ventricular systolic pressure is 43.5 mmHg.  3. The mitral valve is normal in structure. No evidence of mitral valve regurgitation. No evidence of mitral stenosis.  4. The aortic valve is tricuspid. There is mild thickening of the aortic valve. Aortic valve regurgitation is trivial. Aortic valve sclerosis is present, with no evidence of aortic valve stenosis.  5. The inferior vena cava is dilated in size with >50% respiratory variability, suggesting right atrial pressure of 8 mmHg. FINDINGS  Left Ventricle: Left ventricular ejection fraction, by estimation, is 70 to 75%. The left ventricle has hyperdynamic function. The left ventricle has no regional wall motion abnormalities. The left ventricular internal cavity size was normal in size. There is mild left ventricular hypertrophy. Left ventricular diastolic parameters are consistent with Grade I diastolic dysfunction (impaired relaxation). Right Ventricle: The right ventricular size is normal. No increase in right ventricular wall thickness. Right ventricular systolic function is normal. There is mildly elevated pulmonary artery systolic pressure. The tricuspid regurgitant velocity is 2.98  m/s, and with an assumed right atrial pressure of 8 mmHg, the estimated right ventricular  systolic pressure is 43.5 mmHg. Left Atrium: Left atrial size was normal in size. Right Atrium: Right atrial size was normal in size. Pericardium: Trivial pericardial effusion is present. Mitral Valve: The mitral valve is normal in structure. No evidence of mitral valve regurgitation. No evidence of mitral valve stenosis. MV peak gradient, 4.3 mmHg. The mean mitral valve gradient is 2.0 mmHg. Tricuspid Valve: The tricuspid valve is normal in structure. Tricuspid valve regurgitation is trivial. No evidence of tricuspid stenosis. Aortic Valve: The aortic valve is tricuspid. There is mild thickening of the aortic valve. Aortic valve regurgitation is trivial. Aortic regurgitation PHT measures 561 msec. Aortic valve sclerosis is present, with no evidence of aortic valve stenosis. Aortic valve mean gradient measures 5.3 mmHg. Aortic valve peak gradient measures 10.3 mmHg. Aortic valve area, by VTI measures 2.25 cm. Pulmonic Valve: The pulmonic valve was normal in structure. Pulmonic valve regurgitation is trivial. No evidence of pulmonic stenosis. Aorta: The aortic root is normal in size and structure. Venous: The inferior vena cava is dilated in size with greater than 50% respiratory variability, suggesting right atrial pressure of 8 mmHg. IAS/Shunts: The interatrial septum was not well visualized.  LEFT VENTRICLE PLAX 2D LVIDd:         4.00 cm     Diastology LVIDs:         2.80 cm     LV e' medial:    7.07 cm/s LV PW:         1.10 cm     LV E/e' medial:  6.9 LV IVS:        1.10 cm     LV e' lateral:   5.55 cm/s LVOT diam:     2.00 cm     LV E/e' lateral: 8.7 LV SV:         62 LV SV Index:   31 LVOT Area:     3.14 cm  LV Volumes (MOD) LV vol d, MOD A2C: 74.0 ml LV vol d, MOD A4C: 66.4 ml LV vol s, MOD A2C: 16.9 ml LV vol s, MOD A4C: 19.4 ml LV SV MOD A2C:     57.1 ml LV SV MOD A4C:     66.4 ml LV SV MOD BP:      54.8  ml RIGHT VENTRICLE            IVC RV Basal diam:  3.40 cm    IVC diam: 2.10 cm RV S prime:     9.82  cm/s TAPSE (M-mode): 1.7 cm LEFT ATRIUM             Index        RIGHT ATRIUM           Index LA Vol (A2C):   57.7 ml 28.66 ml/m  RA Area:     15.20 cm LA Vol (A4C):   64.0 ml 31.78 ml/m  RA Volume:   36.40 ml  18.08 ml/m LA Biplane Vol: 62.2 ml 30.89 ml/m  AORTIC VALVE                     PULMONIC VALVE AV Area (Vmax):    2.04 cm      PV Vmax:          0.94 m/s AV Area (Vmean):   2.06 cm      PV Peak grad:     3.5 mmHg AV Area (VTI):     2.25 cm      PR End Diast Vel: 7.73 msec AV Vmax:           160.18 cm/s AV Vmean:          107.556 cm/s AV VTI:            0.276 m AV Peak Grad:      10.3 mmHg AV Mean Grad:      5.3 mmHg LVOT Vmax:         104.00 cm/s LVOT Vmean:        70.400 cm/s LVOT VTI:          0.198 m LVOT/AV VTI ratio: 0.72 AI PHT:            561 msec  AORTA Ao Root diam: 3.10 cm Ao Asc diam:  3.50 cm MITRAL VALVE               TRICUSPID VALVE MV Area (PHT): 4.04 cm    TR Peak grad:   35.5 mmHg MV Area VTI:   3.64 cm    TR Vmax:        298.00 cm/s MV Peak grad:  4.3 mmHg MV Mean grad:  2.0 mmHg    SHUNTS MV Vmax:       1.04 m/s    Systemic VTI:  0.20 m MV Vmean:      62.0 cm/s   Systemic Diam: 2.00 cm MV Decel Time: 188 msec MV E velocity: 48.50 cm/s MV A velocity: 84.90 cm/s MV E/A ratio:  0.57 Weston Brass MD Electronically signed by Weston Brass MD Signature Date/Time: 12/30/2023/8:57:22 PM    Final    VAS [place] LOWER EXTREMITY VENOUS (DVT) Result Date: 12/30/2023  Lower Venous DVT Study Patient Name:  Courtney Steele  Date of Exam:   12/30/2023 Medical Rec #: [id number]      Accession #:    [id number] Date of Birth: [birth year]      Patient Gender: F Patient Age:   73 years Exam Location:  [facility] Procedure:      VAS [place] LOWER EXTREMITY VENOUS (DVT) Referring Phys: Midge Minium --------------------------------------------------------------------------------  Indications: Pain, and SOB. Other Indications: Bilateral PE. Risk Factors: Confirmed PE. Anticoagulation: Eliquis.  Performing Technologist: Dondra Prader RVT  Examination Guidelines: A complete evaluation includes B-mode imaging, spectral  Doppler, color Doppler, and power Doppler as needed of all accessible portions of each vessel. Bilateral testing is considered an integral part of a complete examination. Limited examinations for reoccurring indications may be performed as noted. The reflux portion of the exam is performed with the patient in reverse Trendelenburg.  +---------+---------------+---------+-----------+----------+--------------+ RIGHT    CompressibilityPhasicitySpontaneityPropertiesThrombus Aging +---------+---------------+---------+-----------+----------+--------------+ CFV      Full           Yes      Yes                                 +---------+---------------+---------+-----------+----------+--------------+ SFJ      Full           Yes      Yes                                 +---------+---------------+---------+-----------+----------+--------------+ FV Prox  Full           Yes      Yes                                 +---------+---------------+---------+-----------+----------+--------------+ FV Mid   Full           Yes      Yes                                 +---------+---------------+---------+-----------+----------+--------------+ FV DistalFull           Yes      Yes                                 +---------+---------------+---------+-----------+----------+--------------+ PFV      Full           Yes      Yes                                 +---------+---------------+---------+-----------+----------+--------------+ POP      Full           Yes      Yes                                 +---------+---------------+---------+-----------+----------+--------------+ PTV      Full           Yes      Yes                                 +---------+---------------+---------+-----------+----------+--------------+ PERO     Full           Yes      Yes                                  +---------+---------------+---------+-----------+----------+--------------+ Gastroc  Full                                                        +---------+---------------+---------+-----------+----------+--------------+  GSV      Full           Yes      Yes                                 +---------+---------------+---------+-----------+----------+--------------+   +---------+---------------+---------+-----------+----------+--------------+ LEFT     CompressibilityPhasicitySpontaneityPropertiesThrombus Aging +---------+---------------+---------+-----------+----------+--------------+ CFV      Full           Yes      Yes                                 +---------+---------------+---------+-----------+----------+--------------+ SFJ      Full           Yes      Yes                                 +---------+---------------+---------+-----------+----------+--------------+ FV Prox  Full           Yes      Yes                                 +---------+---------------+---------+-----------+----------+--------------+ FV Mid   Full           Yes      Yes                                 +---------+---------------+---------+-----------+----------+--------------+ FV DistalFull           Yes      Yes                                 +---------+---------------+---------+-----------+----------+--------------+ PFV      Full           Yes      Yes                                 +---------+---------------+---------+-----------+----------+--------------+ POP      None           No       No         dilated   Acute          +---------+---------------+---------+-----------+----------+--------------+ PTV      Full           Yes      Yes                                 +---------+---------------+---------+-----------+----------+--------------+ PERO     Full           Yes      Yes                                  +---------+---------------+---------+-----------+----------+--------------+ Gastroc  Full                                                        +---------+---------------+---------+-----------+----------+--------------+  GSV      Full                                                        +---------+---------------+---------+-----------+----------+--------------+ SSV      Full                                                        +---------+---------------+---------+-----------+----------+--------------+    Findings reported to [place] RN at 12:15 pm.  Summary: RIGHT: - No evidence of deep vein thrombosis in the lower extremity. No indirect evidence of obstruction proximal to the inguinal ligament.  - No cystic structure found in the popliteal fossa.  LEFT: - Findings consistent with acute deep vein thrombosis involving the left popliteal vein  - No cystic structure found in the popliteal fossa. - All other veins visualized appear fully compressible and demonstrate appropriate Doppler characteristics.  *See table(s) above for measurements and observations. Electronically signed by Carolynn Sayers on 12/30/2023 at 2:05:05 PM.    Final    CT Angio Chest PE W and/or Wo Contrast Result Date: 12/30/2023 CLINICAL DATA:  Pulmonary embolism (PE) suspected, low to intermediate prob, positive D-dimer EXAM: CT ANGIOGRAPHY CHEST WITH CONTRAST TECHNIQUE: Multidetector CT imaging of the chest was performed using the standard protocol during bolus administration of intravenous contrast. Multiplanar CT image reconstructions and MIPs were obtained to evaluate the vascular anatomy. RADIATION DOSE REDUCTION: This exam was performed according to the departmental dose-optimization program which includes automated exposure control, adjustment of the mA and/or kV according to patient size and/or use of iterative reconstruction technique. CONTRAST:  75mL OMNIPAQUE IOHEXOL 350 MG/ML SOLN COMPARISON:  11/26/2020  FINDINGS: Cardiovascular: Occlusive filling defect in the right lower lobe pulmonary artery and right upper lobe branches compatible with pulmonary emboli. No filling defects on the left. No evidence of right heart strain. Heart is normal size. Aorta is normal caliber. Moderate calcifications in the right coronary and left anterior descending coronary arteries. Aortic atherosclerosis. No aneurysm. Mediastinum/Nodes: No mediastinal, hilar, or axillary adenopathy. Trachea and esophagus are unremarkable. Thyroid unremarkable. Lungs/Pleura: No confluent airspace opacities or effusions. Minimal ground-glass densities in the lung bases, likely atelectasis. Upper Abdomen: No acute findings Musculoskeletal: Chest wall soft tissues are unremarkable. No acute bony abnormality. Review of the MIP images confirms the above findings. IMPRESSION: Filling defects in the right pulmonary arteries most pronounced in the right lower lobe but also noted in the right upper lobe compatible with pulmonary emboli. No evidence of right heart strain. Moderate coronary artery disease. Aortic Atherosclerosis (ICD10-I70.0). These results were called by telephone at the time of interpretation on 12/30/2023 at 1:04 am to provider Victory Dakin , who verbally acknowledged these results. Electronically Signed   By: Charlett Nose M.D.   On: 12/30/2023 01:06   DG Chest 2 View Result Date: 12/29/2023 CLINICAL DATA:  Chest pain. EXAM: CHEST - 2 VIEW COMPARISON:  Chest radiograph dated 11/26/2020. FINDINGS: The heart size and mediastinal contours are within normal limits. Vascular calcifications are seen in the aortic arch. Both lungs are clear. Degenerative changes are seen in the spine. IMPRESSION: No active cardiopulmonary disease. Electronically Signed   By: Joselyn Glassman  Litton M.D.   On: 12/29/2023 19:45   (Echo, Carotid, EGD, Colonoscopy, ERCP)    Subjective: No complains  Discharge Exam: Vitals:   12/31/23 0404 12/31/23 0827  BP: 139/74 (!) 145/62   Pulse: 77 88  Resp: 16 20  Temp: 98.1 F (36.7 C) 98 F (36.7 C)  SpO2: 92% 90%   Vitals:   12/30/23 2114 12/30/23 2353 12/31/23 0404 12/31/23 0827  BP: (!) 163/81 (!) 145/70 139/74 (!) 145/62  Pulse: 84 74 77 88  Resp: 16 16 16 20   Temp: 98.1 F (36.7 C) 98.1 F (36.7 C) 98.1 F (36.7 C) 98 F (36.7 C)  TempSrc: Oral Oral Oral Oral  SpO2: 93% 93% 92% 90%  Weight:      Height:        General: Pt is alert, awake, not in acute distress Cardiovascular: RRR, S1/S2 +, no rubs, no gallops Respiratory: CTA bilaterally, no wheezing, no rhonchi Abdominal: Soft, NT, ND, bowel sounds + Extremities: no edema, no cyanosis    The results of significant diagnostics from this hospitalization (including imaging, microbiology, ancillary and laboratory) are listed below for reference.     Microbiology: No results found for this or any previous visit (from the past 240 hours).   Labs: BNP (last 3 results) Recent Labs    12/30/23 1603  BNP 24.4   Basic Metabolic Panel: Recent Labs  Lab 12/29/23 1929 12/30/23 0630  NA 142 132*  K 3.7 3.6  CL 103 99  CO2 28 24  GLUCOSE 121* 110*  BUN 19 15  CREATININE 0.75 0.66  CALCIUM 9.3 8.3*   Liver Function Tests: Recent Labs  Lab 12/29/23 2123  AST 20  ALT 17  ALKPHOS 40  BILITOT 0.8  PROT 6.1*  ALBUMIN 3.3*   No results for input(s): "LIPASE", "AMYLASE" in the last 168 hours. No results for input(s): "AMMONIA" in the last 168 hours. CBC: Recent Labs  Lab 12/29/23 1929 12/30/23 0630 12/31/23 0618  WBC 5.6 6.0 5.2  NEUTROABS  --  2.7  --   HGB 14.1 13.2 13.0  HCT 42.5 40.1 39.4  MCV 97.3 97.6 96.8  PLT 204 177 178   Cardiac Enzymes: No results for input(s): "CKTOTAL", "CKMB", "CKMBINDEX", "TROPONINI" in the last 168 hours. BNP: Invalid input(s): "POCBNP" CBG: No results for input(s): "GLUCAP" in the last 168 hours. D-Dimer Recent Labs    12/29/23 2123  DDIMER 1.68*   Hgb A1c No results for input(s):  "HGBA1C" in the last 72 hours. Lipid Profile No results for input(s): "CHOL", "HDL", "LDLCALC", "TRIG", "CHOLHDL", "LDLDIRECT" in the last 72 hours. Thyroid function studies No results for input(s): "TSH", "T4TOTAL", "T3FREE", "THYROIDAB" in the last 72 hours.  Invalid input(s): "FREET3" Anemia work up No results for input(s): "VITAMINB12", "FOLATE", "FERRITIN", "TIBC", "IRON", "RETICCTPCT" in the last 72 hours. Urinalysis    Component Value Date/Time   COLORURINE YELLOW 07/24/2016 2344   APPEARANCEUR CLEAR 07/24/2016 2344   LABSPEC 1.024 07/24/2016 2344   PHURINE 6.0 07/24/2016 2344   GLUCOSEU NEGATIVE 07/24/2016 2344   HGBUR NEGATIVE 07/24/2016 2344   BILIRUBINUR NEGATIVE 07/24/2016 2344   KETONESUR NEGATIVE 07/24/2016 2344   PROTEINUR NEGATIVE 07/24/2016 2344   UROBILINOGEN 0.2 11/22/2012 1624   NITRITE NEGATIVE 07/24/2016 2344   LEUKOCYTESUR TRACE (A) 07/24/2016 2344   Sepsis Labs Recent Labs  Lab 12/29/23 1929 12/30/23 0630 12/31/23 0618  WBC 5.6 6.0 5.2   Microbiology No results found for this or any previous visit (from the past  240 hours).   Time coordinating discharge: Over 35 minutes  SIGNED:   Marinda Elk, MD  [facility] 12/31/2023, 10:26 AM Pager   If 7PM-7AM, please contact night-coverage [email or web address] Password [email or web address]

## 2023-12-31 NOTE — TOC CM/SW Note (Addendum)
Patient was discharged earlier today . NCM was not consulted for Eliquis. NCM received a secure chat to see if NCM could "call in a 30 day free card ".   NCM on extended hold for [facility] in [place] to a pharmacist, provide 30 day eliquis card information was told "it went through no charge".

## 2024-01-08 DIAGNOSIS — I82432 Acute embolism and thrombosis of left popliteal vein: Secondary | ICD-10-CM | POA: Diagnosis not present

## 2024-01-08 DIAGNOSIS — E7800 Pure hypercholesterolemia, unspecified: Secondary | ICD-10-CM | POA: Diagnosis not present

## 2024-01-08 DIAGNOSIS — B3731 Acute candidiasis of vulva and vagina: Secondary | ICD-10-CM | POA: Diagnosis not present

## 2024-01-08 DIAGNOSIS — I2510 Atherosclerotic heart disease of native coronary artery without angina pectoris: Secondary | ICD-10-CM | POA: Diagnosis not present

## 2024-01-08 DIAGNOSIS — T781XXA Other adverse food reactions, not elsewhere classified, initial encounter: Secondary | ICD-10-CM | POA: Diagnosis not present

## 2024-01-08 DIAGNOSIS — I119 Hypertensive heart disease without heart failure: Secondary | ICD-10-CM | POA: Diagnosis not present

## 2024-01-08 DIAGNOSIS — M797 Fibromyalgia: Secondary | ICD-10-CM | POA: Diagnosis not present

## 2024-01-08 DIAGNOSIS — E1169 Type 2 diabetes mellitus with other specified complication: Secondary | ICD-10-CM | POA: Diagnosis not present

## 2024-01-08 DIAGNOSIS — F322 Major depressive disorder, single episode, severe without psychotic features: Secondary | ICD-10-CM | POA: Diagnosis not present

## 2024-01-08 DIAGNOSIS — I2699 Other pulmonary embolism without acute cor pulmonale: Secondary | ICD-10-CM | POA: Diagnosis not present

## 2024-01-15 NOTE — Progress Notes (Signed)
Cardiology Office Note:    Date:  01/21/2024   ID:  Courtney Steele, DOB 06/21/1951, MRN [id number]  PCP:  Maurice Small, MD (Inactive)   [facility] Providers Cardiologist:  Millisa Giarrusso [place], MD     Referring MD: No ref. provider found   Chief Complaint  Patient presents with   Coronary Artery Disease    History of Present Illness:    Courtney Steele is a 73 y.o. female with a history of CAD s/p DES-LAD in 2012, hypertension, hyperlipidemia, fibromyalgia and obesity who presents for follow-up related to CAD. Most recent cardiac catheterization in 2016 showed patent LAD stent without significant residual disease. She has had occasional twinges of chest pain since this time, treated with Ranexa and Imdur. Lexiscan Myoview in 12/2020 was negative for ischemia.   Recently she was admitted after experiencing symptoms of SOB with exertion. She was diagnosed with a PE and left leg DVT. Started on Eliquis. BNP and Ecg were normal. Troponin normal. Echo was unremarkable. On follow up today she notes her breathing is better.   Past Medical History:  Diagnosis Date   Borderline diabetes    CAD (coronary artery disease)    a. 2012: DES to LAD    COVID    Degenerative joint disease    Depression    Diabetes mellitus    Fibromyalgia    GERD (gastroesophageal reflux disease)    Groin hematoma    Hematoma    Radial catheter site July 13, 2011   Hyperlipidemia    Hypertension    Morbid obesity (HCC)    Osteoarthritis    Pneumonia     Past Surgical History:  Procedure Laterality Date   ABDOMINAL HYSTERECTOMY     ANGIOPLASTY     BUNIONECTOMY     CARDIAC CATHETERIZATION     revealing patency of the LAD stent with mild compromise of  the diagonal and moderate mid LAD stenosis, but does not appear to  be high grade.    CARDIAC CATHETERIZATION N/A 10/31/2015   Procedure: Left Heart Cath and Coronary Angiography;  Surgeon: Frank Pilger M [place], MD;  Location: [facility] INVASIVE CV LAB;  Service:  Cardiovascular;  Laterality: N/A;   CARPAL TUNNEL RELEASE     CATARACT EXTRACTION     CHOLECYSTECTOMY     KNEE ARTHROPLASTY Right 10/11/2022   Procedure: COMPUTER ASSISTED TOTAL KNEE ARTHROPLASTY;  Surgeon: Samson Frederic, MD;  Location: [facility] ORS;  Service: Orthopedics;  Laterality: Right;  150   KNEE ARTHROSCOPY     right ulnar nerve transposition      TONSILLECTOMY      Current Medications: Current Meds  Medication Sig   acetaminophen (TYLENOL) 500 MG tablet Take 500 mg by mouth every 6 (six) hours as needed for moderate pain.   albuterol (PROVENTIL HFA;VENTOLIN HFA) 108 (90 BASE) MCG/ACT inhaler Inhale 1-2 puffs into the lungs every 6 (six) hours as needed for wheezing or shortness of breath.   amLODipine (NORVASC) 2.5 MG tablet TAKE 1 TABLET(2.5 MG) BY MOUTH DAILY   apixaban (ELIQUIS) 5 MG TABS tablet Take 2 tablets (10 mg total) by mouth 2 (two) times daily for 6 days, THEN 1 tablet (5 mg total) 2 (two) times daily.   atorvastatin (LIPITOR) 40 MG tablet Take 1 tablet (40 mg total) by mouth daily.   Calcium Carb-Cholecalciferol (CALCIUM + VITAMIN D3 PO) Take 1 tablet by mouth daily.   cyclobenzaprine (FLEXERIL) 5 MG tablet Take 5-10 mg by mouth 3 (three) times daily  as needed for muscle spasms.   DULoxetine (CYMBALTA) 30 MG capsule Take 90 mg by mouth daily.   empagliflozin (JARDIANCE) 10 MG TABS tablet Take 10 mg by mouth daily.   EPINEPHrine 0.3 mg/0.3 mL IJ SOAJ injection Inject 0.3 mg into the muscle as needed for anaphylaxis.   ezetimibe (ZETIA) 10 MG tablet Take 1 tablet (10 mg total) by mouth daily.   gabapentin (NEURONTIN) 100 MG capsule Take 300 mg by mouth at bedtime.   isosorbide mononitrate (IMDUR) 30 MG 24 hr tablet TAKE 1 TABLET(30 MG) BY MOUTH DAILY   MELATONIN PO Take 1 tablet by mouth daily.   metoprolol succinate (TOPROL-XL) 25 MG 24 hr tablet TAKE 1 TABLET BY MOUTH DAILY   nitroGLYCERIN (NITROSTAT) 0.4 MG SL tablet Place 1 tablet (0.4 mg total) under the tongue every  5 (five) minutes as needed. For chest pain   omega-3 acid ethyl esters (LOVAZA) 1 g capsule Take 4 capsules (4 g total) by mouth daily.   pantoprazole (PROTONIX) 40 MG tablet TAKE 1 TABLET(40 MG) BY MOUTH DAILY   ranolazine (RANEXA) 1000 MG SR tablet TAKE 1 TABLET(1000 MG) BY MOUTH TWICE DAILY   traMADol (ULTRAM) 50 MG tablet Take 100 mg by mouth in the morning and at bedtime.   [DISCONTINUED] aspirin EC 81 MG tablet Take 1 tablet (81 mg total) by mouth daily. Swallow whole.     Allergies:   Alpha-gal, Beef-derived drug products, Other, Benazepril, Erythromycin, Indomethacin, Lisinopril, Biaxin [clarithromycin], and Penicillins   Social History   Socioeconomic History   Marital status: Married    Spouse name: Not on file   Number of children: 2   Years of education: Not on file   Highest education level: Not on file  Occupational History   Occupation: URGENT CARE    Employer: [employer]  Tobacco Use   Smoking status: Never   Smokeless tobacco: Never  Vaping Use   Vaping status: Never Used  Substance and Sexual Activity   Alcohol use: Yes    Comment: rare   Drug use: No   Sexual activity: Not Currently    Partners: Male    Comment: MARRIED  Other Topics Concern   Not on file  Social History Narrative   Not on file   Social Drivers of Health   Financial Resource Strain: Not on file  Food Insecurity: No Food Insecurity (12/30/2023)   Hunger Vital Sign    Worried About Running Out of Food in the Last Year: Never true    Ran Out of Food in the Last Year: Never true  Transportation Needs: No Transportation Needs (12/30/2023)   PRAPARE - Administrator, Civil Service (Medical): No    Lack of Transportation (Non-Medical): No  Physical Activity: Not on file  Stress: Not on file  Social Connections: Not on file     Family History: The patient's family history includes Breast cancer in her maternal aunt; Breast cancer (age of onset: 40) in her cousin; Coronary  artery disease (age of onset: 37) in her father; Other in her mother.  ROS:   Please see the history of present illness.     All other systems reviewed and are negative.  EKGs/Labs/Other Studies Reviewed:    The following studies were reviewed today: LHC 2015-11-05:  Prox RCA to Mid RCA lesion, 10% stenosed. Mid LAD lesion, 30% stenosed. The left ventricular systolic function is normal.   1. Nonobstructive CAD. The stent in the proximal LAD is  widely patent.  2. Normal LV function.   Plan: continue medical therapy. Consider alternative causes for chest pain.   Lexiscan myoview 01/06/2021:   The left ventricular ejection fraction is hyperdynamic (>65%). Nuclear stress EF: 72%. There was no ST segment deviation noted during stress. No T wave inversion was noted during stress. The study is normal. This is a low risk study.        Recent Labs: 12/29/2023: ALT 17 12/30/2023: B Natriuretic Peptide 24.4; BUN 15; Creatinine, Ser 0.66; Potassium 3.6; Sodium 132 12/31/2023: Hemoglobin 13.0; Platelets 178  Recent Lipid Panel    Component Value Date/Time   CHOL 152 01/10/2023 0957   TRIG 177 (H) 01/10/2023 0957   HDL 46 01/10/2023 0957   CHOLHDL 3.3 01/10/2023 0957   CHOLHDL 3.6 10/29/2015 0347   VLDL 41 (H) 10/29/2015 0347   LDLCALC 76 01/10/2023 0957  Dated 10/17/23: A1c 6.2%   Risk Assessment/Calculations:                Physical Exam:    VS:  BP 116/67   Pulse 84   Ht 5\' 3"  (1.6 m)   Wt 215 lb 12.8 oz (97.9 kg)   SpO2 95%   BMI 38.23 kg/m     Wt Readings from Last 3 Encounters:  01/21/24 215 lb 12.8 oz (97.9 kg)  12/29/23 220 lb (99.8 kg)  12/25/22 218 lb (98.9 kg)     GEN:  Well nourished, obese in no acute distress HEENT: Normal NECK: No JVD; No carotid bruits LYMPHATICS: No lymphadenopathy CARDIAC: RRR, no murmurs, rubs, gallops RESPIRATORY:  Clear to auscultation without rales, wheezing or rhonchi  ABDOMEN: Soft, non-tender,  non-distended MUSCULOSKELETAL:  No edema; No deformity  SKIN: Warm and dry NEUROLOGIC:  Alert and oriented x 3 PSYCHIATRIC:  Normal affect   ASSESSMENT:    1. Coronary artery disease involving native coronary artery of native heart without angina pectoris   2. Essential hypertension   3. Hyperlipidemia LDL goal <70   4. Other acute pulmonary embolism without acute cor pulmonale (HCC)    PLAN:    In order of problems listed above:  CAD s/p remote stent of LAD. No anginal symptoms on Ranexa and Toprol XL. Last cath in 2016 was OK. Myoview in 2022 was normal. Since she is now on Eliquis recommend she stop ASA Recent PE and left leg DVT. Now on Eliquis. Unprovoked HTN controlled HLD on Zetia and high dose lipitor. LDL 76.           Medication Adjustments/Labs and Tests Ordered: Current medicines are reviewed at length with the patient today.  Concerns regarding medicines are outlined above.  No orders of the defined types were placed in this encounter.  No orders of the defined types were placed in this encounter.   Patient Instructions  Medication Instructions:  Continue same medications *If you need a refill on your cardiac medications before your next appointment, please call your pharmacy*   Lab Work: None ordered   Testing/Procedures: None ordered   Follow-Up: At [facility], you and your health needs are our priority.  As part of our continuing mission to provide you with exceptional heart care, we have created designated Provider Care Teams.  These Care Teams include your primary Cardiologist (physician) and Advanced Practice Providers (APPs -  Physician Assistants and Nurse Practitioners) who all work together to provide you with the care you need, when you need it.  We recommend signing up for the patient  portal called "MyChart".  Sign up information is provided on this After Visit Summary.  MyChart is used to connect with patients for Virtual Visits  (Telemedicine).  Patients are able to view lab/test results, encounter notes, upcoming appointments, etc.  Non-urgent messages can be sent to your provider as well.   To learn more about what you can do with MyChart, go to [email or web address].    Your next appointment:  1 year   Call in Oct to schedule Feb appointment     Provider:  Dr.Ark Agrusa           Signed, Regie Bunner [place], MD  01/21/2024 4:33 PM    [facility]

## 2024-01-18 ENCOUNTER — Other Ambulatory Visit: Payer: Self-pay | Admitting: Cardiology

## 2024-01-18 DIAGNOSIS — I10 Essential (primary) hypertension: Secondary | ICD-10-CM

## 2024-01-21 ENCOUNTER — Ambulatory Visit: Payer: Medicare Other | Attending: Cardiology | Admitting: Cardiology

## 2024-01-21 ENCOUNTER — Encounter: Payer: Self-pay | Admitting: Cardiology

## 2024-01-21 VITALS — BP 116/67 | HR 84 | Ht 63.0 in | Wt 215.8 lb

## 2024-01-21 DIAGNOSIS — E785 Hyperlipidemia, unspecified: Secondary | ICD-10-CM | POA: Diagnosis present

## 2024-01-21 DIAGNOSIS — I2699 Other pulmonary embolism without acute cor pulmonale: Secondary | ICD-10-CM | POA: Insufficient documentation

## 2024-01-21 DIAGNOSIS — I2510 Atherosclerotic heart disease of native coronary artery without angina pectoris: Secondary | ICD-10-CM | POA: Diagnosis present

## 2024-01-21 DIAGNOSIS — I10 Essential (primary) hypertension: Secondary | ICD-10-CM | POA: Diagnosis present

## 2024-01-21 NOTE — Patient Instructions (Signed)
Medication Instructions:  Continue same medications *If you need a refill on your cardiac medications before your next appointment, please call your pharmacy*   Lab Work: None ordered   Testing/Procedures: None ordered   Follow-Up: At [facility], you and your health needs are our priority.  As part of our continuing mission to provide you with exceptional heart care, we have created designated Provider Care Teams.  These Care Teams include your primary Cardiologist (physician) and Advanced Practice Providers (APPs -  Physician Assistants and Nurse Practitioners) who all work together to provide you with the care you need, when you need it.  We recommend signing up for the patient portal called "MyChart".  Sign up information is provided on this After Visit Summary.  MyChart is used to connect with patients for Virtual Visits (Telemedicine).  Patients are able to view lab/test results, encounter notes, upcoming appointments, etc.  Non-urgent messages can be sent to your provider as well.   To learn more about what you can do with MyChart, go to [email or web address].    Your next appointment:  1 year    Call in Oct to schedule Feb appointment     Provider: Dr.Jordan

## 2024-01-27 DIAGNOSIS — Z96651 Presence of right artificial knee joint: Secondary | ICD-10-CM | POA: Diagnosis not present

## 2024-02-06 DIAGNOSIS — G894 Chronic pain syndrome: Secondary | ICD-10-CM | POA: Diagnosis not present

## 2024-02-06 DIAGNOSIS — M47816 Spondylosis without myelopathy or radiculopathy, lumbar region: Secondary | ICD-10-CM | POA: Diagnosis not present

## 2024-02-06 DIAGNOSIS — M47812 Spondylosis without myelopathy or radiculopathy, cervical region: Secondary | ICD-10-CM | POA: Diagnosis not present

## 2024-02-06 DIAGNOSIS — E1142 Type 2 diabetes mellitus with diabetic polyneuropathy: Secondary | ICD-10-CM | POA: Diagnosis not present

## 2024-02-07 ENCOUNTER — Other Ambulatory Visit: Payer: Self-pay | Admitting: Nurse Practitioner

## 2024-02-17 ENCOUNTER — Other Ambulatory Visit: Payer: Self-pay | Admitting: Cardiology

## 2024-02-17 DIAGNOSIS — I10 Essential (primary) hypertension: Secondary | ICD-10-CM

## 2024-03-20 ENCOUNTER — Other Ambulatory Visit: Payer: Self-pay | Admitting: Cardiology

## 2024-03-20 DIAGNOSIS — I10 Essential (primary) hypertension: Secondary | ICD-10-CM

## 2024-03-26 ENCOUNTER — Other Ambulatory Visit: Payer: Self-pay | Admitting: Cardiology

## 2024-03-26 DIAGNOSIS — I2510 Atherosclerotic heart disease of native coronary artery without angina pectoris: Secondary | ICD-10-CM

## 2024-03-31 DIAGNOSIS — H35371 Puckering of macula, right eye: Secondary | ICD-10-CM | POA: Diagnosis not present

## 2024-04-01 DIAGNOSIS — R399 Unspecified symptoms and signs involving the genitourinary system: Secondary | ICD-10-CM | POA: Diagnosis not present

## 2024-04-01 DIAGNOSIS — N898 Other specified noninflammatory disorders of vagina: Secondary | ICD-10-CM | POA: Diagnosis not present

## 2024-04-01 DIAGNOSIS — M1991 Primary osteoarthritis, unspecified site: Secondary | ICD-10-CM | POA: Insufficient documentation

## 2024-04-01 DIAGNOSIS — F32A Depression, unspecified: Secondary | ICD-10-CM | POA: Insufficient documentation

## 2024-04-01 DIAGNOSIS — M2550 Pain in unspecified joint: Secondary | ICD-10-CM | POA: Insufficient documentation

## 2024-04-01 DIAGNOSIS — R81 Glycosuria: Secondary | ICD-10-CM | POA: Diagnosis not present

## 2024-04-02 DIAGNOSIS — M47816 Spondylosis without myelopathy or radiculopathy, lumbar region: Secondary | ICD-10-CM | POA: Diagnosis not present

## 2024-04-02 DIAGNOSIS — E1142 Type 2 diabetes mellitus with diabetic polyneuropathy: Secondary | ICD-10-CM | POA: Diagnosis not present

## 2024-04-02 DIAGNOSIS — G894 Chronic pain syndrome: Secondary | ICD-10-CM | POA: Diagnosis not present

## 2024-04-02 DIAGNOSIS — M47812 Spondylosis without myelopathy or radiculopathy, cervical region: Secondary | ICD-10-CM | POA: Diagnosis not present

## 2024-05-14 ENCOUNTER — Other Ambulatory Visit: Payer: Self-pay | Admitting: Cardiology

## 2024-05-19 DIAGNOSIS — E1169 Type 2 diabetes mellitus with other specified complication: Secondary | ICD-10-CM | POA: Diagnosis not present

## 2024-05-19 DIAGNOSIS — F322 Major depressive disorder, single episode, severe without psychotic features: Secondary | ICD-10-CM | POA: Diagnosis not present

## 2024-05-19 DIAGNOSIS — I2699 Other pulmonary embolism without acute cor pulmonale: Secondary | ICD-10-CM | POA: Diagnosis not present

## 2024-05-19 DIAGNOSIS — K5730 Diverticulosis of large intestine without perforation or abscess without bleeding: Secondary | ICD-10-CM | POA: Diagnosis not present

## 2024-05-19 DIAGNOSIS — E7800 Pure hypercholesterolemia, unspecified: Secondary | ICD-10-CM | POA: Diagnosis not present

## 2024-05-19 DIAGNOSIS — M797 Fibromyalgia: Secondary | ICD-10-CM | POA: Diagnosis not present

## 2024-05-19 DIAGNOSIS — T781XXA Other adverse food reactions, not elsewhere classified, initial encounter: Secondary | ICD-10-CM | POA: Diagnosis not present

## 2024-05-19 DIAGNOSIS — Z0000 Encounter for general adult medical examination without abnormal findings: Secondary | ICD-10-CM | POA: Diagnosis not present

## 2024-05-19 DIAGNOSIS — I2510 Atherosclerotic heart disease of native coronary artery without angina pectoris: Secondary | ICD-10-CM | POA: Diagnosis not present

## 2024-05-19 DIAGNOSIS — I119 Hypertensive heart disease without heart failure: Secondary | ICD-10-CM | POA: Diagnosis not present

## 2024-05-19 DIAGNOSIS — I82432 Acute embolism and thrombosis of left popliteal vein: Secondary | ICD-10-CM | POA: Diagnosis not present

## 2024-05-25 ENCOUNTER — Other Ambulatory Visit: Payer: Self-pay | Admitting: Cardiology

## 2024-05-26 ENCOUNTER — Other Ambulatory Visit: Payer: Self-pay | Admitting: Internal Medicine

## 2024-05-26 DIAGNOSIS — Z1231 Encounter for screening mammogram for malignant neoplasm of breast: Secondary | ICD-10-CM

## 2024-05-28 DIAGNOSIS — M47816 Spondylosis without myelopathy or radiculopathy, lumbar region: Secondary | ICD-10-CM | POA: Diagnosis not present

## 2024-05-28 DIAGNOSIS — M47812 Spondylosis without myelopathy or radiculopathy, cervical region: Secondary | ICD-10-CM | POA: Diagnosis not present

## 2024-05-28 DIAGNOSIS — E1142 Type 2 diabetes mellitus with diabetic polyneuropathy: Secondary | ICD-10-CM | POA: Diagnosis not present

## 2024-05-28 DIAGNOSIS — G894 Chronic pain syndrome: Secondary | ICD-10-CM | POA: Diagnosis not present

## 2024-06-18 ENCOUNTER — Other Ambulatory Visit: Payer: Self-pay | Admitting: Cardiology

## 2024-06-23 ENCOUNTER — Telehealth: Payer: Self-pay

## 2024-06-23 DIAGNOSIS — Z7901 Long term (current) use of anticoagulants: Secondary | ICD-10-CM | POA: Diagnosis not present

## 2024-06-23 DIAGNOSIS — K5909 Other constipation: Secondary | ICD-10-CM | POA: Diagnosis not present

## 2024-06-23 DIAGNOSIS — Z860100 Personal history of colon polyps, unspecified: Secondary | ICD-10-CM | POA: Diagnosis not present

## 2024-06-23 NOTE — Telephone Encounter (Signed)
   Pre-operative Risk Assessment    Patient Name: Courtney Steele  DOB: 26-Aug-1951 MRN: [id number]   Date of last office visit: 01/21/24 Date of next office visit: n/a   Request for Surgical Clearance    Procedure:  colonoscopy   Date of Surgery:  Clearance 07/17/24                                 Surgeon:  Dr. Saintclair Jasper  Surgeon's Group or Practice Name:  [facility]  Phone number:  [phone] Fax number:  [phone]   Type of Clearance Requested:   - Medical  - Pharmacy:  Hold Apixaban  (Eliquis ) 2 days prior    Type of Anesthesia:  propofol     Additional requests/questions:    Courtney Steele   06/23/2024, 4:20 PM

## 2024-06-24 ENCOUNTER — Telehealth: Payer: Self-pay

## 2024-06-24 NOTE — Telephone Encounter (Signed)
 Patient with diagnosis of PE and DVT on Eliquis  for anticoagulation.  Admitted for PE/DVT on 12/29/23.  Procedure: colonoscopy Date of procedure: 07/17/24   CrCl 117 ml/min Platelet count 178K   Per office protocol, patient can hold Eliquis  for 2 days prior to procedure.    **This guidance is not considered finalized until pre-operative APP has relayed final recommendations.**

## 2024-06-24 NOTE — Telephone Encounter (Signed)
 Patient has been scheduled for televisit med rec and consent done     Patient Consent for Virtual Visit         Courtney Steele has provided verbal consent on 06/24/2024 for a virtual visit (video or telephone).   CONSENT FOR VIRTUAL VISIT FOR:  Courtney Steele  By participating in this virtual visit I agree to the following:  I hereby voluntarily request, consent and authorize [facility] and its employed or contracted physicians, physician assistants, nurse practitioners or other licensed health care professionals (the Practitioner), to provide me with telemedicine health care services (the "Services) as deemed necessary by the treating Practitioner. I acknowledge and consent to receive the Services by the Practitioner via telemedicine. I understand that the telemedicine visit will involve communicating with the Practitioner through live audiovisual communication technology and the disclosure of certain medical information by electronic transmission. I acknowledge that I have been given the opportunity to request an in-person assessment or other available alternative prior to the telemedicine visit and am voluntarily participating in the telemedicine visit.  I understand that I have the right to withhold or withdraw my consent to the use of telemedicine in the course of my care at any time, without affecting my right to future care or treatment, and that the Practitioner or I may terminate the telemedicine visit at any time. I understand that I have the right to inspect all information obtained and/or recorded in the course of the telemedicine visit and may receive copies of available information for a reasonable fee.  I understand that some of the potential risks of receiving the Services via telemedicine include:  Delay or interruption in medical evaluation due to technological equipment failure or disruption; Information transmitted may not be sufficient (e.g. poor resolution of images)  to allow for appropriate medical decision making by the Practitioner; and/or  In rare instances, security protocols could fail, causing a breach of personal health information.  Furthermore, I acknowledge that it is my responsibility to provide information about my medical history, conditions and care that is complete and accurate to the best of my ability. I acknowledge that Practitioner's advice, recommendations, and/or decision may be based on factors not within their control, such as incomplete or inaccurate data provided by me or distortions of diagnostic images or specimens that may result from electronic transmissions. I understand that the practice of medicine is not an exact science and that Practitioner makes no warranties or guarantees regarding treatment outcomes. I acknowledge that a copy of this consent can be made available to me via my patient portal [facility] MyChart), or I can request a printed copy by calling the office of [facility].    I understand that my insurance will be billed for this visit.   I have read or had this consent read to me. I understand the contents of this consent, which adequately explains the benefits and risks of the Services being provided via telemedicine.  I have been provided ample opportunity to ask questions regarding this consent and the Services and have had my questions answered to my satisfaction. I give my informed consent for the services to be provided through the use of telemedicine in my medical care

## 2024-06-24 NOTE — Telephone Encounter (Signed)
Patient has been scheduled for televisit.

## 2024-06-24 NOTE — Telephone Encounter (Signed)
   Name: RUSSELL ENGELSTAD  DOB: 04/15/1951  MRN: [id number]  Primary Cardiologist: Peter [place], MD   Preoperative team, please contact this patient and set up a phone call appointment for further preoperative risk assessment. Please obtain consent and complete medication review. Thank you for your help. Last seen by Dr. [place] on 01/21/2024  I confirm that guidance regarding antiplatelet and oral anticoagulation therapy has been completed and, if necessary, noted below.  Patient with diagnosis of PE and DVT on Eliquis  for anticoagulation.  Admitted for PE/DVT on 12/29/23.   Procedure: colonoscopy Date of procedure: 07/17/24     CrCl 117 ml/min Platelet count 178K     Per office protocol, patient can hold Eliquis  for 2 days prior to procedure.    I also confirmed the patient resides in the state of [place] . As per [facility] Medical Board telemedicine laws, the patient must reside in the state in which the provider is licensed.   Lamarr Satterfield, NP 06/24/2024, 10:56 AM [facility]

## 2024-06-25 ENCOUNTER — Ambulatory Visit
Admission: RE | Admit: 2024-06-25 | Discharge: 2024-06-25 | Disposition: A | Discharge status: Home / Self Care | Source: Ambulatory Visit | Attending: Internal Medicine

## 2024-06-25 DIAGNOSIS — Z1231 Encounter for screening mammogram for malignant neoplasm of breast: Secondary | ICD-10-CM

## 2024-07-06 ENCOUNTER — Ambulatory Visit: Attending: Internal Medicine | Admitting: Student

## 2024-07-06 DIAGNOSIS — Z01810 Encounter for preprocedural cardiovascular examination: Secondary | ICD-10-CM

## 2024-07-06 NOTE — Progress Notes (Signed)
 Virtual Visit via Telephone Note   Because of Courtney Steele co-morbid illnesses, she is at least at moderate risk for complications without adequate follow up.  This format is felt to be most appropriate for this patient at this time.  The patient did not have access to video technology/had technical difficulties with video requiring transitioning to audio format only (telephone).  All issues noted in this document were discussed and addressed.  No physical exam could be performed with this format.  Please refer to the patient's chart for her consent to telehealth for [facility].  Evaluation Performed:  Preoperative cardiovascular risk assessment _____________   Date:  07/06/2024   Patient ID:  Courtney Steele, DOB 1951/11/15, MRN [id number] Patient Location:  Home Provider location:   Office  Primary Care Provider:  Signa Dire, MD (Inactive) Primary Cardiologist:  Peter Swaziland, MD  Chief Complaint / Patient Profile   73 y.o. y/o female with a h/o CAD s/p PCI with DES to LAD 2012, hypertension, hyperlipidemia, PE/DVT, T2DM, fibromyalgia who is pending colonoscopy by Dr. Saintclair on 07/17/2024 and presents today for telephonic preoperative cardiovascular risk assessment.  History of Present Illness    Courtney Steele is a 73 y.o. female who presents via audio/video conferencing for a telehealth visit today.  Pt was last seen in cardiology clinic on 01/21/2024 by Dr. Swaziland.  At that time Courtney Steele was stable from a cardiac standpoint.  The patient is now pending procedure as outlined above. Since her last visit, she is doing well. Patient denies shortness of breath, dyspnea on exertion, orthopnea or PND. She has occasional lower extremity edema mainly in her ankles when she is on her feet for long periods. No edema upon awakening. No chest pain, pressure, or tightness. No palpitations. She is active going to water  aerobics 3 times a week and sometimes doing extra water  walking if  time permits. She also babysits for her infant granddaughter.   Past Medical History    Past Medical History:  Diagnosis Date   Borderline diabetes    CAD (coronary artery disease)    a. 2012: DES to LAD    COVID    Degenerative joint disease    Depression    Diabetes mellitus    Fibromyalgia    GERD (gastroesophageal reflux disease)    Groin hematoma    Hematoma    Radial catheter site July 13, 2011   Hyperlipidemia    Hypertension    Morbid obesity (HCC)    Osteoarthritis    Pneumonia    Past Surgical History:  Procedure Laterality Date   ABDOMINAL HYSTERECTOMY     ANGIOPLASTY     BUNIONECTOMY     CARDIAC CATHETERIZATION     revealing patency of the LAD stent with mild compromise of  the diagonal and moderate mid LAD stenosis, but does not appear to  be high grade.    CARDIAC CATHETERIZATION N/A 10/31/2015   Procedure: Left Heart Cath and Coronary Angiography;  Surgeon: Peter M Swaziland, MD;  Location: [facility] INVASIVE CV LAB;  Service: Cardiovascular;  Laterality: N/A;   CARPAL TUNNEL RELEASE     CATARACT EXTRACTION     CHOLECYSTECTOMY     KNEE ARTHROPLASTY Right 10/11/2022   Procedure: COMPUTER ASSISTED TOTAL KNEE ARTHROPLASTY;  Surgeon: Fidel Rogue, MD;  Location: [facility];  Service: Orthopedics;  Laterality: Right;  150   KNEE ARTHROSCOPY     right ulnar nerve transposition  TONSILLECTOMY      Allergies  Allergies  Allergen Reactions   Alpha-Gal Anaphylaxis   Beef-Derived Drug Products Anaphylaxis   Other Anaphylaxis    No mammal protein substances   Benazepril  Cough   Erythromycin Diarrhea   Indomethacin Diarrhea   Lisinopril Cough   Biaxin [Clarithromycin] Rash   Penicillins Rash    Home Medications    Prior to Admission medications   Medication Sig Start Date End Date Taking? Authorizing Provider  acetaminophen  (TYLENOL ) 500 MG tablet Take 500 mg by mouth every 6 (six) hours as needed for moderate pain.    [provider]  albuterol   (PROVENTIL  HFA;VENTOLIN  HFA) 108 (90 BASE) MCG/ACT inhaler Inhale 1-2 puffs into the lungs every 6 (six) hours as needed for wheezing or shortness of breath. 05/10/15   Deanna Cough, MD  amLODipine  (NORVASC ) 2.5 MG tablet TAKE 1 TABLET(2.5 MG) BY MOUTH DAILY 02/17/24   Swaziland, Peter M, MD  apixaban  (ELIQUIS ) 5 MG TABS tablet Take 2 tablets (10 mg total) by mouth 2 (two) times daily for 6 days, THEN 1 tablet (5 mg total) 2 (two) times daily. 12/31/23 02/05/24  Odell Celinda Balo, MD  atorvastatin  (LIPITOR) 40 MG tablet Take 1 tablet (40 mg total) by mouth daily. 02/25/23   Daneen Damien BROCKS, NP  Calcium  Carb-Cholecalciferol  (CALCIUM  + VITAMIN D3 PO) Take 1 tablet by mouth daily.    [provider]  cyclobenzaprine  (FLEXERIL ) 5 MG tablet Take 5-10 mg by mouth 3 (three) times daily as needed for muscle spasms.    [provider]  DULoxetine  (CYMBALTA ) 30 MG capsule Take 90 mg by mouth daily. 08/18/18   [provider]  empagliflozin  (JARDIANCE ) 10 MG TABS tablet Take 10 mg by mouth daily.    [provider]  EPINEPHrine  0.3 mg/0.3 mL IJ SOAJ injection Inject 0.3 mg into the muscle as needed for anaphylaxis. 09/22/22   Long, Fonda MATSU, MD  ezetimibe  (ZETIA ) 10 MG tablet TAKE 1 TABLET(10 MG) BY MOUTH DAILY 02/07/24   [place]il[place], Peter M, MD  gabapentin  (NEURONTIN ) 100 MG capsule Take 300 mg by mouth at bedtime. 08/07/22   [provider]  icosapent  Ethyl (VASCEPA ) 1 g capsule TAKE 2 CAPSULES BY MOUTH TWICE DAILY 03/26/24   [place], Peter M, MD  isosorbide  mononitrate (IMDUR ) 30 MG 24 hr tablet TAKE 1 TABLET(30 MG) BY MOUTH DAILY 06/19/24   [place], Peter M, MD  MELATONIN PO Take 1 tablet by mouth daily.    [provider]  metoprolol  succinate (TOPROL -XL) 25 MG 24 hr tablet TAKE 1 TABLET BY MOUTH DAILY 03/20/24   [place], Peter M, MD  nitroGLYCERIN  (NITROSTAT ) 0.4 MG SL tablet Place 1 tablet (0.4 mg total) under the tongue every 5 (five) minutes as needed. For chest pain  12/22/20   [place], Peter M, MD  omega-3 acid ethyl esters (LOVAZA ) 1 g capsule Take 4 capsules (4 g total) by mouth daily. 03/26/24 03/25/25  [place], Peter M, MD  pantoprazole  (PROTONIX ) 40 MG tablet TAKE 1 TABLET(40 MG) BY MOUTH DAILY 05/27/24   [place], Peter M, MD  ranolazine  (RANEXA ) 1000 MG SR tablet TAKE 1 TABLET(1000 MG) BY MOUTH TWICE DAILY 03/26/24   [place], Peter M, MD  traMADol  (ULTRAM ) 50 MG tablet Take 100 mg by mouth in the morning and at bedtime.    [provider]    Physical Exam    Vital Signs:  Courtney Steele does not have vital signs available for review today.  Given telephonic  nature of communication, physical exam is limited. AAOx3. NAD. Normal affect.  Speech and respirations are unlabored.  Assessment & Plan    Primary Cardiologist: Peter Swaziland, MD  Preoperative cardiovascular risk assessment.  Colonoscopy by Dr. Saintclair on 07/17/2024.  Chart reviewed as part of pre-operative protocol coverage. According to the RCRI, patient has a 0.9-1.1% risk of MACE. Patient reports activity equivalent to >4.0 METS (water  aerobics 3 times a week, babysitting infant granddaughter).   Given past medical history and time since last visit, based on ACC/AHA guidelines, Courtney Steele would be at acceptable risk for the planned procedure without further cardiovascular testing.   Patient was advised that if she develops new symptoms prior to surgery to contact our office to arrange a follow-up appointment.  she verbalized understanding.  Per Pharm D, patient may hold Eliquis  for 2 days prior to procedure.   I will route this recommendation to the requesting party via Epic fax function.  Please call with questions.  Time:   Today, I have spent 5 minutes with the patient with telehealth technology discussing medical history, symptoms, and management plan.     Barnie Hila, NP  07/06/2024, 7:07 AM

## 2024-07-17 DIAGNOSIS — K640 First degree hemorrhoids: Secondary | ICD-10-CM | POA: Diagnosis not present

## 2024-07-17 DIAGNOSIS — Z860101 Personal history of adenomatous and serrated colon polyps: Secondary | ICD-10-CM | POA: Diagnosis not present

## 2024-07-17 DIAGNOSIS — D125 Benign neoplasm of sigmoid colon: Secondary | ICD-10-CM | POA: Diagnosis not present

## 2024-07-17 DIAGNOSIS — K5730 Diverticulosis of large intestine without perforation or abscess without bleeding: Secondary | ICD-10-CM | POA: Diagnosis not present

## 2024-07-17 DIAGNOSIS — D175 Benign lipomatous neoplasm of intra-abdominal organs: Secondary | ICD-10-CM | POA: Diagnosis not present

## 2024-07-17 DIAGNOSIS — Z09 Encounter for follow-up examination after completed treatment for conditions other than malignant neoplasm: Secondary | ICD-10-CM | POA: Diagnosis not present

## 2024-07-17 DIAGNOSIS — D123 Benign neoplasm of transverse colon: Secondary | ICD-10-CM | POA: Diagnosis not present

## 2024-07-21 DIAGNOSIS — D123 Benign neoplasm of transverse colon: Secondary | ICD-10-CM | POA: Diagnosis not present

## 2024-07-21 DIAGNOSIS — D125 Benign neoplasm of sigmoid colon: Secondary | ICD-10-CM | POA: Diagnosis not present

## 2024-07-23 ENCOUNTER — Other Ambulatory Visit: Payer: Self-pay | Admitting: Internal Medicine

## 2024-07-23 DIAGNOSIS — Z79891 Long term (current) use of opiate analgesic: Secondary | ICD-10-CM | POA: Diagnosis not present

## 2024-07-23 DIAGNOSIS — K629 Disease of anus and rectum, unspecified: Secondary | ICD-10-CM

## 2024-07-23 DIAGNOSIS — M47812 Spondylosis without myelopathy or radiculopathy, cervical region: Secondary | ICD-10-CM | POA: Diagnosis not present

## 2024-07-23 DIAGNOSIS — M47816 Spondylosis without myelopathy or radiculopathy, lumbar region: Secondary | ICD-10-CM | POA: Diagnosis not present

## 2024-07-23 DIAGNOSIS — G894 Chronic pain syndrome: Secondary | ICD-10-CM | POA: Diagnosis not present

## 2024-07-23 DIAGNOSIS — E1142 Type 2 diabetes mellitus with diabetic polyneuropathy: Secondary | ICD-10-CM | POA: Diagnosis not present

## 2024-07-31 ENCOUNTER — Ambulatory Visit
Admission: RE | Admit: 2024-07-31 | Discharge: 2024-07-31 | Disposition: A | Discharge status: Home / Self Care | Source: Ambulatory Visit | Attending: Internal Medicine | Admitting: Internal Medicine

## 2024-07-31 DIAGNOSIS — D120 Benign neoplasm of cecum: Secondary | ICD-10-CM | POA: Diagnosis not present

## 2024-07-31 DIAGNOSIS — K5730 Diverticulosis of large intestine without perforation or abscess without bleeding: Secondary | ICD-10-CM | POA: Diagnosis not present

## 2024-07-31 DIAGNOSIS — K629 Disease of anus and rectum, unspecified: Secondary | ICD-10-CM

## 2024-07-31 MED ORDER — IOPAMIDOL (ISOVUE-300) INJECTION 61%
100.0000 mL | Freq: Once | INTRAVENOUS | Status: AC | PRN
  Administered 2024-07-31: 100 mL via INTRAVENOUS

## 2024-09-02 DIAGNOSIS — M47816 Spondylosis without myelopathy or radiculopathy, lumbar region: Secondary | ICD-10-CM | POA: Diagnosis not present

## 2024-09-02 DIAGNOSIS — M47812 Spondylosis without myelopathy or radiculopathy, cervical region: Secondary | ICD-10-CM | POA: Diagnosis not present

## 2024-09-02 DIAGNOSIS — G894 Chronic pain syndrome: Secondary | ICD-10-CM | POA: Diagnosis not present

## 2024-09-02 DIAGNOSIS — E1142 Type 2 diabetes mellitus with diabetic polyneuropathy: Secondary | ICD-10-CM | POA: Diagnosis not present

## 2024-09-08 DIAGNOSIS — Z23 Encounter for immunization: Secondary | ICD-10-CM | POA: Diagnosis not present

## 2024-10-05 DIAGNOSIS — M25532 Pain in left wrist: Secondary | ICD-10-CM | POA: Diagnosis not present

## 2024-10-29 DIAGNOSIS — G894 Chronic pain syndrome: Secondary | ICD-10-CM | POA: Diagnosis not present

## 2024-10-29 DIAGNOSIS — M47816 Spondylosis without myelopathy or radiculopathy, lumbar region: Secondary | ICD-10-CM | POA: Diagnosis not present

## 2024-10-29 DIAGNOSIS — E1142 Type 2 diabetes mellitus with diabetic polyneuropathy: Secondary | ICD-10-CM | POA: Diagnosis not present

## 2024-10-29 DIAGNOSIS — M47812 Spondylosis without myelopathy or radiculopathy, cervical region: Secondary | ICD-10-CM | POA: Diagnosis not present

## 2024-11-19 DIAGNOSIS — M797 Fibromyalgia: Secondary | ICD-10-CM | POA: Diagnosis not present

## 2024-11-19 DIAGNOSIS — I2510 Atherosclerotic heart disease of native coronary artery without angina pectoris: Secondary | ICD-10-CM | POA: Diagnosis not present

## 2024-11-19 DIAGNOSIS — E1169 Type 2 diabetes mellitus with other specified complication: Secondary | ICD-10-CM | POA: Diagnosis not present

## 2024-11-19 DIAGNOSIS — I119 Hypertensive heart disease without heart failure: Secondary | ICD-10-CM | POA: Diagnosis not present

## 2024-11-19 DIAGNOSIS — Z23 Encounter for immunization: Secondary | ICD-10-CM | POA: Diagnosis not present

## 2024-11-19 DIAGNOSIS — I82432 Acute embolism and thrombosis of left popliteal vein: Secondary | ICD-10-CM | POA: Diagnosis not present

## 2024-11-19 DIAGNOSIS — F322 Major depressive disorder, single episode, severe without psychotic features: Secondary | ICD-10-CM | POA: Diagnosis not present

## 2024-11-19 DIAGNOSIS — E7800 Pure hypercholesterolemia, unspecified: Secondary | ICD-10-CM | POA: Diagnosis not present

## 2024-11-19 DIAGNOSIS — K219 Gastro-esophageal reflux disease without esophagitis: Secondary | ICD-10-CM | POA: Diagnosis not present

## 2024-11-19 DIAGNOSIS — K5730 Diverticulosis of large intestine without perforation or abscess without bleeding: Secondary | ICD-10-CM | POA: Diagnosis not present

## 2024-11-19 DIAGNOSIS — I2699 Other pulmonary embolism without acute cor pulmonale: Secondary | ICD-10-CM | POA: Diagnosis not present
